# Patient Record
Sex: Male | Born: 1955
Health system: Southern US, Community
[De-identification: ages and names within clinical notes are randomized; demographics above are authoritative.]

## PROBLEM LIST (undated history)

## (undated) DIAGNOSIS — K5790 Diverticulosis of intestine, part unspecified, without perforation or abscess without bleeding: Secondary | ICD-10-CM

## (undated) DIAGNOSIS — S83231A Complex tear of medial meniscus, current injury, right knee, initial encounter: Secondary | ICD-10-CM

## (undated) DIAGNOSIS — E785 Hyperlipidemia, unspecified: Secondary | ICD-10-CM

## (undated) DIAGNOSIS — J449 Chronic obstructive pulmonary disease, unspecified: Secondary | ICD-10-CM

## (undated) DIAGNOSIS — I723 Aneurysm of iliac artery: Secondary | ICD-10-CM

## (undated) DIAGNOSIS — K5730 Diverticulosis of large intestine without perforation or abscess without bleeding: Secondary | ICD-10-CM

## (undated) DIAGNOSIS — K2270 Barrett's esophagus without dysplasia: Secondary | ICD-10-CM

## (undated) DIAGNOSIS — M5030 Other cervical disc degeneration, unspecified cervical region: Secondary | ICD-10-CM

## (undated) DIAGNOSIS — Z972 Presence of dental prosthetic device (complete) (partial): Secondary | ICD-10-CM

## (undated) DIAGNOSIS — K219 Gastro-esophageal reflux disease without esophagitis: Secondary | ICD-10-CM

## (undated) DIAGNOSIS — I739 Peripheral vascular disease, unspecified: Secondary | ICD-10-CM

## (undated) DIAGNOSIS — R0989 Other specified symptoms and signs involving the circulatory and respiratory systems: Secondary | ICD-10-CM

## (undated) DIAGNOSIS — R911 Solitary pulmonary nodule: Secondary | ICD-10-CM

## (undated) DIAGNOSIS — E881 Lipodystrophy, not elsewhere classified: Secondary | ICD-10-CM

## (undated) DIAGNOSIS — R7989 Other specified abnormal findings of blood chemistry: Secondary | ICD-10-CM

## (undated) DIAGNOSIS — K635 Polyp of colon: Secondary | ICD-10-CM

## (undated) DIAGNOSIS — I2510 Atherosclerotic heart disease of native coronary artery without angina pectoris: Secondary | ICD-10-CM

## (undated) DIAGNOSIS — N529 Male erectile dysfunction, unspecified: Secondary | ICD-10-CM

## (undated) DIAGNOSIS — E8819 Other lipodystrophy, not elsewhere classified: Secondary | ICD-10-CM

## (undated) DIAGNOSIS — Z9889 Other specified postprocedural states: Secondary | ICD-10-CM

## (undated) DIAGNOSIS — I7143 Infrarenal abdominal aortic aneurysm, without rupture: Secondary | ICD-10-CM

## (undated) DIAGNOSIS — I700 Atherosclerosis of aorta: Secondary | ICD-10-CM

## (undated) DIAGNOSIS — Z7982 Long term (current) use of aspirin: Secondary | ICD-10-CM

## (undated) DIAGNOSIS — M1990 Unspecified osteoarthritis, unspecified site: Secondary | ICD-10-CM

## (undated) DIAGNOSIS — Z860101 Personal history of adenomatous and serrated colon polyps: Secondary | ICD-10-CM

## (undated) DIAGNOSIS — Z8719 Personal history of other diseases of the digestive system: Secondary | ICD-10-CM

## (undated) DIAGNOSIS — I10 Essential (primary) hypertension: Secondary | ICD-10-CM

## (undated) DIAGNOSIS — I719 Aortic aneurysm of unspecified site, without rupture: Secondary | ICD-10-CM

## (undated) DIAGNOSIS — R112 Nausea with vomiting, unspecified: Secondary | ICD-10-CM

## (undated) DIAGNOSIS — R0609 Other forms of dyspnea: Secondary | ICD-10-CM

## (undated) DIAGNOSIS — K760 Fatty (change of) liver, not elsewhere classified: Secondary | ICD-10-CM

## (undated) DIAGNOSIS — I219 Acute myocardial infarction, unspecified: Secondary | ICD-10-CM

## (undated) DIAGNOSIS — T7840XA Allergy, unspecified, initial encounter: Secondary | ICD-10-CM

## (undated) DIAGNOSIS — S83271A Complex tear of lateral meniscus, current injury, right knee, initial encounter: Secondary | ICD-10-CM

## (undated) HISTORY — PX: GANGLION CYST EXCISION: SHX1691

## (undated) HISTORY — DX: Atherosclerotic heart disease of native coronary artery without angina pectoris: I25.10

## (undated) HISTORY — PX: APPENDECTOMY: SHX54

## (undated) HISTORY — PX: CARDIAC CATHETERIZATION: SHX172

## (undated) HISTORY — PX: CORONARY ANGIOPLASTY: SHX604

## (undated) HISTORY — PX: COLON SURGERY: SHX602

## (undated) HISTORY — DX: Other specified abnormal findings of blood chemistry: R79.89

## (undated) HISTORY — DX: Acute myocardial infarction, unspecified: I21.9

## (undated) HISTORY — PX: JOINT REPLACEMENT: SHX530

## (undated) HISTORY — PX: EYE SURGERY: SHX253

## (undated) HISTORY — PX: TONSILLECTOMY AND ADENOIDECTOMY: SUR1326

## (undated) HISTORY — DX: Allergy, unspecified, initial encounter: T78.40XA

---
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
## 2003-11-05 ENCOUNTER — Ambulatory Visit (HOSPITAL_COMMUNITY): Admission: RE | Admit: 2003-11-05 | Discharge: 2003-11-05 | Payer: Self-pay | Admitting: Neurosurgery

## 2003-11-09 DIAGNOSIS — M5080 Other cervical disc disorders, unspecified cervical region: Secondary | ICD-10-CM | POA: Insufficient documentation

## 2005-03-31 ENCOUNTER — Emergency Department: Payer: Self-pay | Admitting: Emergency Medicine

## 2005-10-08 DIAGNOSIS — E785 Hyperlipidemia, unspecified: Secondary | ICD-10-CM | POA: Insufficient documentation

## 2005-10-08 HISTORY — PX: CORONARY STENT PLACEMENT: SHX1402

## 2005-10-22 ENCOUNTER — Ambulatory Visit: Payer: Self-pay | Admitting: Chiropractic Medicine

## 2006-03-15 ENCOUNTER — Ambulatory Visit (HOSPITAL_COMMUNITY): Admission: RE | Admit: 2006-03-15 | Discharge: 2006-03-15 | Payer: Self-pay | Admitting: Neurosurgery

## 2006-08-08 DIAGNOSIS — I219 Acute myocardial infarction, unspecified: Secondary | ICD-10-CM

## 2006-08-08 HISTORY — DX: Acute myocardial infarction, unspecified: I21.9

## 2006-08-15 ENCOUNTER — Encounter: Payer: Self-pay | Admitting: Cardiovascular Disease

## 2006-08-15 DIAGNOSIS — I2119 ST elevation (STEMI) myocardial infarction involving other coronary artery of inferior wall: Secondary | ICD-10-CM

## 2006-08-15 HISTORY — DX: ST elevation (STEMI) myocardial infarction involving other coronary artery of inferior wall: I21.19

## 2006-08-22 ENCOUNTER — Encounter: Payer: Self-pay | Admitting: Cardiovascular Disease

## 2008-03-26 ENCOUNTER — Ambulatory Visit: Payer: Self-pay | Admitting: Unknown Physician Specialty

## 2009-01-31 ENCOUNTER — Encounter: Payer: Self-pay | Admitting: Cardiovascular Disease

## 2009-09-02 ENCOUNTER — Encounter: Payer: Self-pay | Admitting: Cardiovascular Disease

## 2009-09-19 ENCOUNTER — Encounter: Payer: Self-pay | Admitting: Cardiovascular Disease

## 2010-04-24 ENCOUNTER — Ambulatory Visit: Payer: Self-pay | Admitting: Gastroenterology

## 2010-04-26 LAB — PATHOLOGY REPORT

## 2010-05-10 ENCOUNTER — Other Ambulatory Visit: Payer: Self-pay | Admitting: Gastroenterology

## 2010-08-22 ENCOUNTER — Telehealth: Payer: Self-pay | Admitting: Cardiovascular Disease

## 2010-09-20 ENCOUNTER — Encounter: Payer: Self-pay | Admitting: Cardiovascular Disease

## 2010-09-27 ENCOUNTER — Ambulatory Visit: Payer: Self-pay | Admitting: Cardiovascular Disease

## 2010-09-27 ENCOUNTER — Encounter: Payer: Self-pay | Admitting: Cardiovascular Disease

## 2010-09-27 DIAGNOSIS — I700 Atherosclerosis of aorta: Secondary | ICD-10-CM | POA: Insufficient documentation

## 2010-09-27 DIAGNOSIS — R0989 Other specified symptoms and signs involving the circulatory and respiratory systems: Secondary | ICD-10-CM | POA: Insufficient documentation

## 2010-09-27 DIAGNOSIS — I2510 Atherosclerotic heart disease of native coronary artery without angina pectoris: Secondary | ICD-10-CM

## 2010-09-27 DIAGNOSIS — F17210 Nicotine dependence, cigarettes, uncomplicated: Secondary | ICD-10-CM

## 2010-09-27 DIAGNOSIS — I739 Peripheral vascular disease, unspecified: Secondary | ICD-10-CM

## 2010-09-27 DIAGNOSIS — E785 Hyperlipidemia, unspecified: Secondary | ICD-10-CM

## 2010-09-28 ENCOUNTER — Telehealth: Payer: Self-pay | Admitting: Cardiovascular Disease

## 2010-10-24 ENCOUNTER — Encounter: Payer: Self-pay | Admitting: Cardiovascular Disease

## 2010-10-24 ENCOUNTER — Ambulatory Visit: Admission: RE | Admit: 2010-10-24 | Discharge: 2010-10-24 | Payer: Self-pay | Source: Home / Self Care

## 2010-11-07 NOTE — Progress Notes (Signed)
Summary: RX  Phone Note Refill Request Call back at Home Phone [phone] Message from:  Patient on August 22, 2010 2:40 PM  Refills Requested: Medication #1:  LISINOPRIL 10 MG I TABLET I TIME A DAY [facility]  Initial call taken by: Harlon Flor,  August 22, 2010 2:40 PM    New/Updated Medications: LISINOPRIL 10 MG TABS (LISINOPRIL) one tablet once daily Prescriptions: LISINOPRIL 10 MG TABS (LISINOPRIL) one tablet once daily  #30 x 3   Entered by:   Bishop Dublin, CMA   Authorized by:   Dossie Arbour MD   Signed by:   Bishop Dublin, CMA on 08/22/2010   Method used:   Electronically to        [facility] [address] (retail)       [address]       [place], [place]  [address]       Ph: [phone]       Fax: [phone]   RxID:   [id number]

## 2010-11-09 NOTE — Assessment & Plan Note (Signed)
Summary: NP6/AMD   Visit Type:  Initial Consult Primary Antwoine Zorn:  Dr. Mila Merry  CC:  "doing well" denies chest pain and SOB.  History of Present Illness: Mr. Patrick Barrera is a 55 year old gentleman with a HTN, history of coronary artery disease, occlusion of his RCA in November 2007 with 2 bare-metal stents placed at that time also with 70% mid LAD lesion that was not intervened upon, long smoking history, hyperlipidemia who presents for routine followup.  he has not been taking simvastatin for uncertain reasons. He continues to smoke. He denies any significant chest pain or shortness of breath. He does not do any exercise.  EKG shows normal sinus rhythm with rate 60 beats per minute, no significant ST or T wave changes  Preventive Screening-Counseling & Management  Alcohol-Tobacco     Smoking Status: current  Caffeine-Diet-Exercise     Does Patient Exercise: no      Drug Use:  no.    Current Medications (verified): 1)  Lisinopril 10 Mg Tabs (Lisinopril) .... One Tablet Once Daily 2)  Plavix 75 Mg Tabs (Clopidogrel Bisulfate) .Marland Kitchen.. 1 Tablet Once Daily 3)  Nexium 40 Mg Cpdr (Esomeprazole Magnesium) .... One Tablet Once Daily 4)  Aspir-Low 81 Mg Tbec (Aspirin) .... One Tablet Once Daily 5)  Metoprolol Succinate 50 Mg Xr24h-Tab (Metoprolol Succinate) .Marland Kitchen.. 1 Tablet Daily 6)  Niacin 500 Mg Tabs (Niacin) .Marland Kitchen.. 1 Tablet Once Daily 7)  Lisinopril 10 Mg Tabs (Lisinopril) .Marland Kitchen.. 1 Tablet Once Daily 8)  Asacol Hd 800 Mg Tbec (Mesalamine) .Marland Kitchen.. 1 Tablet Three Times A Day  Allergies (verified): No Known Drug Allergies  Past History:  Past Medical History: CAD; MI with occlusion of his RCA in November 2007; stents x 2. Low cholesterol  Past Surgical History: CAD; Stents x 2 2007  Family History: Father:Heart disease-stents placed Mother:good health  Social History: Full Time Married  Tobacco Use - Yes-1 ppd Alcohol Use - yes-occasional Regular Exercise - no Drug Use - no Smoking  Status:  current Does Patient Exercise:  no Drug Use:  no  Review of Systems  The patient denies fever, weight loss, weight gain, vision loss, decreased hearing, hoarseness, chest pain, syncope, dyspnea on exertion, peripheral edema, prolonged cough, abdominal pain, incontinence, muscle weakness, depression, and enlarged lymph nodes.    Vital Signs:  Patient profile:   55 year old male Height:      70 inches Weight:      187.75 pounds BMI:     27.04 Pulse rate:   60 / minute BP sitting:   124 / 68  (left arm) Cuff size:   regular  Vitals Entered By: Lysbeth Galas CMA (September 27, 2010 3:54 PM)  Physical Exam  General:  Well developed, well nourished, in no acute distress.smells like smoke Head:  normocephalic and atraumatic Neck:  Neck supple, no JVD. No masses, thyromegaly or abnormal cervical nodes. Lungs:  Clear bilaterally to auscultation and percussion. Heart:  Non-displaced PMI, chest non-tender; regular rate and rhythm, S1, S2 without murmurs, rubs or gallops. Carotid upstroke normal, no bruit. mildly dilated abdominal aortic size, soft bruit. Pedals normal pulses. No edema, no varicosities. Abdomen:  Bowel sounds positive; abdomen soft and non-tender without masses Msk:  Back normal, normal gait. Muscle strength and tone normal. Pulses:  pulses normal in all 4 extremities Extremities:  No clubbing or cyanosis. Neurologic:  Alert and oriented x 3. Skin:  Intact without lesions or rashes. Psych:  Normal affect.   Impression & Recommendations:  Problem #  1:  CAD, NATIVE VESSEL (ICD-414.01) no symptoms of angina at this time. He is at high risk of recurrent coronary disease given that he continues to smoke, is not taking his cholesterol medication.  His updated medication list for this problem includes:    Lisinopril 10 Mg Tabs (Lisinopril) ..... One tablet once daily    Plavix 75 Mg Tabs (Clopidogrel bisulfate) .Marland Kitchen... 1 tablet once daily    Aspir-low 81 Mg Tbec  (Aspirin) ..... One tablet once daily    Metoprolol Succinate 50 Mg Xr24h-tab (Metoprolol succinate) .Marland Kitchen... 1 tablet daily    Lisinopril 10 Mg Tabs (Lisinopril) .Marland Kitchen... 1 tablet once daily  Problem # 2:  HYPERLIPIDEMIA-MIXED (ICD-272.4) We will start him on Vytorin 10/40 mg daily. Check his cholesterol in 3 months time.  The following medications were removed from the medication list:    Simvastatin 80 Mg Tabs (Simvastatin) ..... One tablet at bedtime His updated medication list for this problem includes:    Niacin 500 Mg Tabs (Niacin) .Marland Kitchen... 1 tablet once daily    Vytorin 10-40 Mg Tabs (Ezetimibe-simvastatin) .Marland Kitchen... Take one tablet by mouth daily at bedtime  Problem # 3:  SMOKER (ICD-305.1) We have counseled him on smoking. Have given him a prescription for Chantix..  Problem # 4:  ABDOMINAL BRUIT (ICD-785.9) soft abdominal bruit, oddly widened. We will order a abdominal ultrasound. He is at high risk of aneurysm given his long smoking history.  Orders: Abdominal Aorta Duplex (Abd Aorta Duplex)  Patient Instructions: 1)  Your physician recommends that you schedule a follow-up appointment in:1 year 2)  Your physician has recommended you make the following change in your medication: START Vytorin 10/40mg  once daily. 3)  Your physician has requested that you have an abdominal aorta duplex. During this test, an ultrasound is used to evaluate the aorta. Allow 30 minutes for this exam. Do not eat after midnight the day before and avoid carbonated beverages. There are no restrictions or special instructions. 4)  Your physician recommends that you return for a FASTING lipid profile: 3 months (lipid/lft's) Prescriptions: VYTORIN 10-40 MG TABS (EZETIMIBE-SIMVASTATIN) Take one tablet by mouth daily at bedtime  #30 x 6   Entered by:   Lanny Hurst RN   Authorized by:   Dossie Arbour MD   Signed by:   Lanny Hurst RN on 09/27/2010   Method used:   Electronically to        [id number]  (retail[facility]       [address]       [place], [place]  [address]       Ph: [phone] or [phone]       Fax: [phone]   RxID:   [id number] CHANTIX STARTING MONTH PAK 0.5 MG X 11 & 1 MG X 42 TABS (VARENICLINE TARTRATE) Take as directed  #1 x 0   Entered by:   Lanny Hurst RN   Authorized by:   Dossie Arbour MD   Signed by:   Lanny Hurst RN on 09/27/2010   Method used:   [facility]ically to        [id number] (retail[facility]       [address]       [place], [place]  [address]       Ph: [phone] or [phone]       Fax: [phone]  RxID:   [id number]

## 2010-11-09 NOTE — Letter (Signed)
Summary: Medical Record Release  Medical Record Release   Imported By: Harlon Flor 09/27/2010 15:59:11  _____________________________________________________________________  External Attachment:    Type:   Image     Comment:   External Document

## 2010-11-09 NOTE — Progress Notes (Signed)
Summary: RX  Phone Note Refill Request Call back at Home Phone [phone] Call back at [phone] Message from:  Patient on September 28, 2010 10:08 AM  Refills Requested: Medication #1:  METOPROLOL SUCCINATE 50 MG XR24H-TAB 1 tablet daily   Notes: 90 DAY SUPPLY MAIL ORDER  Medication #2:  PLAVIX 75 MG TABS 1 tablet once daily   Notes: 90 DAY SUPPLY MAIL ORDER  Medication #3:  NEXIUM 40 MG CPDR one tablet once daily   Notes: 90 DAY SUPPLY MAIL ORDER  Medication #4:  LISINOPRIL 10 MG TABS 1 tablet once daily   Notes: 90 DAY SUPPLY MAIL ORDER Please mail the RX to the pt's house.  Please make each RX separate.  Would like 2 30-day supplies of [facility] [place] b/c the pt is almost out.  Initial call taken by: Harlon Flor,  September 28, 2010 10:11 AM    Prescriptions: NEXIUM 40 MG CPDR (ESOMEPRAZOLE MAGNESIUM) one tablet once daily  #30 x 1   Entered by:   Bishop Dublin, CMA   Authorized by:   Dossie Arbour MD   Signed by:   Bishop Dublin, CMA on 09/28/2010   Method used:   [facility]        [id number] ([facility]       [address]       [place], [place]  [address]       Ph: [phone] or [phone]       Fax: [phone]   RxID:   [id number] LISINOPRIL 10 MG TABS (LISINOPRIL) one tablet once daily  #90 x 3   Entered by:   Bishop Dublin, CMA   Authorized by:   Dossie Arbour MD   Signed by:   Bishop Dublin, CMA on 09/28/2010   Method used:   Print then Give to Patient   RxID:   [id number] PLAVIX 75 MG TABS (CLOPIDOGREL BISULFATE) 1 tablet once daily  #90 x 3   Entered by:   Bishop Dublin, CMA   Authorized by:   Dossie Arbour MD   Signed by:   Bishop Dublin, CMA on 09/28/2010   Method used:   Print then Give to Patient   RxID:   [id number] NEXIUM 40 MG CPDR (ESOMEPRAZOLE MAGNESIUM) one tablet once daily  #90 x 3   Entered by:   Bishop Dublin, CMA   Authorized by:   Dossie Arbour MD   Signed by:    Bishop Dublin, CMA on 09/28/2010   Method used:   Print then Give to Patient   RxID:   [id number] METOPROLOL SUCCINATE 50 MG XR24H-TAB (METOPROLOL SUCCINATE) 1 tablet daily  #90 x 3   Entered by:   Bishop Dublin, CMA   Authorized by:   Dossie Arbour MD   Signed by:   Bishop Dublin, CMA on 09/28/2010   Method used:   Print then Give to Patient   RxID:   [id number]

## 2010-11-23 NOTE — Letter (Signed)
Summary: [facility] Office Note   [facility] Office Note   Imported By: Roderic Ovens 11/13/2010 10:47:01  _____________________________________________________________________  External Attachment:    Type:   Image     Comment:   External Document

## 2010-11-23 NOTE — Letter (Signed)
Summary: [facility] Office Note   [facility] Office Note   Imported By: Roderic Ovens 11/13/2010 10:46:23  _____________________________________________________________________  External Attachment:    Type:   Image     Comment:   External Document

## 2010-11-23 NOTE — Letter (Signed)
Summary: Echo Report   Echo Report   Imported By: Roderic Ovens 11/14/2010 13:04:03  _____________________________________________________________________  External Attachment:    Type:   Image     Comment:   External Document

## 2010-11-23 NOTE — Letter (Signed)
Summary: [facility]   Imported By: Roderic Ovens 11/14/2010 13:03:08  _____________________________________________________________________  External Attachment:    Type:   Image     Comment:   External Document

## 2010-11-29 NOTE — Cardiovascular Report (Signed)
[facility]: [facility]   Imported By: Marylou Mccoy 11/21/2010 14:29:28  _____________________________________________________________________  External Attachment:    Type:   Image     Comment:   External Document

## 2010-12-14 ENCOUNTER — Telehealth: Payer: Self-pay | Admitting: Cardiovascular Disease

## 2010-12-14 NOTE — Letter (Signed)
Summary: [facility]: Notice of Privacy Practices  [facility]: Notice of Privacy Practices   Imported By: Earl Many 12/08/2010 10:24:23  _____________________________________________________________________  External Attachment:    Type:   Image     Comment:   External Document

## 2010-12-14 NOTE — Progress Notes (Signed)
Summary: [facility]: Office Visit  [facility]: Office Visit   Imported By: Earl Many 12/08/2010 10:22:07  _____________________________________________________________________  External Attachment:    Type:   Image     Comment:   External Document

## 2010-12-26 NOTE — Progress Notes (Signed)
Summary: Drug interaction  Phone Note From Pharmacy Call back at [phone]   Caller: Ref # [phone] @ [facility] Call For: [facility]  Summary of Call: Drug interaction b/w Nexium and Plavix Initial call taken by: Harlon Flor,  December 14, 2010 10:23 AM  Follow-up for Phone Call        Pt's wife is calling regarding this medication.  Pt is almost out of his Nexium.  Can the pt get samples in the meantime? Follow-up by: Harlon Flor,  December 15, 2010 9:01 AM  Additional Follow-up for Phone Call Additional follow up Details #1::        LMOM TCB. Prevacid, protonix are the preferred drugs for drug interaction.  Told [facility] pharmacy patient should continue on the two medications unless Dr. Mariah Milling makes any changes. Additional Follow-up by: Bishop Dublin, CMA,  December 15, 2010 9:22 AM    Additional Follow-up for Phone Call Additional follow up Details #2::    would prefer nexium over prevacid. Stents are 57 years old, so not a problem either way,. If he is very concerned, he could change plavix to effient OK to leave as it is   Appended Document: Drug interaction patient aware and will continue with medications the way they are already.

## 2010-12-27 ENCOUNTER — Ambulatory Visit (INDEPENDENT_AMBULATORY_CARE_PROVIDER_SITE_OTHER): Payer: BC Managed Care – PPO | Admitting: *Deleted

## 2010-12-27 ENCOUNTER — Other Ambulatory Visit: Payer: Self-pay | Admitting: Cardiovascular Disease

## 2010-12-27 DIAGNOSIS — E785 Hyperlipidemia, unspecified: Secondary | ICD-10-CM

## 2010-12-27 LAB — HEPATIC FUNCTION PANEL
ALT: 26 U/L (ref 0–53)
AST: 19 U/L (ref 0–37)
Alkaline Phosphatase: 77 U/L (ref 39–117)
Indirect Bilirubin: 0.5 mg/dL (ref 0.0–0.9)
Total Protein: 7 g/dL (ref 6.0–8.3)

## 2010-12-27 LAB — LIPID PANEL
Cholesterol: 138 mg/dL (ref 0–200)
Triglycerides: 281 mg/dL — ABNORMAL HIGH (ref <150)

## 2010-12-28 ENCOUNTER — Encounter: Payer: Self-pay | Admitting: Cardiovascular Disease

## 2011-02-23 NOTE — Op Note (Signed)
NAME:  Patrick Barrera, Patrick Barrera                            ACCOUNT NO.:  [id number]   MEDICAL RECORD NO.:  [id number]                   PATIENT TYPE:  OIB   LOCATION:  3009                                 FACILITY:  [facility]   PHYSICIAN:  Kathaleen Maser. Pool, M.D.                 DATE OF BIRTH:  October 19, 1955   DATE OF PROCEDURE:  11/05/2003  DATE OF DISCHARGE:                                 OPERATIVE REPORT   SERVICE:  Neurosurgery.   PREOPERATIVE DIAGNOSIS:  Right C5-C6 and right C6-C7 spondylosis with  radiculopathy.   POSTOPERATIVE DIAGNOSIS:  Right C5-C6 and right C6-C7 spondylosis with  radiculopathy.   PROCEDURE PERFORMED:  C5-C6 and C6-C7 anterior discectomy and fusion with  allograft anterior plating.   SURGEON:  Kathaleen Maser. Pool, M.D.   ASSISTANT:  Tia Alert, MD   ANESTHESIA:  General endotracheal anesthesia.   INDICATIONS FOR PROCEDURE:  Mr. Bellucci is a 55 year old male with a history of  neck and right upper extremity pain, paresthesia, and weakness, consistent  with both right sided C6 and C7 radiculopathy.  The patient has failed  conservative management.  Workup demonstrates evidence of significant  spondylosis with superimposed disc herniation at C5-C6 and C6-C7 causing  marked compression of the exiting C6 and C7 nerve roots respectively.  The  patient has failed conservative management and it was decided to proceed  with C5-C6 and C6-C7 anterior cervical discectomy and fusion with allograft  and anterior plating.   PROCEDURE:  The patient was brought to the operating room and placed on the  operating table in the supine position.  After an adequate level of  anesthesia was achieved, the patient was positioned supine with his neck  slightly extended and held in place with the halter traction.  The patient's  anterior cervical region was prepped and draped sterilely.  A 10 blade was  used to make a linear incision overlying the C6 vertebral level.  This was  carried down sharply  to the platysma.  The platysma was divided vertically  and dissection proceeded along the medial border of the sternocleidomastoid  muscle and carotid sheath.  The trachea and esophagus were mobilized and  retracted towards the left.  The prevertebral fascia was stripped off the  anterior spinal column.  The longus colli muscles were elevated bilaterally  using electrocautery.  Deep self-retaining retractor was placed.  Interoperative fluoroscopy was used and the level was confirmed.   The disc space at C5-C6 and C6-C7 were then incised with a 15 blade.  A wide  disc space plane was then achieved at both levels using pituitary rongeurs,  forward and back Carlens curets, Kerrison rongeurs, and a high speed drill.  All elements of the disc were removed down to the posterior annulus.  Starting first at C5-C6, the microscope was brought onto the field and used  throughout the remainder of the discectomy.  The remaining aspects of the  annulus and osteophytes were removed down to the level of the posterior  longitudinal ligament.  The posterior longitudinal ligament was then  elevated and resected in a piecemeal fashion using Kerrison rongeurs.  The  underlying thecal sac was identified.  A wide central decompression was then  performed by under cutting the bodies of C5 and C6.  Decompression then  proceeded into each neural foramen.  Wide anterior foraminotomies were then  performed along the course of the exiting C6 nerve roots bilaterally.  All  elements of the disc herniation and spondylitic protrusions were removed  along this way.  At this point, a very thorough decompression had been  achieved.  There was no evidence of any compression of the thecal sac or  nerve roots.  The wound was then irrigated with antibiotic solution.  The  decompression was then completed at C6-C7 in a similar fashion, again,  without complications.   The wound was then irrigated with antibiotic solution one  final time.  A 7  mm patellar wedge allograft was then impacted into place and recessed  approximately 1 mm from the anterior cortical margin at both levels.  A 45  mm Atlantis anterior cervical plate was then placed over the C5, C6, and C7  levels.  This was then attached under fluoroscopic guidance with 14 mm  variable angle screws, two each at all three levels.  Final images revealed  good position of the bone grafts and hardware.  The wound was then irrigated  with antibiotic solution and then closed in typical fashion.  Steri-Strips  and sterile dressing were applied.  There were no complications.  The  patient tolerated the procedure well and he was returned to the recovery  room postoperatively.                                               Henry A. Pool, M.D.    HAP/MEDQ  D:  11/05/2003  T:  11/05/2003  Job:  [id number]

## 2011-02-23 NOTE — Op Note (Signed)
NAME:  Patrick Barrera, Patrick Barrera                  ACCOUNT NO.:  [id number]   MEDICAL RECORD NO.:  [id number]          PATIENT TYPE:  AMB   LOCATION:  SDS                          FACILITY:  [facility]   PHYSICIAN:  Henry A. Pool, M.D.    DATE OF BIRTH:  09/13/1956   DATE OF PROCEDURE:  03/15/2006  DATE OF DISCHARGE:                                 OPERATIVE REPORT   PREOPERATIVE DIAGNOSIS:  Left L4-L5 stenosis and left L5-S1 stenosis.   POSTOPERATIVE DIAGNOSIS:  Left L4-L5 stenosis and left L5-S1 stenosis.   PROCEDURE NOTE:  Left L4-L5 decompressive laminotomy and foraminotomy, left  L5-S1 decompressive laminotomy and foraminotomy.   SURGEON:  Kathaleen Maser. Pool, M.D.   ASSISTANT:  Donalee Citrin, M.D.   ANESTHESIA:  General endotracheal anesthesia.   PREMEDICATION:  [name] is a 55 year old male with a history of left lower  extremity pain, paresthesias, and weakness consistent with a left-sided L5  radiculopathy with some elements of a left-sided S1 radiculopathy, as well.  Workup demonstrates evidence of significant spondylosis with stenosis at L4-  L5 and L5-S1, worse on the left side.  The patient presents now for two  level decompressive laminotomy and foraminotomy in hopes of improving his  symptoms.   OPERATIVE NOTE:  The patient was taken to the operating room and placed on  the operating table in the supine position. After anesthesia was achieved,  the patient was positioned prone onto Wilson frame and appropriately padded  for an operation in the lumbar region.  The patient was prepped and draped  sterilely.  A 10 blade was used to make a linear skin incision overlying the  L4-L5 interspace.  This carried sharply down sharply in the midline.  A  subperiosteal dissection was then performed exposing the lamina and facet  joints of L4, L5, and S1.  Deep self-retaining retractor was placed.  Interoperative x-ray was taken and levels were confirmed.  Decompressive  laminotomies were then performed  using a high-speed drill and Kerrison  rongeurs to remove the inferior aspect of the lamina of L4, medial aspect of  the L4-L5 facet joint, the entire left hemilamina of L5, and the superior  aspect of the lamina of S1.  The ligamentum flavum was then elevated and  resected in a piecemeal fashion using Kerrison rongeurs at both levels.  The  underlying thecal sac and exiting L5 and S1 nerve roots were identified.  Wide decompressive foraminotomy was then performed along the course of the  exiting L5 and S1 nerve roots using the operating microscope for  microdissection.  The L5 nerve root was decompressed distally into its  foramen.  A blunt probe was passed along the course of the exiting nerve  roots.  There is no evidence of any residual compression.  There was some  osteophytic spurring coming from the disk space at L5-S1 which was resected  with an osteophyte remover.  The disk space itself was very collapsed and no  aggressive diskectomy was performed at this level.  The wound was then  irrigated with antibiotic solution.  Gelfoam was  placed topically for  hemostasis which was found to be good.  The microscope and retractor system  were removed.  Hemostasis was obtained in the muscle with  electrocautery.  The wound was then closed in layers with Vicryl sutures.  Steri-Strips and sterile dressing were applied.  There were no apparent  complications.  The patient did well and he returns to the recovery room  postoperatively.           ______________________________  Kathaleen Maser Pool, M.D.     HAP/MEDQ  D:  03/15/2006  T:  03/15/2006  Job:  [id number]

## 2011-04-24 ENCOUNTER — Encounter: Payer: Self-pay | Admitting: Cardiovascular Disease

## 2011-06-05 ENCOUNTER — Telehealth: Payer: Self-pay

## 2011-06-05 MED ORDER — CLOPIDOGREL BISULFATE 75 MG PO TABS: 75.0000 mg | ORAL_TABLET | Freq: Every day | ORAL | Status: DC

## 2011-06-05 NOTE — Telephone Encounter (Signed)
Needs a refill sent to [facility] mail order for plavix 75 mg take one tablet daily.

## 2011-07-17 ENCOUNTER — Other Ambulatory Visit: Payer: Self-pay | Admitting: Cardiovascular Disease

## 2011-07-17 ENCOUNTER — Telehealth: Payer: Self-pay | Admitting: *Deleted

## 2011-07-17 DIAGNOSIS — E785 Hyperlipidemia, unspecified: Secondary | ICD-10-CM

## 2011-07-17 NOTE — Telephone Encounter (Signed)
Pt's wife calling to see if pt needs refill of his simvastatin since he will f/u with TG in 09/2011 and will need lipid panel. (Pt has not been taking simva or vytorin for >3 months due to leg cramps and I assume wants him on med for lab results only). I advised that pt have labs done now, since he has been off of chol med, and if he cannot tolerate statin, we will have TG review and see what med/dose would be recommended. Pt has never tried pravastatin. Pt will call back to schedule labs for next week.

## 2011-07-26 ENCOUNTER — Ambulatory Visit (INDEPENDENT_AMBULATORY_CARE_PROVIDER_SITE_OTHER): Payer: BC Managed Care – PPO | Admitting: *Deleted

## 2011-07-26 ENCOUNTER — Ambulatory Visit: Payer: BC Managed Care – PPO | Admitting: *Deleted

## 2011-07-26 DIAGNOSIS — E785 Hyperlipidemia, unspecified: Secondary | ICD-10-CM

## 2011-07-27 LAB — HEPATIC FUNCTION PANEL
ALT: 25 IU/L (ref 0–55)
AST: 23 IU/L (ref 0–40)
Total Bilirubin: 0.4 mg/dL (ref 0.0–1.2)

## 2011-07-27 LAB — LIPID PANEL
Cholesterol, Total: 231 mg/dL — ABNORMAL HIGH (ref 100–199)
HDL: 25 mg/dL — ABNORMAL LOW (ref >39)
Triglycerides: 299 mg/dL — ABNORMAL HIGH (ref 0–149)

## 2011-08-02 ENCOUNTER — Encounter: Payer: Self-pay | Admitting: Cardiovascular Disease

## 2011-08-08 ENCOUNTER — Telehealth: Payer: Self-pay | Admitting: *Deleted

## 2011-08-08 NOTE — Telephone Encounter (Signed)
Pt calling to get his lab results from 10/18, I saw a letter was sent but he has not received yet. Pt needs to start chol med in addition to his niacin 500mg . Pt has taken crestor, simva, and lipitor which all caused leg cramps. Do you want him to start pravastatin? This seem to be the only med he has not taken. Pt does have f/u with you 09/2011.

## 2011-08-09 NOTE — Telephone Encounter (Signed)
Would start fluvastatin 40 mg daily Probably not stong enough, but we can add zetia later

## 2011-08-13 MED ORDER — FLUVASTATIN SODIUM 40 MG PO CAPS: 40.0000 mg | ORAL_CAPSULE | Freq: Every day | ORAL | Status: DC

## 2011-08-13 NOTE — Telephone Encounter (Signed)
Attempted to contact pt, LMOM TCB.  

## 2011-08-13 NOTE — Telephone Encounter (Signed)
Spoke to pt, notified of below. Fluvastatin 40mg  sent in, pt will start taking and he has f/u with TG 09/10/11. Pt knows to call if he has any side effects.

## 2011-09-10 ENCOUNTER — Ambulatory Visit (INDEPENDENT_AMBULATORY_CARE_PROVIDER_SITE_OTHER): Payer: BC Managed Care – PPO | Admitting: Cardiovascular Disease

## 2011-09-10 ENCOUNTER — Encounter: Payer: Self-pay | Admitting: Cardiovascular Disease

## 2011-09-10 VITALS — BP 122/80 | HR 68 | Ht 70.0 in | Wt 197.0 lb

## 2011-09-10 DIAGNOSIS — I10 Essential (primary) hypertension: Secondary | ICD-10-CM

## 2011-09-10 DIAGNOSIS — F17200 Nicotine dependence, unspecified, uncomplicated: Secondary | ICD-10-CM

## 2011-09-10 DIAGNOSIS — I2510 Atherosclerotic heart disease of native coronary artery without angina pectoris: Secondary | ICD-10-CM

## 2011-09-10 DIAGNOSIS — I700 Atherosclerosis of aorta: Secondary | ICD-10-CM

## 2011-09-10 DIAGNOSIS — IMO0001 Reserved for inherently not codable concepts without codable children: Secondary | ICD-10-CM

## 2011-09-10 DIAGNOSIS — E785 Hyperlipidemia, unspecified: Secondary | ICD-10-CM

## 2011-09-10 MED ORDER — FLUVASTATIN SODIUM 40 MG PO CAPS: 40.0000 mg | ORAL_CAPSULE | Freq: Every day | ORAL | Status: DC

## 2011-09-10 MED ORDER — VARENICLINE TARTRATE 0.5 MG PO TABS: 0.5000 mg | ORAL_TABLET | Freq: Two times a day (BID) | ORAL | Status: AC

## 2011-09-10 NOTE — Assessment & Plan Note (Signed)
Poorly controlled cholesterol secondary to statin intolerance. He is so far tolerating fluvastatin.

## 2011-09-10 NOTE — Progress Notes (Signed)
Patient ID: Patrick Barrera, male    DOB: 1956/05/17, 55 y.o.   MRN: [id number]  HPI Comments: Patrick Barrera is a 55 year old gentleman with a HTN, history of coronary artery disease, occlusion of his RCA in November 2007 with 2 bare-metal stents placed at that time also with 70% mid LAD lesion that was not intervened upon, long smoking history, hyperlipidemia who presents for routine followup.  He reports that he continues to smoke. He is trying to quit. He denies any chest pain, shortness of breath. He does have some mild swelling in his legs particularly after a long day of standing. He was not able to tolerate simvastatin and went back on a statin, fluvastatin, in the past 5 weeks. Weight has been relatively stable He does not do any exercise.   EKG shows normal sinus rhythm with rate 70 beats per minute, no significant ST or T wave changes     Outpatient Encounter Prescriptions as of 09/10/2011  Medication Sig Dispense Refill  . aspirin 81 MG EC tablet Take 81 mg by mouth daily.        . clopidogrel (PLAVIX) 75 MG tablet Take 1 tablet (75 mg total) by mouth daily.  90 tablet  3  . esomeprazole (NEXIUM) 40 MG capsule Take 40 mg by mouth daily.        . fluvastatin (LESCOL) 40 MG capsule Take 1 capsule (40 mg total) by mouth at bedtime.  90 capsule  4  . lisinopril (PRINIVIL,ZESTRIL) 10 MG tablet Take 10 mg by mouth daily.        . metoprolol (TOPROL-XL) 50 MG 24 hr tablet Take 50 mg by mouth daily.        . niacin 500 MG tablet Take 500 mg by mouth daily.          Review of Systems  Constitutional: Negative.   HENT: Negative.   Eyes: Negative.   Respiratory: Negative.   Cardiovascular: Negative.   Gastrointestinal: Negative.   Musculoskeletal: Negative.   Skin: Negative.   Neurological: Negative.   Hematological: Negative.   Psychiatric/Behavioral: Negative.   All other systems reviewed and are negative.    BP 122/80  Pulse 68  Ht 5\' 10"  (1.778 m)  Wt 197 lb (89.359 kg)  BMI 28.27  kg/m2   Physical Exam  Nursing note and vitals reviewed. Constitutional: He is oriented to person, place, and time. He appears well-developed and well-nourished.  HENT:  Head: Normocephalic.  Nose: Nose normal.  Mouth/Throat: Oropharynx is clear and moist.  Eyes: Conjunctivae are normal. Pupils are equal, round, and reactive to light.  Neck: Normal range of motion. Neck supple. No JVD present.  Cardiovascular: Normal rate, regular rhythm, S1 normal, S2 normal, normal heart sounds and intact distal pulses.  Exam reveals no gallop and no friction rub.   No murmur heard.      Mild nonpitting edema worse on the right than the left  Pulmonary/Chest: Effort normal and breath sounds normal. No respiratory distress. He has no wheezes. He has no rales. He exhibits no tenderness.  Abdominal: Soft. Bowel sounds are normal. He exhibits no distension. There is no tenderness.  Musculoskeletal: Normal range of motion. He exhibits no edema and no tenderness.  Lymphadenopathy:    He has no cervical adenopathy.  Neurological: He is alert and oriented to person, place, and time. Coordination normal.  Skin: Skin is warm and dry. No rash noted. No erythema.  Psychiatric: He has a normal mood and affect. His  behavior is normal. Judgment and thought content normal.           Assessment and Plan

## 2011-09-10 NOTE — Assessment & Plan Note (Signed)
We have encouraged him to continue to work on weaning his cigarettes and smoking cessation. He will continue to work on this and  wants chantix.

## 2011-09-10 NOTE — Assessment & Plan Note (Signed)
Calcification noted on the balls of the aorta in January 2012. Continue smoking cessation and aggressive cholesterol management.

## 2011-09-10 NOTE — Assessment & Plan Note (Signed)
Blood pressure is well controlled on today's visit. No changes made to the medications. 

## 2011-09-10 NOTE — Patient Instructions (Addendum)
You are doing well. No medication changes were made.  We should check a lipid panel in 3 months.   Please call us if you have new issues that need to be addressed before your next appt.  The office will contact you for a follow up Appt. In 12 months

## 2011-09-10 NOTE — Assessment & Plan Note (Signed)
Currently with no symptoms of angina. No further workup at this time. Continue current medication regimen. 

## 2011-09-13 ENCOUNTER — Telehealth: Payer: Self-pay

## 2011-09-13 DIAGNOSIS — E785 Hyperlipidemia, unspecified: Secondary | ICD-10-CM

## 2011-09-13 DIAGNOSIS — I2510 Atherosclerotic heart disease of native coronary artery without angina pectoris: Secondary | ICD-10-CM

## 2011-09-13 MED ORDER — LISINOPRIL 10 MG PO TABS: 10.0000 mg | ORAL_TABLET | Freq: Every day | ORAL | Status: DC

## 2011-09-13 MED ORDER — ESOMEPRAZOLE MAGNESIUM 40 MG PO CPDR: 40.0000 mg | DELAYED_RELEASE_CAPSULE | Freq: Every day | ORAL | Status: DC

## 2011-09-13 MED ORDER — FLUVASTATIN SODIUM 40 MG PO CAPS: 40.0000 mg | ORAL_CAPSULE | Freq: Every day | ORAL | Status: DC

## 2011-09-13 MED ORDER — METOPROLOL SUCCINATE ER 50 MG PO TB24: 50.0000 mg | ORAL_TABLET | Freq: Every day | ORAL | Status: DC

## 2011-09-13 NOTE — Telephone Encounter (Signed)
Needed a 90 day supply sent to [facility] mail order for fluvastatin 40 mg.

## 2011-09-13 NOTE — Telephone Encounter (Signed)
Attempted to print Rx.

## 2011-12-10 ENCOUNTER — Other Ambulatory Visit: Payer: BC Managed Care – PPO

## 2011-12-10 ENCOUNTER — Other Ambulatory Visit: Payer: Self-pay | Admitting: Cardiovascular Disease

## 2011-12-12 LAB — LIPID PANEL W/O CHOL/HDL RATIO
LDL Calculated: 113 mg/dL — ABNORMAL HIGH (ref 0–99)
Triglycerides: 356 mg/dL — ABNORMAL HIGH (ref 0–149)

## 2011-12-28 ENCOUNTER — Telehealth: Payer: Self-pay | Admitting: *Deleted

## 2011-12-28 ENCOUNTER — Telehealth: Payer: Self-pay | Admitting: Cardiovascular Disease

## 2011-12-28 NOTE — Telephone Encounter (Signed)
Spoke with pt re/results. Pt willing to try other med but cost may be an issue. Pt was intolerant to Vytorin/ leg cramps. Please advise strength of crestor and lab redraw date.

## 2011-12-28 NOTE — Telephone Encounter (Signed)
Message copied by Antony Odea on Fri Dec 28, 2011  2:33 PM ------      Message from: Phoebe Sharps      Created: Fri Dec 28, 2011  1:56 PM       Cholesterol is too high. Slightly better than 5 months ago.      Done from 231 to 206      Goal is 150.      He is high risk of further coronary artery disease given smoking and high cholesterol.      Cholesterol was perfect one year ago on Vytorin.      As you want [place] to retry Vytorin, changed to high-dose Lipitor, Try Crestor?

## 2011-12-28 NOTE — Telephone Encounter (Signed)
Lets try Lipitor first Prescription for 80 mg daily, started on 40 mg for several weeks first before titrating upwards If able to tolerate 80 mg daily, check cholesterol in 3 months time with LFTs.

## 2011-12-28 NOTE — Telephone Encounter (Signed)
Error

## 2011-12-31 ENCOUNTER — Telehealth: Payer: Self-pay | Admitting: *Deleted

## 2011-12-31 DIAGNOSIS — E785 Hyperlipidemia, unspecified: Secondary | ICD-10-CM

## 2011-12-31 MED ORDER — ATORVASTATIN CALCIUM 80 MG PO TABS: ORAL_TABLET | ORAL | Status: DC

## 2011-12-31 NOTE — Telephone Encounter (Signed)
Pt agreeable to starting new med/ labs scheduled

## 2011-12-31 NOTE — Telephone Encounter (Signed)
Patient called no answer.LMTC. 

## 2012-01-14 NOTE — Progress Notes (Signed)
Would try atorvastatin 80 mg daily (already on med list?) Or crestor 40 mg daily

## 2012-01-15 ENCOUNTER — Telehealth: Payer: Self-pay | Admitting: *Deleted

## 2012-01-15 NOTE — Progress Notes (Signed)
lmtcb

## 2012-01-15 NOTE — Telephone Encounter (Signed)
PER PT  IS TOLERATING  ATORVASTATIN 80 MG AT THIS TIME, IS SCHEDULED  TO RETURN EARLY June FOR REPEAT LIPID AND LIVER  WILL CONT WITH  MED AND WILL CALL IF HAS PROBLEMS IN FUTURE. PER PT  LDL  FROM  FINGER STICK   WAS 47  DOWN FROM 113  FROM PREVIOUS LABS .Patrick Barrera

## 2012-01-15 NOTE — Telephone Encounter (Signed)
Message copied by Antony Odea on Tue Jan 15, 2012  3:52 PM ------      Message from: Phoebe Sharps      Created: Mon Jan 14, 2012 10:43 PM       Would try atorvastatin 80 mg dail (already on medication lsit?)      Or try crestor 40 mg daily      thx      Tim                  ----- Message -----         From: Antony Odea, RN         Sent: 12/28/2011   2:50 PM           To: Antonieta Iba, MD            Dr Mariah Milling to advise new medication to try, pt willing to try crestor if able to afford. Will send to dr Mariah Milling to advise strength

## 2012-01-15 NOTE — Telephone Encounter (Signed)
LMTCB

## 2012-01-15 NOTE — Telephone Encounter (Signed)
Fu call °Pt returning your call  °

## 2012-02-26 ENCOUNTER — Telehealth: Payer: Self-pay | Admitting: Cardiovascular Disease

## 2012-02-26 NOTE — Telephone Encounter (Signed)
Wife called me back. Says she spoke with [employer] and they advised her to contact pt.'s place of employment/HR and see about getting a letter sent to approve a 1x extension for statin med until he gets labs done. Wife will call me if employer needs [place] to send letter, etc. In the mean time, pt will try to come in soon for chol/labs.

## 2012-02-26 NOTE — Telephone Encounter (Signed)
Wife says she is on phone with [employer] re: meds at this time and will call me right back.

## 2012-02-26 NOTE — Telephone Encounter (Signed)
Pt wife called stating that pt only has 5 days left of atorvastin and wants to know if he is to cont this or if it will depend on his labs. Pt wife states that a 30 day supply at local pharmacy is $130. Pt would like to discuss this with Dr Mariah Milling

## 2012-03-14 ENCOUNTER — Ambulatory Visit (INDEPENDENT_AMBULATORY_CARE_PROVIDER_SITE_OTHER): Payer: BC Managed Care – PPO

## 2012-03-14 DIAGNOSIS — E785 Hyperlipidemia, unspecified: Secondary | ICD-10-CM

## 2012-03-14 DIAGNOSIS — Z79899 Other long term (current) drug therapy: Secondary | ICD-10-CM

## 2012-03-15 LAB — HEPATIC FUNCTION PANEL
ALT: 26 IU/L (ref 0–44)
AST: 18 IU/L (ref 0–40)
Alkaline Phosphatase: 77 IU/L (ref 25–150)
Bilirubin, Direct: 0.13 mg/dL (ref 0.00–0.40)
Total Bilirubin: 0.4 mg/dL (ref 0.0–1.2)

## 2012-03-15 LAB — BASIC METABOLIC PANEL
CO2: 19 mmol/L — ABNORMAL LOW (ref 20–32)
Calcium: 8.6 mg/dL — ABNORMAL LOW (ref 8.7–10.2)
Chloride: 108 mmol/L (ref 97–108)
Potassium: 4.2 mmol/L (ref 3.5–5.2)
Sodium: 138 mmol/L (ref 134–144)

## 2012-03-15 LAB — LIPID PANEL
Cholesterol, Total: 119 mg/dL (ref 100–199)
LDL Calculated: 64 mg/dL (ref 0–99)
Triglycerides: 150 mg/dL — ABNORMAL HIGH (ref 0–149)

## 2012-03-31 ENCOUNTER — Other Ambulatory Visit: Payer: Self-pay | Admitting: Cardiovascular Disease

## 2012-03-31 DIAGNOSIS — E785 Hyperlipidemia, unspecified: Secondary | ICD-10-CM

## 2012-03-31 MED ORDER — ATORVASTATIN CALCIUM 80 MG PO TABS: ORAL_TABLET | ORAL | Status: DC

## 2012-03-31 NOTE — Telephone Encounter (Signed)
Needs a script also called in to [facility] in [place].

## 2012-03-31 NOTE — Telephone Encounter (Signed)
Refilled Atorvastatin.

## 2012-05-12 ENCOUNTER — Telehealth: Payer: Self-pay

## 2012-05-12 MED ORDER — CLOPIDOGREL BISULFATE 75 MG PO TABS: 75.0000 mg | ORAL_TABLET | Freq: Every day | ORAL | Status: DC

## 2012-05-12 NOTE — Telephone Encounter (Signed)
Pt's wife says he needs refill of SL NTG 0.4 mg tabs i do not see record of pt taking these. Is is ok to prescribe?

## 2012-05-13 ENCOUNTER — Other Ambulatory Visit: Payer: Self-pay

## 2012-05-13 MED ORDER — NITROGLYCERIN 0.4 MG SL SUBL: 0.4000 mg | SUBLINGUAL_TABLET | SUBLINGUAL | Status: DC | PRN

## 2012-05-13 NOTE — Telephone Encounter (Signed)
Okay to refill nitroglycerin If he is having more chest pain, he should let us know

## 2012-09-10 ENCOUNTER — Encounter: Payer: Self-pay | Admitting: Cardiovascular Disease

## 2012-09-10 ENCOUNTER — Ambulatory Visit (INDEPENDENT_AMBULATORY_CARE_PROVIDER_SITE_OTHER): Payer: BC Managed Care – PPO | Admitting: Cardiovascular Disease

## 2012-09-10 VITALS — BP 140/78 | HR 60 | Ht 70.0 in | Wt 197.0 lb

## 2012-09-10 DIAGNOSIS — I2510 Atherosclerotic heart disease of native coronary artery without angina pectoris: Secondary | ICD-10-CM

## 2012-09-10 DIAGNOSIS — E785 Hyperlipidemia, unspecified: Secondary | ICD-10-CM

## 2012-09-10 DIAGNOSIS — F17200 Nicotine dependence, unspecified, uncomplicated: Secondary | ICD-10-CM

## 2012-09-10 DIAGNOSIS — I10 Essential (primary) hypertension: Secondary | ICD-10-CM

## 2012-09-10 MED ORDER — CLOPIDOGREL BISULFATE 75 MG PO TABS: 75.0000 mg | ORAL_TABLET | Freq: Every day | ORAL | Status: DC

## 2012-09-10 MED ORDER — METOPROLOL SUCCINATE ER 50 MG PO TB24: 50.0000 mg | ORAL_TABLET | Freq: Every day | ORAL | Status: DC

## 2012-09-10 MED ORDER — LISINOPRIL 10 MG PO TABS: 10.0000 mg | ORAL_TABLET | Freq: Every day | ORAL | Status: DC

## 2012-09-10 MED ORDER — ATORVASTATIN CALCIUM 80 MG PO TABS: 80.0000 mg | ORAL_TABLET | Freq: Every day | ORAL | Status: DC

## 2012-09-10 NOTE — Progress Notes (Signed)
Patient ID: Patrick Barrera, male    DOB: 02-13-1956, 56 y.o.   MRN: [id number]  HPI Comments: Patrick Barrera is a 56 year old gentleman with a HTN, history of coronary artery disease, occlusion of his RCA in November 2007 with 2 bare-metal stents placed at that time also with 70% mid LAD lesion that was not intervened upon, long smoking history who stopped in October 2013, hyperlipidemia who presents for routine followup.  He denies any chest pain, shortness of breath. He reports that he is able to tolerate generic Lipitor 80 mg daily. Rarely cramping.  Weight has been relatively stable. He does not do any exercise. He spends time with his grandkids   EKG shows normal sinus rhythm with rate 60 beats per minute, no significant ST or T wave changes     Outpatient Encounter Prescriptions as of 09/10/2012  Medication Sig Dispense Refill  . aspirin 81 MG EC tablet Take 81 mg by mouth daily.        Marland Kitchen atorvastatin (LIPITOR) 80 MG tablet Take 1 tablet (80 mg total) by mouth daily.  90 tablet  3  . clopidogrel (PLAVIX) 75 MG tablet Take 1 tablet (75 mg total) by mouth daily.  90 tablet  3  . esomeprazole (NEXIUM) 40 MG capsule Take 1 capsule (40 mg total) by mouth daily.  90 capsule  3  . lisinopril (PRINIVIL,ZESTRIL) 10 MG tablet Take 1 tablet (10 mg total) by mouth daily.  90 tablet  3  . metoprolol succinate (TOPROL-XL) 50 MG 24 hr tablet Take 1 tablet (50 mg total) by mouth daily.  90 tablet  3  . niacin 500 MG tablet Take 500 mg by mouth as needed.       . nitroGLYCERIN (NITROSTAT) 0.4 MG SL tablet Place 1 tablet (0.4 mg total) under the tongue every 5 (five) minutes as needed for chest pain.  25 tablet  3    Review of Systems  Constitutional: Negative.   HENT: Negative.   Eyes: Negative.   Respiratory: Negative.   Cardiovascular: Negative.   Gastrointestinal: Negative.   Musculoskeletal: Negative.   Skin: Negative.   Neurological: Negative.   Hematological: Negative.   Psychiatric/Behavioral:  Negative.   All other systems reviewed and are negative.    BP 140/78  Pulse 60  Ht 5\' 10"  (1.778 m)  Wt 197 lb (89.359 kg)  BMI 28.27 kg/m2  Physical Exam  Nursing note and vitals reviewed. Constitutional: He is oriented to person, place, and time. He appears well-developed and well-nourished.  HENT:  Head: Normocephalic.  Nose: Nose normal.  Mouth/Throat: Oropharynx is clear and moist.  Eyes: Conjunctivae normal are normal. Pupils are equal, round, and reactive to light.  Neck: Normal range of motion. Neck supple. No JVD present.  Cardiovascular: Normal rate, regular rhythm, S1 normal, S2 normal, normal heart sounds and intact distal pulses.  Exam reveals no gallop and no friction rub.   No murmur heard. Pulmonary/Chest: Effort normal and breath sounds normal. No respiratory distress. He has no wheezes. He has no rales. He exhibits no tenderness.  Abdominal: Soft. Bowel sounds are normal. He exhibits no distension. There is no tenderness.  Musculoskeletal: Normal range of motion. He exhibits no edema and no tenderness.  Lymphadenopathy:    He has no cervical adenopathy.  Neurological: He is alert and oriented to person, place, and time. Coordination normal.  Skin: Skin is warm and dry. No rash noted. No erythema.  Psychiatric: He has a normal mood and affect.  His behavior is normal. Judgment and thought content normal.           Assessment and Plan

## 2012-09-10 NOTE — Assessment & Plan Note (Signed)
Blood pressure is well controlled on today's visit. No changes made to the medications. 

## 2012-09-10 NOTE — Assessment & Plan Note (Signed)
Cholesterol is at goal on the current lipid regimen. No changes to the medications were made.  

## 2012-09-10 NOTE — Patient Instructions (Addendum)
You are doing well. No medication changes were made.  Please call us if you have new issues that need to be addressed before your next appt.  Your physician wants you to follow-up in: 12 months.  You will receive a reminder letter in the mail two months in advance. If you don't receive a letter, please call our office to schedule the follow-up appointment. 

## 2012-09-10 NOTE — Assessment & Plan Note (Signed)
Stop smoking 6 weeks ago. We have congratulated him on this.

## 2012-09-10 NOTE — Assessment & Plan Note (Signed)
Currently with no symptoms of angina. No further workup at this time. Continue current medication regimen. 

## 2012-09-25 ENCOUNTER — Other Ambulatory Visit: Payer: Self-pay | Admitting: Cardiovascular Disease

## 2012-09-26 ENCOUNTER — Other Ambulatory Visit: Payer: Self-pay

## 2012-09-26 ENCOUNTER — Other Ambulatory Visit: Payer: Self-pay | Admitting: *Deleted

## 2012-09-26 MED ORDER — LISINOPRIL 10 MG PO TABS: 10.0000 mg | ORAL_TABLET | Freq: Every day | ORAL | Status: DC

## 2012-09-26 NOTE — Telephone Encounter (Signed)
Refill sent for 90 day supply at [facility]

## 2012-09-26 NOTE — Telephone Encounter (Signed)
Refilled Lisinopril. 

## 2012-11-26 ENCOUNTER — Other Ambulatory Visit: Payer: Self-pay | Admitting: *Deleted

## 2012-11-26 MED ORDER — CLOPIDOGREL BISULFATE 75 MG PO TABS: 75.0000 mg | ORAL_TABLET | Freq: Every day | ORAL | Status: DC

## 2012-11-26 MED ORDER — METOPROLOL SUCCINATE ER 50 MG PO TB24: 50.0000 mg | ORAL_TABLET | Freq: Every day | ORAL | Status: DC

## 2012-11-26 NOTE — Telephone Encounter (Signed)
Refilled Plavix and [facility]

## 2012-12-05 ENCOUNTER — Other Ambulatory Visit: Payer: Self-pay | Admitting: Cardiovascular Disease

## 2013-05-19 ENCOUNTER — Telehealth: Payer: Self-pay | Admitting: *Deleted

## 2013-05-19 NOTE — Telephone Encounter (Signed)
Protocol for tooth extraction for this patient is on Plavix. Dentist needs to know how long to be off meds prior to procedure. Please advise.

## 2013-05-20 NOTE — Telephone Encounter (Signed)
Per Dr. Mariah Milling, stay on ASA, may hold Plavix 5 days before procedure, told Arline Asp to inform pt that there is some risk associated with stopping blood thinners.

## 2013-06-01 ENCOUNTER — Other Ambulatory Visit: Payer: Self-pay | Admitting: Cardiovascular Disease

## 2013-09-06 ENCOUNTER — Other Ambulatory Visit: Payer: Self-pay | Admitting: Cardiovascular Disease

## 2013-09-07 ENCOUNTER — Other Ambulatory Visit: Payer: Self-pay | Admitting: *Deleted

## 2013-09-07 MED ORDER — METOPROLOL SUCCINATE ER 50 MG PO TB24: ORAL_TABLET | ORAL | Status: DC

## 2013-09-07 MED ORDER — ATORVASTATIN CALCIUM 80 MG PO TABS: 80.0000 mg | ORAL_TABLET | Freq: Every day | ORAL | Status: DC

## 2013-09-07 NOTE — Telephone Encounter (Signed)
Requested Prescriptions   Signed Prescriptions Disp Refills  . atorvastatin (LIPITOR) 80 MG tablet 90 tablet 3    Sig: Take 1 tablet (80 mg total) by mouth daily.    Authorizing Provider: Antonieta Iba    Ordering User: Shawnie Dapper, MARINA C  . metoprolol succinate (TOPROL-XL) 50 MG 24 hr tablet 90 tablet 2    Sig: TAKE 1 TABLET DAILY    Authorizing Provider: Antonieta Iba    Ordering User: Kendrick Fries

## 2013-09-08 ENCOUNTER — Other Ambulatory Visit: Payer: Self-pay | Admitting: *Deleted

## 2013-09-08 MED ORDER — METOPROLOL SUCCINATE ER 50 MG PO TB24: ORAL_TABLET | ORAL | Status: DC

## 2013-09-08 MED ORDER — LISINOPRIL 10 MG PO TABS: 10.0000 mg | ORAL_TABLET | Freq: Every day | ORAL | Status: DC

## 2013-09-08 MED ORDER — ATORVASTATIN CALCIUM 80 MG PO TABS: ORAL_TABLET | ORAL | Status: DC

## 2013-09-08 NOTE — Telephone Encounter (Signed)
Requested Prescriptions   Signed Prescriptions Disp Refills  . metoprolol succinate (TOPROL-XL) 50 MG 24 hr tablet 90 tablet 0    Sig: TAKE 1 TABLET DAILY    Authorizing Provider: Antonieta Iba    Ordering User: Lorelai Huyser C  . atorvastatin (LIPITOR) 80 MG tablet 90 tablet 0    Sig: TAKE 1 TABLET DAILY    Authorizing Provider: Antonieta Iba    Ordering User: Celestino Ackerman C  . lisinopril (PRINIVIL,ZESTRIL) 10 MG tablet 90 tablet 0    Sig: Take 1 tablet (10 mg total) by mouth daily.    Authorizing Provider: Antonieta Iba    Ordering User: Kendrick Fries

## 2013-09-14 ENCOUNTER — Ambulatory Visit (INDEPENDENT_AMBULATORY_CARE_PROVIDER_SITE_OTHER): Payer: BC Managed Care – PPO | Admitting: Cardiovascular Disease

## 2013-09-14 ENCOUNTER — Encounter: Payer: Self-pay | Admitting: Cardiovascular Disease

## 2013-09-14 VITALS — BP 122/84 | HR 68 | Ht 70.0 in | Wt 203.5 lb

## 2013-09-14 DIAGNOSIS — I2510 Atherosclerotic heart disease of native coronary artery without angina pectoris: Secondary | ICD-10-CM

## 2013-09-14 DIAGNOSIS — I10 Essential (primary) hypertension: Secondary | ICD-10-CM

## 2013-09-14 DIAGNOSIS — E785 Hyperlipidemia, unspecified: Secondary | ICD-10-CM

## 2013-09-14 DIAGNOSIS — F17200 Nicotine dependence, unspecified, uncomplicated: Secondary | ICD-10-CM

## 2013-09-14 MED ORDER — NITROGLYCERIN 0.4 MG SL SUBL: 0.4000 mg | SUBLINGUAL_TABLET | SUBLINGUAL | Status: DC | PRN

## 2013-09-14 NOTE — Assessment & Plan Note (Signed)
Cholesterol is at goal on the current lipid regimen. No changes to the medications were made.  

## 2013-09-14 NOTE — Assessment & Plan Note (Signed)
Complimented him on his stopped smoking in October 2014

## 2013-09-14 NOTE — Progress Notes (Signed)
Patient ID: Patrick Barrera, male    DOB: 1955-12-27, 57 y.o.   MRN: [id number]  HPI Comments: Mr. Patrick Barrera is a 57 year old gentleman with HTN, history of coronary artery disease, occlusion of his RCA in November 2007 with 2 bare-metal stents placed at that time also with 70% mid LAD lesion that was not intervened upon, long smoking history who stopped in October 2013, hyperlipidemia who presents for routine followup.  He denies any  shortness of breath. He does report having some burning in his left chest that comes on with heavy exertion that lasts for several seconds at a time. He has had this for many months. No significant change in intensity or frequency . He is uncertain if it is cardiac. No recent nitroglycerin use . He carries this with him daily . He reports that he is able to tolerate generic Lipitor 80 mg daily. Rarely cramping.  Weight has been relatively stable. He does not do any exercise. He spends time with his grandkids   EKG shows normal sinus rhythm with rate 68 beats per minute, no significant ST or T wave changes     Outpatient Encounter Prescriptions as of 09/14/2013  Medication Sig  . aspirin 81 MG EC tablet Take 81 mg by mouth daily.    Marland Kitchen atorvastatin (LIPITOR) 80 MG tablet Take 1 tablet (80 mg total) by mouth daily.  . clopidogrel (PLAVIX) 75 MG tablet Take 1 tablet (75 mg total) by mouth daily.  Marland Kitchen lisinopril (PRINIVIL,ZESTRIL) 10 MG tablet Take 1 tablet (10 mg total) by mouth daily.  . metoprolol succinate (TOPROL-XL) 50 MG 24 hr tablet TAKE 1 TABLET DAILY  . nitroGLYCERIN (NITROSTAT) 0.4 MG SL tablet Place 1 tablet (0.4 mg total) under the tongue every 5 (five) minutes as needed for chest pain.    Review of Systems  Constitutional: Negative.   HENT: Negative.   Eyes: Negative.   Respiratory: Negative.   Cardiovascular: Negative.   Gastrointestinal: Negative.   Endocrine: Negative.   Musculoskeletal: Negative.   Skin: Negative.   Allergic/Immunologic: Negative.    Neurological: Negative.   Hematological: Negative.   Psychiatric/Behavioral: Negative.   All other systems reviewed and are negative.    BP 122/84  Pulse 68  Ht 5\' 10"  (1.778 m)  Wt 203 lb 8 oz (92.307 kg)  BMI 29.20 kg/m2  Physical Exam  Nursing note and vitals reviewed. Constitutional: He is oriented to person, place, and time. He appears well-developed and well-nourished.  HENT:  Head: Normocephalic.  Nose: Nose normal.  Mouth/Throat: Oropharynx is clear and moist.  Eyes: Conjunctivae are normal. Pupils are equal, round, and reactive to light.  Neck: Normal range of motion. Neck supple. No JVD present.  Cardiovascular: Normal rate, regular rhythm, S1 normal, S2 normal, normal heart sounds and intact distal pulses.  Exam reveals no gallop and no friction rub.   No murmur heard. Pulmonary/Chest: Effort normal and breath sounds normal. No respiratory distress. He has no wheezes. He has no rales. He exhibits no tenderness.  Abdominal: Soft. Bowel sounds are normal. He exhibits no distension. There is no tenderness.  Musculoskeletal: Normal range of motion. He exhibits no edema and no tenderness.  Lymphadenopathy:    He has no cervical adenopathy.  Neurological: He is alert and oriented to person, place, and time. Coordination normal.  Skin: Skin is warm and dry. No rash noted. No erythema.  Psychiatric: He has a normal mood and affect. His behavior is normal. Judgment and thought content normal.  Assessment and Plan

## 2013-09-14 NOTE — Assessment & Plan Note (Signed)
Blood pressure is well controlled on today's visit. No changes made to the medications. 

## 2013-09-14 NOTE — Patient Instructions (Signed)
You are doing well. No medication changes were made.  Please call us if you have new issues that need to be addressed before your next appt.  Your physician wants you to follow-up in: 6 months.  You will receive a reminder letter in the mail two months in advance. If you don't receive a letter, please call our office to schedule the follow-up appointment.   

## 2013-09-14 NOTE — Assessment & Plan Note (Addendum)
Some burning in his left chest with exertion lasting for several seconds at a time. He reports it is stable, likely stable angina. We did discuss catheterization, stress testing. He prefers medical management at this time. We have strongly recommended he call us if symptoms become more frequent, last longer or more intense

## 2013-12-07 ENCOUNTER — Other Ambulatory Visit: Payer: Self-pay | Admitting: Cardiovascular Disease

## 2013-12-07 ENCOUNTER — Other Ambulatory Visit: Payer: Self-pay

## 2013-12-07 MED ORDER — LISINOPRIL 10 MG PO TABS: 10.0000 mg | ORAL_TABLET | Freq: Every day | ORAL | Status: DC

## 2013-12-07 NOTE — Telephone Encounter (Signed)
Refill sent for lisinopril 10 mg

## 2014-02-21 ENCOUNTER — Emergency Department: Payer: Self-pay | Admitting: Emergency Medicine

## 2014-02-22 DIAGNOSIS — T2060XA Corrosion of second degree of head, face, and neck, unspecified site, initial encounter: Secondary | ICD-10-CM | POA: Insufficient documentation

## 2014-03-10 ENCOUNTER — Other Ambulatory Visit: Payer: Self-pay | Admitting: *Deleted

## 2014-03-10 MED ORDER — METOPROLOL SUCCINATE ER 50 MG PO TB24: ORAL_TABLET | ORAL | Status: DC

## 2014-03-10 MED ORDER — CLOPIDOGREL BISULFATE 75 MG PO TABS: 75.0000 mg | ORAL_TABLET | Freq: Every day | ORAL | Status: DC

## 2014-03-10 NOTE — Telephone Encounter (Signed)
Requested Prescriptions   Signed Prescriptions Disp Refills  . metoprolol succinate (TOPROL-XL) 50 MG 24 hr tablet 90 tablet 3    Sig: TAKE 1 TABLET DAILY    Authorizing Provider: GOLLAN, TIMOTHY J    Ordering User: LOPEZ, MARINA C  . clopidogrel (PLAVIX) 75 MG tablet 90 tablet 3    Sig: Take 1 tablet (75 mg total) by mouth daily.    Authorizing Provider: GOLLAN, TIMOTHY J    Ordering User: LOPEZ, MARINA C    

## 2014-03-10 NOTE — Telephone Encounter (Signed)
Requested Prescriptions   Signed Prescriptions Disp Refills  . metoprolol succinate (TOPROL-XL) 50 MG 24 hr tablet 90 tablet 3    Sig: TAKE 1 TABLET DAILY    Authorizing Provider: Minna Merritts    Ordering User: Charlen Bakula C  . clopidogrel (PLAVIX) 75 MG tablet 90 tablet 3    Sig: Take 1 tablet (75 mg total) by mouth daily.    Authorizing Provider: Minna Merritts    Ordering User: Britt Bottom

## 2014-03-16 LAB — HEPATIC FUNCTION PANEL
ALK PHOS: 82 U/L (ref 25–125)
ALT: 24 U/L (ref 10–40)
AST: 18 U/L (ref 14–40)
BILIRUBIN, TOTAL: 0.8 mg/dL

## 2014-03-16 LAB — BASIC METABOLIC PANEL
BUN: 20 mg/dL (ref 4–21)
CREATININE: 0.8 mg/dL (ref 0.6–1.3)
GLUCOSE: 99 mg/dL
POTASSIUM: 4.7 mmol/L (ref 3.4–5.3)
Sodium: 139 mmol/L (ref 137–147)

## 2014-03-16 LAB — CBC AND DIFFERENTIAL
HCT: 40 % — AB (ref 41–53)
Hemoglobin: 13.6 g/dL (ref 13.5–17.5)
Platelets: 215 10*3/uL (ref 150–399)
WBC: 6.1 10*3/mL

## 2014-03-16 LAB — PSA: PSA: 0.6

## 2014-03-16 LAB — LIPID PANEL
Cholesterol: 128 mg/dL (ref 0–200)
HDL: 30 mg/dL — AB (ref 35–70)
LDL Cholesterol: 69 mg/dL
Triglycerides: 143 mg/dL (ref 40–160)

## 2014-03-22 ENCOUNTER — Encounter: Payer: Self-pay | Admitting: Cardiovascular Disease

## 2014-03-22 ENCOUNTER — Ambulatory Visit (INDEPENDENT_AMBULATORY_CARE_PROVIDER_SITE_OTHER): Payer: BC Managed Care – PPO | Admitting: Cardiovascular Disease

## 2014-03-22 VITALS — BP 110/72 | HR 64 | Ht 69.5 in | Wt 197.2 lb

## 2014-03-22 DIAGNOSIS — E785 Hyperlipidemia, unspecified: Secondary | ICD-10-CM

## 2014-03-22 DIAGNOSIS — I2510 Atherosclerotic heart disease of native coronary artery without angina pectoris: Secondary | ICD-10-CM

## 2014-03-22 DIAGNOSIS — I10 Essential (primary) hypertension: Secondary | ICD-10-CM

## 2014-03-22 NOTE — Assessment & Plan Note (Signed)
Blood pressure is well controlled on today's visit. No changes made to the medications. 

## 2014-03-22 NOTE — Assessment & Plan Note (Signed)
Cholesterol is at goal on the current lipid regimen. No changes to the medications were made.  

## 2014-03-22 NOTE — Progress Notes (Addendum)
   Patient ID: Patrick Barrera, male    DOB: [birth year], 58 y.o.   MRN: [id number]  HPI Comments: Mr. Smola is a 58 year old gentleman with HTN, history of coronary artery disease, occlusion of his RCA in November 2007 with 2 bare-metal stents placed at that time also with 70% mid LAD lesion that was not intervened upon, long smoking history who stopped in October 2013, hyperlipidemia who presents for routine followup.  He denies any  shortness of breath. He reports no significant chest pain with exertion.  No recent nitroglycerin use . He carries this with him daily . He reports that he is able to tolerate generic Lipitor 20 mg daily.  Weight has been relatively stable. He does not do any exercise. He spends time with his grandkids   EKG shows normal sinus rhythm with rate 64 beats per minute, no significant ST or T wave changes Initial EKG with lead placement issues, repeat was performed     Outpatient Encounter Prescriptions as of 03/22/2014  Medication Sig  . aspirin 81 MG EC tablet Take 81 mg by mouth daily.    Marland Kitchen atorvastatin (LIPITOR) 80 MG tablet Take 1 tablet (80 mg total) by mouth daily.  . clopidogrel (PLAVIX) 75 MG tablet Take 1 tablet (75 mg total) by mouth daily.  Marland Kitchen lisinopril (PRINIVIL,ZESTRIL) 10 MG tablet Take 1 tablet (10 mg total) by mouth daily.  . metoprolol succinate (TOPROL-XL) 50 MG 24 hr tablet TAKE 1 TABLET DAILY  . nitroGLYCERIN (NITROSTAT) 0.4 MG SL tablet Place 1 tablet (0.4 mg total) under the tongue every 5 (five) minutes as needed for chest pain.     Review of Systems  Constitutional: Negative.   HENT: Negative.   Eyes: Negative.   Respiratory: Negative.   Cardiovascular: Negative.   Gastrointestinal: Negative.   Endocrine: Negative.   Musculoskeletal: Negative.   Skin: Negative.   Allergic/Immunologic: Negative.   Neurological: Negative.   Hematological: Negative.   Psychiatric/Behavioral: Negative.   All other systems reviewed and are negative.   BP  110/72  Pulse 64  Ht 5' 9.5" (1.765 m)  Wt 197 lb 4 oz (89.472 kg)  BMI 28.72 kg/m2  Physical Exam  Nursing note and vitals reviewed. Constitutional: He is oriented to person, place, and time. He appears well-developed and well-nourished.  HENT:  Head: Normocephalic.  Nose: Nose normal.  Mouth/Throat: Oropharynx is clear and moist.  Eyes: Conjunctivae are normal. Pupils are equal, round, and reactive to light.  Neck: Normal range of motion. Neck supple. No JVD present.  Cardiovascular: Normal rate, regular rhythm, S1 normal, S2 normal, normal heart sounds and intact distal pulses.  Exam reveals no gallop and no friction rub.   No murmur heard. Pulmonary/Chest: Effort normal and breath sounds normal. No respiratory distress. He has no wheezes. He has no rales. He exhibits no tenderness.  Abdominal: Soft. Bowel sounds are normal. He exhibits no distension. There is no tenderness.  Musculoskeletal: Normal range of motion. He exhibits no edema and no tenderness.  Lymphadenopathy:    He has no cervical adenopathy.  Neurological: He is alert and oriented to person, place, and time. Coordination normal.  Skin: Skin is warm and dry. No rash noted. No erythema.  Psychiatric: He has a normal mood and affect. His behavior is normal. Judgment and thought content normal.      Assessment and Plan

## 2014-03-22 NOTE — Assessment & Plan Note (Signed)
Currently with no symptoms of angina. No further workup at this time. Continue current medication regimen. 

## 2014-03-22 NOTE — Patient Instructions (Signed)
You are doing well. No medication changes were made.  Please call us if you have new issues that need to be addressed before your next appt.  Your physician wants you to follow-up in: 12 months.  You will receive a reminder letter in the mail two months in advance. If you don't receive a letter, please call our office to schedule the follow-up appointment. 

## 2014-04-12 ENCOUNTER — Telehealth: Payer: Self-pay

## 2014-04-12 NOTE — Telephone Encounter (Signed)
Cardiac clearance for upper endoscopy w/ Dr. Vira Agar on 04/28/14 faxed on 04/12/14. Per Patrick Bayley, NP: "He may come off of ASA/Plavix 5 days prior to EGD if that is required from your standpoint". Pt has been cleared for procedure.

## 2014-04-23 HISTORY — PX: UPPER GI ENDOSCOPY: SHX6162

## 2014-04-28 ENCOUNTER — Ambulatory Visit: Payer: Self-pay | Admitting: Unknown Physician Specialty

## 2014-04-28 LAB — HM COLONOSCOPY

## 2014-04-29 LAB — PATHOLOGY REPORT

## 2014-05-26 ENCOUNTER — Other Ambulatory Visit: Payer: Self-pay | Admitting: *Deleted

## 2014-05-26 ENCOUNTER — Other Ambulatory Visit: Payer: Self-pay | Admitting: Cardiovascular Disease

## 2014-05-26 MED ORDER — METOPROLOL SUCCINATE ER 50 MG PO TB24: ORAL_TABLET | ORAL | Status: DC

## 2014-05-26 MED ORDER — CLOPIDOGREL BISULFATE 75 MG PO TABS: 75.0000 mg | ORAL_TABLET | Freq: Every day | ORAL | Status: DC

## 2014-05-26 NOTE — Telephone Encounter (Signed)
Please call pt wife when these are sent

## 2014-09-24 ENCOUNTER — Other Ambulatory Visit: Payer: Self-pay | Admitting: Cardiovascular Disease

## 2014-12-17 ENCOUNTER — Other Ambulatory Visit: Payer: Self-pay | Admitting: Cardiovascular Disease

## 2015-01-29 ENCOUNTER — Other Ambulatory Visit: Payer: Self-pay | Admitting: Cardiovascular Disease

## 2015-03-22 ENCOUNTER — Encounter: Payer: Self-pay | Admitting: Cardiovascular Disease

## 2015-03-22 ENCOUNTER — Ambulatory Visit (INDEPENDENT_AMBULATORY_CARE_PROVIDER_SITE_OTHER): Payer: BLUE CROSS/BLUE SHIELD | Admitting: Cardiovascular Disease

## 2015-03-22 VITALS — BP 126/80 | HR 62 | Ht 70.0 in | Wt 209.0 lb

## 2015-03-22 DIAGNOSIS — I2510 Atherosclerotic heart disease of native coronary artery without angina pectoris: Secondary | ICD-10-CM | POA: Diagnosis not present

## 2015-03-22 DIAGNOSIS — I10 Essential (primary) hypertension: Secondary | ICD-10-CM

## 2015-03-22 DIAGNOSIS — E785 Hyperlipidemia, unspecified: Secondary | ICD-10-CM | POA: Diagnosis not present

## 2015-03-22 DIAGNOSIS — R079 Chest pain, unspecified: Secondary | ICD-10-CM

## 2015-03-22 NOTE — Assessment & Plan Note (Signed)
Blood pressure is well controlled on today's visit. No changes made to the medications. 

## 2015-03-22 NOTE — Assessment & Plan Note (Signed)
Cholesterol is at goal on the current lipid regimen. No changes to the medications were made.  

## 2015-03-22 NOTE — Assessment & Plan Note (Signed)
Long discussion today concerning his chest burning episodes. These are rare. Unclear if these are stable angina or secondary to COPD. If symptoms get worse, recommended we do a trial of albuterol inhaler. Could start isosorbide mononitrate 30 mg daily If it is more cardiac in nature, would recommend catheterization if symptoms get worse

## 2015-03-22 NOTE — Progress Notes (Signed)
Patient ID: Patrick Barrera, male    DOB: [birth year], 59 y.o.   MRN: [id number]  HPI Comments: Patrick Barrera is a 59 year old gentleman with HTN, history of coronary artery disease, occlusion of his RCA in November 2007 with 2 bare-metal stents placed at that time also with 70% mid LAD lesion that was not intervened upon, long smoking history who stopped in October 2013, hyperlipidemia who presents for routine followup of his coronary artery disease  In follow-up, he reports having very occasional chest burning with heavy exertion. This has happened only several times Never had to take nitroglycerin. No regular exercise program Otherwise feels well with no complaints. No escalation of his chest burning symptoms, only occasional He wonders if it could be from COPD able to tolerate generic Lipitor 20 mg daily.  Weight has been relatively stable. He spends time with his grandkids  EKG on today's visit shows normal sinus rhythm with rate 62 bpm, no significant ST or T wave changes     Allergies  Allergen Reactions  . Codeine Nausea Only  . Ezetimibe-Simvastatin Other (See Comments)    Leg cramps    Current Outpatient Prescriptions on File Prior to Visit  Medication Sig Dispense Refill  . aspirin 81 MG EC tablet Take 81 mg by mouth daily.      Marland Kitchen atorvastatin (LIPITOR) 80 MG tablet TAKE 1 TABLET DAILY 90 tablet 3  . clopidogrel (PLAVIX) 75 MG tablet TAKE 1 TABLET DAILY 90 tablet 3  . lisinopril (PRINIVIL,ZESTRIL) 10 MG tablet Take 1 tablet (10 mg total) by mouth daily. 90 tablet 3  . metoprolol succinate (TOPROL-XL) 50 MG 24 hr tablet TAKE 1 TABLET DAILY 90 tablet 3  . NITROSTAT 0.4 MG SL tablet DISSOLVE 1 TABLET UNDER TONGUE EVERY 5 MINUTES IF NEEDED CHEST PAIN.CALL 911 IF NO RELIEF AFTER 3 DOSES 25 tablet 1   No current facility-administered medications on file prior to visit.    Past Medical History  Diagnosis Date  . Coronary artery disease   . Myocardial infarction 11/07    With  occlusion of RCA, stents x2  . Cholesterol blood lowered     Past Surgical History  Procedure Laterality Date  . Coronary stent placement  2007    x2    Social History  reports that he has quit smoking. His smoking use included Cigarettes. He has a 45 pack-year smoking history. He does not have any smokeless tobacco history on file. He reports that he drinks about 0.5 oz of alcohol per week. He reports that he does not use illicit drugs.  Family History family history includes Heart disease in his father.   Review of Systems  Constitutional: Negative.   Respiratory: Negative.   Cardiovascular: Negative.   Gastrointestinal: Negative.   Musculoskeletal: Negative.   Skin: Negative.   Neurological: Negative.   Hematological: Negative.   Psychiatric/Behavioral: Negative.   All other systems reviewed and are negative.   BP 126/80 mmHg  Pulse 62  Ht 5\' 10"  (1.778 m)  Wt 209 lb (94.802 kg)  BMI 29.99 kg/m2  Physical Exam  Constitutional: He is oriented to person, place, and time. He appears well-developed and well-nourished.  HENT:  Head: Normocephalic.  Nose: Nose normal.  Mouth/Throat: Oropharynx is clear and moist.  Eyes: Conjunctivae are normal. Pupils are equal, round, and reactive to light.  Neck: Normal range of motion. Neck supple. No JVD present.  Cardiovascular: Normal rate, regular rhythm, S1 normal, S2 normal, normal heart sounds and intact  distal pulses.  Exam reveals no gallop and no friction rub.   No murmur heard. Pulmonary/Chest: Effort normal and breath sounds normal. No respiratory distress. He has no wheezes. He has no rales. He exhibits no tenderness.  Abdominal: Soft. Bowel sounds are normal. He exhibits no distension. There is no tenderness.  Musculoskeletal: Normal range of motion. He exhibits no edema or tenderness.  Lymphadenopathy:    He has no cervical adenopathy.  Neurological: He is alert and oriented to person, place, and time. Coordination  normal.  Skin: Skin is warm and dry. No rash noted. No erythema.  Psychiatric: He has a normal mood and affect. His behavior is normal. Judgment and thought content normal.      Assessment and Plan   Nursing note and vitals reviewed.

## 2015-03-22 NOTE — Patient Instructions (Addendum)
You are doing well. No medication changes were made.  Please call the office if you develop worsening chest pain/burning  Please call us if you have new issues that need to be addressed before your next appt.  Your physician wants you to follow-up in: 12 months.  You will receive a reminder letter in the mail two months in advance. If you don't receive a letter, please call our office to schedule the follow-up appointment.

## 2015-06-27 ENCOUNTER — Other Ambulatory Visit: Payer: Self-pay | Admitting: Cardiovascular Disease

## 2015-09-05 ENCOUNTER — Other Ambulatory Visit: Payer: Self-pay | Admitting: Cardiovascular Disease

## 2015-12-05 ENCOUNTER — Other Ambulatory Visit: Payer: Self-pay | Admitting: Cardiovascular Disease

## 2015-12-05 NOTE — Telephone Encounter (Signed)
Requested Prescriptions   Pending Prescriptions Disp Refills  . lisinopril (PRINIVIL,ZESTRIL) 10 MG tablet [Pharmacy Med Name: LISINOPRIL TABS 10MG ] 90 tablet 3    Sig: TAKE 1 TABLET DAILY (PATIENT NEEDS TO CONTACT OFFICE TO SCHEDULE APPOINTMENT AND REFILLS)

## 2015-12-06 DIAGNOSIS — I252 Old myocardial infarction: Secondary | ICD-10-CM | POA: Insufficient documentation

## 2015-12-06 DIAGNOSIS — K5730 Diverticulosis of large intestine without perforation or abscess without bleeding: Secondary | ICD-10-CM | POA: Insufficient documentation

## 2015-12-06 DIAGNOSIS — K2270 Barrett's esophagus without dysplasia: Secondary | ICD-10-CM | POA: Insufficient documentation

## 2015-12-09 ENCOUNTER — Encounter: Payer: Self-pay | Admitting: Family Medicine

## 2015-12-09 ENCOUNTER — Ambulatory Visit (INDEPENDENT_AMBULATORY_CARE_PROVIDER_SITE_OTHER): Payer: BLUE CROSS/BLUE SHIELD | Admitting: Family Medicine

## 2015-12-09 VITALS — BP 120/68 | HR 71 | Temp 98.2°F | Resp 16 | Ht 69.0 in | Wt 214.0 lb

## 2015-12-09 DIAGNOSIS — R5383 Other fatigue: Secondary | ICD-10-CM | POA: Diagnosis not present

## 2015-12-09 DIAGNOSIS — Z23 Encounter for immunization: Secondary | ICD-10-CM

## 2015-12-09 DIAGNOSIS — Z0000 Encounter for general adult medical examination without abnormal findings: Secondary | ICD-10-CM | POA: Diagnosis not present

## 2015-12-09 NOTE — Progress Notes (Signed)
Patient: Patrick Barrera, Male    DOB: Oct 25, 1955, 60 y.o.   MRN: [id number] Visit Date: 12/09/2015  Today's Provider: Lelon Huh, MD   Chief Complaint  Patient presents with  . Annual Exam  . Hyperlipidemia  . Gastroesophageal Reflux   Subjective:    Annual physical exam Patrick Barrera is a 60 y.o. male who presents today for health maintenance and complete physical. He feels well. He reports exercising no. He reports he is sleeping well.  -----------------------------------------------------------------   Follow-up for CAD from 03/12/2014; no changes. Followed by Dr. Rockey Situ. Follow-up for barrett esophagus from 03/12/2014; referred to GI to follow-up EGD. Follow-up for acid reflux from 03/12/2014; no changes.   He states he has felt increasingly fatigued over the last couple of years. Will occasionally doze off while watching TV, but otherwise is not sleepy during the day. He does take OTC sleep aid every night, otherwise he wakes frequently and cannot get back to sleep.      Lipid/Cholesterol, Follow-up:   Last seen for this 03/12/2014.  Management since that visit includes; labs, no changes.  Last Lipid Panel:    Component Value Date/Time   CHOL 128 03/16/2014   CHOL 119 03/14/2012 0834   TRIG 143 03/16/2014   HDL 30* 03/16/2014   HDL 25* 03/14/2012 0834   CHOLHDL 4.8 03/14/2012 0834   CHOLHDL 5.1 12/27/2010 1700   VLDL 56* 12/27/2010 1700   LDLCALC 69 03/16/2014   LDLCALC 64 03/14/2012 0834    He reports good compliance with treatment. He is not having side effects. none  Wt Readings from Last 3 Encounters:  12/09/15 214 lb (97.07 kg)  03/12/14 200 lb (90.719 kg)  03/22/15 209 lb (94.802 kg)    -----------------------------------------------------------------------    Review of Systems  Constitutional: Positive for fatigue.  HENT: Negative.   Eyes: Negative.   Respiratory: Negative.   Cardiovascular: Negative.   Gastrointestinal: Negative.    Endocrine: Negative.   Genitourinary: Negative.   Musculoskeletal: Negative.   Skin: Negative.   Allergic/Immunologic: Negative.   Neurological: Negative.   Hematological: Negative.   Psychiatric/Behavioral: Negative.     Social History      He  reports that he has quit smoking. His smoking use included Cigarettes. He has a 45 pack-year smoking history. He does not have any smokeless tobacco history on file. He reports that he drinks about 0.5 oz of alcohol per week. He reports that he does not use illicit drugs.       Social History   Social History  . Marital Status: Married    Spouse Name: N/A  . Number of Children: N/A  . Years of Education: N/A   Occupational History  . MACHINE OPERATOR     Full time   Social History Main Topics  . Smoking status: Former Smoker -- 1.00 packs/day for 45 years    Types: Cigarettes  . Smokeless tobacco: None  . Alcohol Use: 0.5 oz/week    1 Standard drinks or equivalent per week     Comment: Occasional  . Drug Use: No  . Sexual Activity: Not Asked   Other Topics Concern  . None   Social History Narrative   No regular exercise.    Past Medical History  Diagnosis Date  . Coronary artery disease   . Myocardial infarction ([place]) 11/07    With occlusion of RCA, stents x2  . Cholesterol blood lowered      Patient Active  Problem List   Diagnosis Date Noted  . Barrett esophagus 12/06/2015  . Diverticulosis of colon 12/06/2015  . H/O acute myocardial infarction 12/06/2015  . Corrosion of second degree of head and neck 02/22/2014  . HTN (hypertension) 09/10/2011  . Hyperlipidemia 09/27/2010  . SMOKER 09/27/2010  . CAD, NATIVE VESSEL 09/27/2010  . ATHEROSCLEROSIS OF AORTA 09/27/2010  . UNSPECIFIED PERIPHERAL VASCULAR DISEASE 09/27/2010  . ABDOMINAL BRUIT 09/27/2010  . Allergic rhinitis 11/30/2009  . ED (erectile dysfunction) of organic origin 11/19/2008  . 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 Acid reflux 10/08/2004  .  Calcification of intervertebral cartilage or disc of cervical region 11/09/2003    Past Surgical History  Procedure Laterality Date  . Coronary stent placement  2007    x2  . Appendectomy    . Tonsillectomy and adenoidectomy    . Upper gi endoscopy  04/23/14    barretts esophagus, repeat 04/2017    Family History        Family Status  Relation Status Death Age  . Father Deceased 75  . Mother Alive     Good health  . Sister Alive   . Brother Deceased     due to MVA        His family history includes Heart disease in his father; Pulmonary fibrosis in his father.    Allergies  Allergen Reactions  . Atorvastatin   . Codeine Nausea Only  . Ezetimibe-Simvastatin Other (See Comments)    Leg cramps    Previous Medications   ASPIRIN 81 MG EC TABLET    Take 81 mg by mouth daily.     ATORVASTATIN (LIPITOR) 80 MG TABLET    TAKE 1 TABLET DAILY   CLOPIDOGREL (PLAVIX) 75 MG TABLET    TAKE 1 TABLET DAILY   LANSOPRAZOLE (PREVACID) 30 MG CAPSULE    Take by mouth.   LISINOPRIL (PRINIVIL,ZESTRIL) 10 MG TABLET    Take 1 tablet (10 mg total) by mouth daily.   METOPROLOL SUCCINATE (TOPROL-XL) 50 MG 24 HR TABLET    TAKE 1 TABLET DAILY   NITROSTAT 0.4 MG SL TABLET    DISSOLVE 1 TABLET UNDER TONGUE EVERY 5 MINUTES IF NEEDED CHEST PAIN.CALL 911 IF NO RELIEF AFTER 3 DOSES    Patient Care Team: Birdie Sons, MD as PCP - General (Family Medicine)     Objective:   Vitals: BP 120/68 mmHg  Pulse 71  Temp(Src) 98.2 F (36.8 C) (Oral)  Resp 16  Ht 5\' 9"  (1.753 m)  Wt 214 lb (97.07 kg)  BMI 31.59 kg/m2  SpO2 95%   Physical Exam   General Appearance:    Alert, cooperative, no distress, appears stated age  Head:    Normocephalic, without obvious abnormality, atraumatic  Eyes:    PERRL, conjunctiva/corneas clear, EOM's intact, fundi    benign, both eyes       Ears:    Normal TM's and external ear canals, both ears  Nose:   Nares normal, septum midline, mucosa normal, no drainage   or  sinus tenderness  Throat:   Lips, mucosa, and tongue normal; teeth and gums normal  Neck:   Supple, symmetrical, trachea midline, no adenopathy;       thyroid:  No enlargement/tenderness/nodules; no carotid   bruit or JVD  Back:     Symmetric, no curvature, ROM normal, no CVA tenderness  Lungs:     Clear to auscultation bilaterally, respirations unlabored  Chest wall:    No tenderness or deformity  Heart:    Regular rate and rhythm, S1 and S2 normal, no murmur, rub   or gallop  Abdomen:     Soft, non-tender, bowel sounds active all four quadrants,    no masses, no organomegaly  Genitalia:    deferred  Rectal:    deferred  Extremities:   Extremities normal, atraumatic, no cyanosis or edema  Pulses:   2+ and symmetric all extremities  Skin:   Skin color, texture, turgor normal, no rashes or lesions  Lymph nodes:   Cervical, supraclavicular, and axillary nodes normal  Neurologic:   CNII-XII intact. Normal strength, sensation and reflexes      throughout    Epworth Sleep Scale = 5  Depression Screen PHQ 2/9 Scores 12/09/2015  PHQ - 2 Score 0  PHQ- 9 Score 1      Assessment & Plan:     Routine Health Maintenance and Physical Exam  Exercise Activities and Dietary recommendations Goals    None      Immunization History  Administered Date(s) Administered  . Tdap 12/05/2007    Health Maintenance  Topic Date Due  . HIV Screening  05/22/1969  . ZOSTAVAX  05/22/2014  . INFLUENZA VACCINE  06/07/2016 (Originally 05/09/2015)  . TETANUS/TDAP  12/04/2017  . COLONOSCOPY  04/28/2024  . Hepatitis C Screening  Completed      Discussed health benefits of physical activity, and encouraged him to engage in regular exercise appropriate for his age and condition.    --------------------------------------------------------------------  1. Annual physical exam Generally doing well. Continue yearly follow up with Dr. Rockey Situ.  - Lipid panel - Comprehensive metabolic panel - PSA  2.  Other fatigue  - T4 AND TSH - CBC - Testosterone,Free and Total

## 2015-12-09 NOTE — Patient Instructions (Signed)
Screening for lung cancer is recommended for people between 55 and 62 years of age who have smoked at 1 pack per day or more for at least 30 years. Please call our office at [phone] to schedule a low dose CT lung scan for lung cancer screening.   

## 2015-12-15 ENCOUNTER — Telehealth: Payer: Self-pay

## 2015-12-15 LAB — CBC
HEMATOCRIT: 41.6 % (ref 37.5–51.0)
HEMOGLOBIN: 13.9 g/dL (ref 12.6–17.7)
MCH: 32.6 pg (ref 26.6–33.0)
MCHC: 33.4 g/dL (ref 31.5–35.7)
MCV: 98 fL — AB (ref 79–97)
Platelets: 239 10*3/uL (ref 150–379)
RBC: 4.26 x10E6/uL (ref 4.14–5.80)
RDW: 13.3 % (ref 12.3–15.4)
WBC: 5.8 10*3/uL (ref 3.4–10.8)

## 2015-12-15 LAB — COMPREHENSIVE METABOLIC PANEL
A/G RATIO: 1.5 (ref 1.1–2.5)
ALT: 29 IU/L (ref 0–44)
AST: 21 IU/L (ref 0–40)
Albumin: 4.1 g/dL (ref 3.6–4.8)
Alkaline Phosphatase: 82 IU/L (ref 39–117)
BUN/Creatinine Ratio: 20 (ref 10–22)
BUN: 18 mg/dL (ref 8–27)
Bilirubin Total: 0.6 mg/dL (ref 0.0–1.2)
CALCIUM: 9 mg/dL (ref 8.6–10.2)
CHLORIDE: 102 mmol/L (ref 96–106)
CO2: 21 mmol/L (ref 18–29)
Creatinine, Ser: 0.89 mg/dL (ref 0.76–1.27)
GFR calc Af Amer: 107 mL/min/{1.73_m2} (ref >59)
GFR, EST NON AFRICAN AMERICAN: 92 mL/min/{1.73_m2} (ref >59)
GLUCOSE: 108 mg/dL — AB (ref 65–99)
Globulin, Total: 2.8 g/dL (ref 1.5–4.5)
POTASSIUM: 4.9 mmol/L (ref 3.5–5.2)
Sodium: 141 mmol/L (ref 134–144)
Total Protein: 6.9 g/dL (ref 6.0–8.5)

## 2015-12-15 LAB — TESTOSTERONE,FREE AND TOTAL
TESTOSTERONE FREE: 7.5 pg/mL (ref 6.6–18.1)
Testosterone: 290 ng/dL — ABNORMAL LOW (ref 348–1197)

## 2015-12-15 LAB — LIPID PANEL
CHOL/HDL RATIO: 5.1 ratio — AB (ref 0.0–5.0)
Cholesterol, Total: 142 mg/dL (ref 100–199)
HDL: 28 mg/dL — ABNORMAL LOW (ref >39)
LDL CALC: 76 mg/dL (ref 0–99)
TRIGLYCERIDES: 189 mg/dL — AB (ref 0–149)
VLDL Cholesterol Cal: 38 mg/dL (ref 5–40)

## 2015-12-15 LAB — T4 AND TSH
T4, Total: 6.8 ug/dL (ref 4.5–12.0)
TSH: 2.27 u[IU]/mL (ref 0.450–4.500)

## 2015-12-15 LAB — PSA: Prostate Specific Ag, Serum: 0.6 ng/mL (ref 0.0–4.0)

## 2015-12-15 NOTE — Telephone Encounter (Signed)
Patient advised as directed below. Patient verbalized understanding.  

## 2015-12-15 NOTE — Telephone Encounter (Signed)
LMTCB

## 2015-12-15 NOTE — Telephone Encounter (Signed)
-----   Message from Birdie Sons, MD sent at 12/15/2015  1:33 PM EST ----- Blood sugar, kidney functions, electrolytes are all normal.  Cholesterol is good. Slightly low total testosterone levels, but not low enough that it should affect energy levels. Continue current medications.  Check labs yearly.

## 2016-03-21 ENCOUNTER — Encounter: Payer: Self-pay | Admitting: Cardiovascular Disease

## 2016-03-21 ENCOUNTER — Ambulatory Visit (INDEPENDENT_AMBULATORY_CARE_PROVIDER_SITE_OTHER): Payer: BLUE CROSS/BLUE SHIELD | Admitting: Cardiovascular Disease

## 2016-03-21 VITALS — BP 110/72 | HR 59 | Ht 70.0 in | Wt 213.5 lb

## 2016-03-21 DIAGNOSIS — I2510 Atherosclerotic heart disease of native coronary artery without angina pectoris: Secondary | ICD-10-CM | POA: Diagnosis not present

## 2016-03-21 DIAGNOSIS — F17200 Nicotine dependence, unspecified, uncomplicated: Secondary | ICD-10-CM | POA: Diagnosis not present

## 2016-03-21 DIAGNOSIS — I10 Essential (primary) hypertension: Secondary | ICD-10-CM

## 2016-03-21 DIAGNOSIS — I700 Atherosclerosis of aorta: Secondary | ICD-10-CM | POA: Diagnosis not present

## 2016-03-21 DIAGNOSIS — E785 Hyperlipidemia, unspecified: Secondary | ICD-10-CM

## 2016-03-21 NOTE — Progress Notes (Signed)
Patient ID: Patrick Barrera, male   DOB: September 03, 1956, 60 y.o.   MRN: [id number] Cardiology Office Note  Date:  03/21/2016   ID:  Patrick Barrera, [place] 04-13-1956, MRN [id number]  PCP:  Lelon Huh, MD   Chief Complaint  Patient presents with  . OTHER    1 yr f/u. Meds reviewed verbally with pt.    HPI:  Mr. Patrick Barrera is a 60 year old gentleman with HTN, history of coronary artery disease, occlusion of his RCA in November 2007 with 2 bare-metal stents placed at that time also with 70% mid LAD lesion that was not intervened upon, long smoking history who stopped in October 2013, hyperlipidemia who presents for routine followup of his coronary artery disease Possible underlying COPD  In follow-up, he reports that he is doing well, denies any significant chest pain on exertion Tolerating Lipitor 80 mg daily Weight has been slowly trending upwards, poor diet with no exercise  He spends time with his grandkids  EKG on today's visit shows normal sinus rhythm with rate 59 bpm, no significant ST or T wave changes   PMH:   has a past medical history of Coronary artery disease; Myocardial infarction ([place]) (11/07); and Cholesterol blood lowered.  PSH:    Past Surgical History  Procedure Laterality Date  . Coronary stent placement  2007    x2  . Appendectomy    . Tonsillectomy and adenoidectomy    . Upper gi endoscopy  04/23/14    barretts esophagus, repeat 04/2017    Current Outpatient Prescriptions  Medication Sig Dispense Refill  . aspirin 81 MG EC tablet Take 81 mg by mouth daily.      Marland Kitchen atorvastatin (LIPITOR) 80 MG tablet TAKE 1 TABLET DAILY 90 tablet 3  . clopidogrel (PLAVIX) 75 MG tablet TAKE 1 TABLET DAILY 90 tablet 3  . doxylamine, Sleep, (UNISOM) 25 MG tablet Take 25 mg by mouth at bedtime.     . lansoprazole (PREVACID) 30 MG capsule Take by mouth.    Marland Kitchen lisinopril (PRINIVIL,ZESTRIL) 10 MG tablet Take 1 tablet (10 mg total) by mouth daily. 90 tablet 3  . metoprolol succinate  (TOPROL-XL) 50 MG 24 hr tablet TAKE 1 TABLET DAILY 90 tablet 3  . NITROSTAT 0.4 MG SL tablet DISSOLVE 1 TABLET UNDER TONGUE EVERY 5 MINUTES IF NEEDED CHEST PAIN.CALL 911 IF NO RELIEF AFTER 3 DOSES 25 tablet 1   No current facility-administered medications for this visit.     Allergies:   Atorvastatin; Codeine; and Ezetimibe-simvastatin   Social History:  The patient  reports that he has been smoking Cigarettes and E-cigarettes.  He has a 45 pack-year smoking history. He does not have any smokeless tobacco history on file. He reports that he drinks about 0.5 oz of alcohol per week. He reports that he does not use illicit drugs.   Family History:   family history includes Heart disease in his father; Pulmonary fibrosis in his father.    Review of Systems: Review of Systems  Constitutional: Negative.   Respiratory: Negative.   Cardiovascular: Negative.   Gastrointestinal: Negative.   Musculoskeletal: Negative.   Neurological: Negative.   Psychiatric/Behavioral: Negative.   All other systems reviewed and are negative.    PHYSICAL EXAM: VS:  BP 110/72 mmHg  Pulse 59  Ht 5\' 10"  (1.778 m)  Wt 213 lb 8 oz (96.843 kg)  BMI 30.63 kg/m2 , BMI Body mass index is 30.63 kg/(m^2). GEN: Well nourished, well developed, in no acute distress HEENT:  normal Neck: no JVD, carotid bruits, or masses Cardiac: RRR; no murmurs, rubs, or gallops,no edema  Respiratory:  clear to auscultation bilaterally, normal work of breathing GI: soft, nontender, nondistended, + BS MS: no deformity or atrophy Skin: warm and dry, no rash Neuro:  Strength and sensation are intact Psych: euthymic mood, full affect    Recent Labs: 12/13/2015: ALT 29; BUN 18; Creatinine, Ser 0.89; Platelets 239; Potassium 4.9; Sodium 141; TSH 2.270    Lipid Panel Lab Results  Component Value Date   CHOL 142 12/13/2015   HDL 28* 12/13/2015   LDLCALC 76 12/13/2015   TRIG 189* 12/13/2015      Wt Readings from Last 3  Encounters:  03/21/16 213 lb 8 oz (96.843 kg)  12/09/15 214 lb (97.07 kg)  03/12/14 200 lb (90.719 kg)       ASSESSMENT AND PLAN:  Atherosclerosis of native coronary artery of native heart without angina pectoris - Plan: EKG 12-Lead Currently with no symptoms of angina. No further workup at this time. Continue current medication regimen.  Essential hypertension - Plan: EKG 12-Lead Blood pressure is well controlled on today's visit. No changes made to the medications.  SMOKER Stop smoking several years ago  Hyperlipidemia Cholesterol above goal, recommended weight loss, repeat lipid panel when seen by primary care. If cholesterol continues to run high, would add zetia 10 mgm daily   Total encounter time more than 15 minutes  Greater than 50% was spent in counseling and coordination of care with the patient   Disposition:   F/U  6 months   Orders Placed This Encounter  Procedures  . EKG 12-Lead     Signed, Esmond Plants, M.D., Ph.D. 03/21/2016  [place], [place]

## 2016-03-21 NOTE — Patient Instructions (Signed)
You are doing well. No medication changes were made.  Please call us if you have new issues that need to be addressed before your next appt.  Your physician wants you to follow-up in: 12 months.  You will receive a reminder letter in the mail two months in advance. If you don't receive a letter, please call our office to schedule the follow-up appointment. 

## 2016-03-22 ENCOUNTER — Telehealth: Payer: Self-pay | Admitting: Cardiovascular Disease

## 2016-03-22 MED ORDER — LISINOPRIL 10 MG PO TABS: 10.0000 mg | ORAL_TABLET | Freq: Every day | ORAL | Status: DC

## 2016-03-22 MED ORDER — ATORVASTATIN CALCIUM 80 MG PO TABS: 80.0000 mg | ORAL_TABLET | Freq: Every day | ORAL | Status: DC

## 2016-03-22 MED ORDER — METOPROLOL SUCCINATE ER 50 MG PO TB24: 50.0000 mg | ORAL_TABLET | Freq: Every day | ORAL | Status: DC

## 2016-03-22 MED ORDER — CLOPIDOGREL BISULFATE 75 MG PO TABS: 75.0000 mg | ORAL_TABLET | Freq: Every day | ORAL | Status: DC

## 2016-03-22 NOTE — Telephone Encounter (Signed)
°*  STAT* If patient is at the pharmacy, call can be transferred to refill team.   1. Which medications need to be refilled? (please list name of each medication and dose if known) Metoprolol,Lisinprolol, Atorvastatin, Plavix   2. Which pharmacy/location (including street and city if local pharmacy) is medication to be sent to[facility]    3. Do they need a 30 day or 90 day supply? 90 day

## 2016-03-22 NOTE — Telephone Encounter (Signed)
90 day supply sent for Metoprolol, Lisinopril, Atorvastatin and Plavix.

## 2016-06-04 ENCOUNTER — Other Ambulatory Visit: Payer: Self-pay | Admitting: Cardiovascular Disease

## 2016-09-02 ENCOUNTER — Other Ambulatory Visit: Payer: Self-pay | Admitting: Cardiovascular Disease

## 2016-10-23 ENCOUNTER — Telehealth: Payer: Self-pay | Admitting: Cardiovascular Disease

## 2016-10-23 MED ORDER — ATORVASTATIN CALCIUM 80 MG PO TABS: 80.0000 mg | ORAL_TABLET | Freq: Every day | ORAL | 0 refills | Status: DC

## 2016-10-23 MED ORDER — ATORVASTATIN CALCIUM 80 MG PO TABS: 80.0000 mg | ORAL_TABLET | Freq: Every day | ORAL | 3 refills | Status: DC

## 2016-10-23 NOTE — Telephone Encounter (Signed)
Refill sent for atorvastatin for 90 day and 30 day supply.

## 2016-10-23 NOTE — Telephone Encounter (Signed)
°*  STAT* If patient is at the pharmacy, call can be transferred to refill team.   1. Which medications need to be refilled? (please list name of each medication and dose if known) atorvastatin   2. Which pharmacy/location (including street and city if local pharmacy) is medication to be sent to? [facility] [place] [facility]   3. Do they need a 30 day or 90 day supply? [place]

## 2016-11-19 ENCOUNTER — Other Ambulatory Visit: Payer: Self-pay | Admitting: Cardiovascular Disease

## 2016-12-01 ENCOUNTER — Other Ambulatory Visit: Payer: Self-pay | Admitting: Cardiovascular Disease

## 2017-01-14 ENCOUNTER — Encounter: Payer: Self-pay | Admitting: Family Medicine

## 2017-01-14 ENCOUNTER — Ambulatory Visit (INDEPENDENT_AMBULATORY_CARE_PROVIDER_SITE_OTHER): Payer: BLUE CROSS/BLUE SHIELD | Admitting: Family Medicine

## 2017-01-14 VITALS — BP 128/82 | HR 72 | Temp 98.1°F | Resp 16 | Ht 70.0 in | Wt 215.0 lb

## 2017-01-14 DIAGNOSIS — I10 Essential (primary) hypertension: Secondary | ICD-10-CM | POA: Diagnosis not present

## 2017-01-14 DIAGNOSIS — E785 Hyperlipidemia, unspecified: Secondary | ICD-10-CM

## 2017-01-14 DIAGNOSIS — I2510 Atherosclerotic heart disease of native coronary artery without angina pectoris: Secondary | ICD-10-CM | POA: Diagnosis not present

## 2017-01-14 DIAGNOSIS — E291 Testicular hypofunction: Secondary | ICD-10-CM

## 2017-01-14 DIAGNOSIS — F17210 Nicotine dependence, cigarettes, uncomplicated: Secondary | ICD-10-CM | POA: Diagnosis not present

## 2017-01-14 DIAGNOSIS — Z0000 Encounter for general adult medical examination without abnormal findings: Secondary | ICD-10-CM | POA: Diagnosis not present

## 2017-01-14 DIAGNOSIS — Z125 Encounter for screening for malignant neoplasm of prostate: Secondary | ICD-10-CM

## 2017-01-14 DIAGNOSIS — R7989 Other specified abnormal findings of blood chemistry: Secondary | ICD-10-CM

## 2017-01-14 DIAGNOSIS — K22719 Barrett's esophagus with dysplasia, unspecified: Secondary | ICD-10-CM | POA: Diagnosis not present

## 2017-01-14 DIAGNOSIS — I739 Peripheral vascular disease, unspecified: Secondary | ICD-10-CM | POA: Diagnosis not present

## 2017-01-14 DIAGNOSIS — Z23 Encounter for immunization: Secondary | ICD-10-CM | POA: Diagnosis not present

## 2017-01-14 NOTE — Progress Notes (Addendum)
Patient: Patrick Barrera, Male    DOB: Aug 11, 1956, 61 y.o.   MRN: [id number] Visit Date: 01/14/2017  Today's Provider: Lelon Huh, MD   Chief Complaint  Patient presents with  . Annual Exam   Subjective:    Annual physical exam Patrick Barrera is a 61 y.o. male who presents today for health maintenance and complete physical. He feels well. He reports no regular exercise, but he does stay active. He reports he is sleeping well.    Review of Systems  Constitutional: Negative.   HENT: Negative.   Eyes: Negative.   Respiratory: Negative.   Cardiovascular: Negative.   Gastrointestinal: Negative.   Endocrine: Negative.   Genitourinary: Negative.   Musculoskeletal: Negative.   Skin: Negative.   Allergic/Immunologic: Positive for environmental allergies.  Neurological: Negative.   Hematological: Negative.   Psychiatric/Behavioral: Negative.     Social History      He  reports that he has been smoking Cigarettes and E-cigarettes.  He has a 45.00 pack-year smoking history. He has never used smokeless tobacco. He reports that he drinks about 0.5 oz of alcohol per week . He reports that he does not use drugs.       Social History   Social History  . Marital status: Married    Spouse name: N/A  . Number of children: N/A  . Years of education: N/A   Occupational History  . 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 Smoking status: Current Every Day Smoker    Packs/day: 1.00    Years: 45.00    Types: Cigarettes, E-cigarettes  . Smokeless tobacco: Never Used  . Alcohol use 0.5 oz/week    1 Standard drinks or equivalent per week     Comment: Occasional  . Drug use: No  . Sexual activity: Not Asked   Other Topics Concern  . None   Social History Narrative   No regular exercise.    Past Medical History:  Diagnosis Date  . Cholesterol blood lowered   . Coronary artery disease   . Myocardial infarction 11/07   With  occlusion of RCA, stents x2     Patient Active Problem List   Diagnosis Date Noted  . Barrett esophagus 12/06/2015  . Diverticulosis of colon 12/06/2015  . H/O acute myocardial infarction 12/06/2015  . Corrosion of second degree of head and neck 02/22/2014  . HTN (hypertension) 09/10/2011  . Hyperlipidemia 09/27/2010  . SMOKER 09/27/2010  . CAD, NATIVE VESSEL 09/27/2010  . ATHEROSCLEROSIS OF AORTA 09/27/2010  . UNSPECIFIED PERIPHERAL VASCULAR DISEASE 09/27/2010  . ABDOMINAL BRUIT 09/27/2010  . Allergic rhinitis 11/30/2009  . ED (erectile dysfunction) of organic origin 11/19/2008  . 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 Acid reflux 10/08/2004  . Calcification of intervertebral cartilage or disc of cervical region 11/09/2003    Past Surgical History:  Procedure Laterality Date  . APPENDECTOMY    . CORONARY STENT PLACEMENT  2007   x2  . TONSILLECTOMY AND ADENOIDECTOMY    . UPPER GI ENDOSCOPY  04/23/14   barretts esophagus, repeat 04/2017    Family History        Family Status  Relation Status  . Father Deceased at age 79  . Mother Alive   Good health  . Sister Alive  . Brother Deceased   due to MVA        His family history includes Heart disease in his  father; Pulmonary fibrosis in his father.     Allergies  Allergen Reactions  . Atorvastatin   . Codeine Nausea Only  . Ezetimibe-Simvastatin Other (See Comments)    Leg cramps     Current Outpatient Prescriptions:  .  aspirin 81 MG EC tablet, Take 81 mg by mouth daily.  , Disp: , Rfl:  .  atorvastatin (LIPITOR) 80 MG tablet, TAKE 1 TABLET (80 MG TOTAL) BY MOUTH DAILY., Disp: 30 tablet, Rfl: 0 .  clopidogrel (PLAVIX) 75 MG tablet, TAKE 1 TABLET DAILY, Disp: 90 tablet, Rfl: 3 .  doxylamine, Sleep, (UNISOM) 25 MG tablet, Take 25 mg by mouth at bedtime. , Disp: , Rfl:  .  lansoprazole (PREVACID) 30 MG capsule, Take by mouth., Disp: , Rfl:  .  lisinopril (PRINIVIL,ZESTRIL) 10 MG tablet, TAKE 1 TABLET DAILY (PATIENT  NEEDS TO CONTACT OFFICE TO SCHEDULE APPOINTMENT AND REFILLS), Disp: 90 tablet, Rfl: 0 .  metoprolol succinate (TOPROL-XL) 50 MG 24 hr tablet, TAKE 1 TABLET DAILY, Disp: 90 tablet, Rfl: 3 .  NITROSTAT 0.4 MG SL tablet, DISSOLVE 1 TABLET UNDER TONGUE EVERY 5 MINUTES IF NEEDED CHEST PAIN.CALL 911 IF NO RELIEF AFTER 3 DOSES, Disp: 25 tablet, Rfl: 1   Patient Care Team: Birdie Sons, MD as PCP - General (Family Medicine) Minna Merritts, MD as Consulting Physician (Cardiology)      Objective:   Vitals: BP 128/82 (BP Location: Left Arm, Patient Position: Sitting, Cuff Size: Normal)   Pulse 72   Temp 98.1 F (36.7 C)   Resp 16   Ht 5\' 10"  (1.778 m)   Wt 215 lb (97.5 kg)   BMI 30.85 kg/m       Physical Exam   General Appearance:    Alert, cooperative, no distress, appears stated age, overweight  Head:    Normocephalic, without obvious abnormality, atraumatic  Eyes:    PERRL, conjunctiva/corneas clear, EOM's intact, fundi    benign, both eyes       Ears:    Normal TM's and external ear canals, both ears  Nose:   Nares normal, septum midline, mucosa normal, no drainage   or sinus tenderness  Throat:   Lips, mucosa, and tongue normal; teeth and gums normal  Neck:   Supple, symmetrical, trachea midline, no adenopathy;       thyroid:  No enlargement/tenderness/nodules; no carotid   bruit or JVD  Back:     Symmetric, no curvature, ROM normal, no CVA tenderness  Lungs:     Clear to auscultation bilaterally, respirations unlabored  Chest wall:    No tenderness or deformity  Heart:    Regular rate and rhythm, S1 and S2 normal, no murmur, rub   or gallop  Abdomen:     Soft, non-tender, bowel sounds active all four quadrants,    no masses, no organomegaly  Genitalia:    deferred  Rectal:    deferred  Extremities:   Extremities normal, atraumatic, no cyanosis or edema  Pulses:   2+ and symmetric all extremities  Skin:   Skin color, texture, turgor normal, no rashes or lesions  Lymph  nodes:   Cervical, supraclavicular, and axillary nodes normal  Neurologic:   CNII-XII intact. Normal strength, sensation and reflexes      throughout    Depression Screen PHQ 2/9 Scores 01/14/2017 01/14/2017 12/09/2015  PHQ - 2 Score 0 0 0  PHQ- 9 Score 0 - 1      Assessment & Plan:  Routine Health Maintenance and Physical Exam  Exercise Activities and Dietary recommendations Goals    None      Immunization History  Administered Date(s) Administered  . Tdap 12/05/2007  . Zoster 12/09/2015    Health Maintenance  Topic Date Due  . HIV Screening  05/22/1969  . INFLUENZA VACCINE  05/08/2017  . TETANUS/TDAP  12/04/2017  . COLONOSCOPY  04/28/2024  . Hepatitis C Screening  Completed     Discussed health benefits of physical activity, and encouraged him to engage in regular exercise appropriate for his age and condition.   1. Annual physical exam Generally doing well. Recommend he work on cessation of vaping.   2. Atherosclerosis of native coronary artery of native heart without angina pectoris Asymptomatic. Compliant with medication.  Continue aggressive risk factor modification.  Continue routine follow up Dr. Rockey Situ - CBC  3. Essential hypertension Well controlled.  Continue current medications.    4. Peripheral artery disease ([place])   5. Barrett's esophagus with dysplasia Continue routine follow up Dr. Tiffany Kocher.   6. Hyperlipidemia, unspecified hyperlipidemia type .He is tolerating atorvastatin well with no adverse effects.   - Comprehensive metabolic panel - Lipid panel - CBC  7. Low testosterone in male Borderline, not currently on TRT.  Lab Results  Component Value Date   TESTOSTERONE 290 (L) 12/13/2015    - Testosterone,Free and Total  8. Prostate cancer screening  - PSA  9. Need for pneumococcal vaccination  - Pneumococcal polysaccharide vaccine 23-valent greater than or equal to 2yo subcutaneous/IM  10. Smoking history > 30 pack years.    Recommended LDCT lung cancer screening. Given written materials. Advised to call if he decides to pursue.    Lelon Huh, MD  [facility]

## 2017-01-14 NOTE — Patient Instructions (Addendum)
   Screening for lung cancer is recommended for people between 55 and 63 years of age who have smoked the equivalent of 1 pack per day for 30 years. Please call our office at [phone] to schedule a low dose CT lung scan for lung cancer screening.   

## 2017-01-16 DIAGNOSIS — Z125 Encounter for screening for malignant neoplasm of prostate: Secondary | ICD-10-CM | POA: Diagnosis not present

## 2017-01-16 DIAGNOSIS — E291 Testicular hypofunction: Secondary | ICD-10-CM | POA: Diagnosis not present

## 2017-01-16 DIAGNOSIS — E785 Hyperlipidemia, unspecified: Secondary | ICD-10-CM | POA: Diagnosis not present

## 2017-01-16 DIAGNOSIS — I2510 Atherosclerotic heart disease of native coronary artery without angina pectoris: Secondary | ICD-10-CM | POA: Diagnosis not present

## 2017-01-17 LAB — LIPID PANEL
CHOLESTEROL TOTAL: 136 mg/dL (ref 100–199)
Chol/HDL Ratio: 5 ratio (ref 0.0–5.0)
HDL: 27 mg/dL — AB (ref >39)
LDL Calculated: 73 mg/dL (ref 0–99)
TRIGLYCERIDES: 181 mg/dL — AB (ref 0–149)
VLDL CHOLESTEROL CAL: 36 mg/dL (ref 5–40)

## 2017-01-17 LAB — COMPREHENSIVE METABOLIC PANEL
A/G RATIO: 1.2 (ref 1.2–2.2)
ALK PHOS: 91 IU/L (ref 39–117)
ALT: 27 IU/L (ref 0–44)
AST: 18 IU/L (ref 0–40)
Albumin: 3.9 g/dL (ref 3.6–4.8)
BILIRUBIN TOTAL: 0.6 mg/dL (ref 0.0–1.2)
BUN/Creatinine Ratio: 20 (ref 10–24)
BUN: 17 mg/dL (ref 8–27)
CHLORIDE: 102 mmol/L (ref 96–106)
CO2: 25 mmol/L (ref 18–29)
Calcium: 9.2 mg/dL (ref 8.6–10.2)
Creatinine, Ser: 0.85 mg/dL (ref 0.76–1.27)
GFR calc non Af Amer: 93 mL/min/{1.73_m2} (ref >59)
GFR, EST AFRICAN AMERICAN: 108 mL/min/{1.73_m2} (ref >59)
GLUCOSE: 100 mg/dL — AB (ref 65–99)
Globulin, Total: 3.2 g/dL (ref 1.5–4.5)
POTASSIUM: 4.9 mmol/L (ref 3.5–5.2)
Sodium: 141 mmol/L (ref 134–144)
Total Protein: 7.1 g/dL (ref 6.0–8.5)

## 2017-01-17 LAB — TESTOSTERONE,FREE AND TOTAL
Testosterone, Free: 10.3 pg/mL (ref 6.6–18.1)
Testosterone: 295 ng/dL (ref 264–916)

## 2017-01-17 LAB — CBC
HEMATOCRIT: 41.4 % (ref 37.5–51.0)
Hemoglobin: 13.6 g/dL (ref 13.0–17.7)
MCH: 32.2 pg (ref 26.6–33.0)
MCHC: 32.9 g/dL (ref 31.5–35.7)
MCV: 98 fL — AB (ref 79–97)
Platelets: 229 10*3/uL (ref 150–379)
RBC: 4.22 x10E6/uL (ref 4.14–5.80)
RDW: 13.6 % (ref 12.3–15.4)
WBC: 6.6 10*3/uL (ref 3.4–10.8)

## 2017-01-17 LAB — PSA: Prostate Specific Ag, Serum: 0.6 ng/mL (ref 0.0–4.0)

## 2017-01-21 ENCOUNTER — Telehealth: Payer: Self-pay | Admitting: Family Medicine

## 2017-01-21 NOTE — Telephone Encounter (Signed)
Pt is returning call.  CB#[phone]/MW

## 2017-01-21 NOTE — Progress Notes (Signed)
Advised  ED 

## 2017-03-01 ENCOUNTER — Other Ambulatory Visit: Payer: Self-pay | Admitting: Cardiovascular Disease

## 2017-03-20 NOTE — Progress Notes (Signed)
Patient ID: Patrick Barrera, male   DOB: 1956/04/19, 61 y.o.   MRN: [id number] Cardiology Office Note  Date:  03/21/2017   ID:  Patrick Barrera, [place] Aug 23, 1956, MRN [id number]  PCP:  Birdie Sons, MD   Chief Complaint  Patient presents with  . other    12 month follow up. Patient denies chest pain and SOB. Meds reviewed verbally with patient.     HPI:  Patrick Barrera is a 61 year old gentleman with  HTN,  coronary artery disease,  occlusion of his RCA in November 2007 with 2 bare-metal stents placed at that time also with 70% mid LAD lesion that was not intervened upon,  long smoking history who stopped in October 2013,  Possible underlying COPD hyperlipidemia  who presents for routine followup of his coronary artery disease  Some burning in the chest with heavy exertion, Chronic issue, thinks it is his breathing and COPD Currently not on inhalers but interested in trying albuterol for the symptoms Otherwise active at baseline without significant chest issues  Tolerating Lipitor 80 mg daily Cholesterol at goal,  LDL less than 70 Weight stable but above his goal weight  He spends time with his grandkids Going to the beach next week  EKG on today's visit shows normal sinus rhythm with rate 78 bpm, old inferior MI , no significant ST or T wave changes   PMH:   has a past medical history of Cholesterol blood lowered; Coronary artery disease; and Myocardial infarction ([place]) (11/07).  PSH:    Past Surgical History:  Procedure Laterality Date  . APPENDECTOMY    . CORONARY STENT PLACEMENT  2007   x2  . TONSILLECTOMY AND ADENOIDECTOMY    . UPPER GI ENDOSCOPY  04/23/14   barretts esophagus, repeat 04/2017    Current Outpatient Prescriptions  Medication Sig Dispense Refill  . aspirin 81 MG EC tablet Take 81 mg by mouth daily.      Marland Kitchen atorvastatin (LIPITOR) 80 MG tablet TAKE 1 TABLET (80 MG TOTAL) BY MOUTH DAILY. 30 tablet 0  . clopidogrel (PLAVIX) 75 MG tablet TAKE 1 TABLET  DAILY 90 tablet 3  . doxylamine, Sleep, (UNISOM) 25 MG tablet Take 25 mg by mouth at bedtime.     . lansoprazole (PREVACID) 30 MG capsule Take by mouth.    Marland Kitchen lisinopril (PRINIVIL,ZESTRIL) 10 MG tablet TAKE 1 TABLET DAILY (PATIENT NEEDS TO CONTACT OFFICE TO SCHEDULE APPOINTMENT AND REFILLS) 90 tablet 0  . metoprolol succinate (TOPROL-XL) 50 MG 24 hr tablet TAKE 1 TABLET DAILY 90 tablet 3  . NITROSTAT 0.4 MG SL tablet DISSOLVE 1 TABLET UNDER TONGUE EVERY 5 MINUTES IF NEEDED CHEST PAIN.CALL 911 IF NO RELIEF AFTER 3 DOSES 25 tablet 1  . albuterol (PROVENTIL HFA;VENTOLIN HFA) 108 (90 Base) MCG/ACT inhaler Inhale 2 puffs into the lungs every 6 (six) hours as needed for wheezing or shortness of breath. 1 Inhaler 2   No current facility-administered medications for this visit.      Allergies:   Atorvastatin; Codeine; and Ezetimibe-simvastatin   Social History:  The patient  reports that he has been smoking Cigarettes and E-cigarettes.  He has a 45.00 pack-year smoking history. He has never used smokeless tobacco. He reports that he drinks about 0.5 oz of alcohol per week . He reports that he does not use drugs.   Family History:   family history includes Heart disease in his father; Pulmonary fibrosis in his father.    Review of Systems: Review of  Systems  Constitutional: Negative.   Respiratory: Negative.   Cardiovascular: Negative.   Gastrointestinal: Negative.   Musculoskeletal: Negative.   Neurological: Negative.   Psychiatric/Behavioral: Negative.   All other systems reviewed and are negative.    PHYSICAL EXAM: VS:  BP 130/60 (BP Location: Left Arm, Patient Position: Sitting, Cuff Size: Normal)   Pulse 78   Ht 5\' 10"  (1.778 m)   Wt 211 lb 8 oz (95.9 kg)   BMI 30.35 kg/m  , BMI Body mass index is 30.35 kg/m. GEN: Well nourished, well developed, in no acute distress  HEENT: normal  Neck: no JVD, carotid bruits, or masses Cardiac: RRR; no murmurs, rubs, or gallops,no edema   Respiratory:  clear to auscultation bilaterally, normal work of breathing GI: soft, nontender, nondistended, + BS MS: no deformity or atrophy  Skin: warm and dry, no rash Neuro:  Strength and sensation are intact Psych: euthymic mood, full affect    Recent Labs: 01/16/2017: ALT 27; BUN 17; Creatinine, Ser 0.85; Hemoglobin 13.6; Platelets 229; Potassium 4.9; Sodium 141    Lipid Panel Lab Results  Component Value Date   CHOL 136 01/16/2017   HDL 27 (L) 01/16/2017   LDLCALC 73 01/16/2017   TRIG 181 (H) 01/16/2017      Wt Readings from Last 3 Encounters:  03/21/17 211 lb 8 oz (95.9 kg)  01/14/17 215 lb (97.5 kg)  03/21/16 213 lb 8 oz (96.8 kg)       ASSESSMENT AND PLAN:  Atherosclerosis of native coronary artery of native heart without angina pectoris - Plan: EKG 12-Lead Currently with no symptoms of angina. No further workup at this time. Continue current medication regimen.  Essential hypertension - Plan: EKG 12-Lead Blood pressure is well controlled on today's visit. No changes made to the medications.  SMOKER Stop smoking several years ago, 2013  Hyperlipidemia Cholesterol is at goal on the current lipid regimen. No changes to the medications were made.   Discussed health issues about his son, risk factors, possible workup Recommended he consider CT coronary calcium score for his son  Total encounter time more than 25 minutes  Greater than 50% was spent in counseling and coordination of care with the patient   Disposition:   F/U  12 months   Orders Placed This Encounter  Procedures  . EKG 12-Lead     Signed, Esmond Plants, M.D., Ph.D. 03/21/2017  [place], [place]

## 2017-03-21 ENCOUNTER — Encounter: Payer: Self-pay | Admitting: Cardiovascular Disease

## 2017-03-21 ENCOUNTER — Ambulatory Visit (INDEPENDENT_AMBULATORY_CARE_PROVIDER_SITE_OTHER): Payer: BLUE CROSS/BLUE SHIELD | Admitting: Cardiovascular Disease

## 2017-03-21 VITALS — BP 130/60 | HR 78 | Ht 70.0 in | Wt 211.5 lb

## 2017-03-21 DIAGNOSIS — F17210 Nicotine dependence, cigarettes, uncomplicated: Secondary | ICD-10-CM | POA: Diagnosis not present

## 2017-03-21 DIAGNOSIS — I739 Peripheral vascular disease, unspecified: Secondary | ICD-10-CM | POA: Diagnosis not present

## 2017-03-21 DIAGNOSIS — E782 Mixed hyperlipidemia: Secondary | ICD-10-CM

## 2017-03-21 DIAGNOSIS — I25118 Atherosclerotic heart disease of native coronary artery with other forms of angina pectoris: Secondary | ICD-10-CM

## 2017-03-21 DIAGNOSIS — I10 Essential (primary) hypertension: Secondary | ICD-10-CM | POA: Diagnosis not present

## 2017-03-21 MED ORDER — ALBUTEROL SULFATE HFA 108 (90 BASE) MCG/ACT IN AERS: 2.0000 | INHALATION_SPRAY | Freq: Four times a day (QID) | RESPIRATORY_TRACT | 2 refills | Status: DC | PRN

## 2017-03-21 NOTE — Patient Instructions (Signed)

## 2017-03-21 NOTE — Addendum Note (Signed)
Addended by: Minna Merritts on: 03/21/2017 02:38 PM   Modules accepted: Level of Service

## 2017-03-27 ENCOUNTER — Other Ambulatory Visit: Payer: Self-pay

## 2017-03-27 MED ORDER — NITROGLYCERIN 0.4 MG SL SUBL: SUBLINGUAL_TABLET | SUBLINGUAL | 1 refills | Status: DC

## 2017-05-13 ENCOUNTER — Ambulatory Visit (INDEPENDENT_AMBULATORY_CARE_PROVIDER_SITE_OTHER): Payer: BLUE CROSS/BLUE SHIELD | Admitting: Family Medicine

## 2017-05-13 VITALS — BP 128/72 | HR 62 | Temp 98.3°F | Resp 16 | Ht 70.0 in | Wt 215.0 lb

## 2017-05-13 DIAGNOSIS — R197 Diarrhea, unspecified: Secondary | ICD-10-CM | POA: Diagnosis not present

## 2017-05-13 NOTE — Progress Notes (Signed)
Patient: Patrick Barrera Male    DOB: 18-Feb-1956   61 y.o.   MRN: [id number] Visit Date: 05/13/2017  Today's Provider: Lelon Huh, MD   Chief Complaint  Patient presents with  . Diarrhea   Subjective:    HPI Patient comes in today c/o diarrhea X 1 week. Patient denies any fever or vomiting. He does have occasional cramping and a bloating sensation. He has noticed a very minimal amount of blood in his stool, but he feels this is due to straining. He has been taking Imodium, but it has not helped much. He also mentions that he has had similar symptoms in 2011 fter taking Augmentin for a sinus infection. He states that the diarrhea lasted for over 3 months, and it eventually went away. He was prescribed metronidazole, which didn't seem to help. He had negative C diff and O&P. He was evaluated by Dr. Herbert Deaner and told he had some inflammation in his colon. No recent travel. No change in diet. No fever, chills, sweats, or blood in stool. Mild nausea present but no vomiting.     Allergies  Allergen Reactions  . Atorvastatin   . Codeine Nausea Only  . Ezetimibe-Simvastatin Other (See Comments)    Leg cramps     Current Outpatient Prescriptions:  .  albuterol (PROVENTIL HFA;VENTOLIN HFA) 108 (90 Base) MCG/ACT inhaler, Inhale 2 puffs into the lungs every 6 (six) hours as needed for wheezing or shortness of breath., Disp: 1 Inhaler, Rfl: 2 .  aspirin 81 MG EC tablet, Take 81 mg by mouth daily.  , Disp: , Rfl:  .  atorvastatin (LIPITOR) 80 MG tablet, TAKE 1 TABLET (80 MG TOTAL) BY MOUTH DAILY., Disp: 30 tablet, Rfl: 0 .  clopidogrel (PLAVIX) 75 MG tablet, TAKE 1 TABLET DAILY, Disp: 90 tablet, Rfl: 3 .  doxylamine, Sleep, (UNISOM) 25 MG tablet, Take 25 mg by mouth at bedtime. , Disp: , Rfl:  .  lansoprazole (PREVACID) 30 MG capsule, Take by mouth., Disp: , Rfl:  .  lisinopril (PRINIVIL,ZESTRIL) 10 MG tablet, TAKE 1 TABLET DAILY (PATIENT NEEDS TO CONTACT OFFICE TO SCHEDULE APPOINTMENT  AND REFILLS), Disp: 90 tablet, Rfl: 0 .  metoprolol succinate (TOPROL-XL) 50 MG 24 hr tablet, TAKE 1 TABLET DAILY, Disp: 90 tablet, Rfl: 3 .  nitroGLYCERIN (NITROSTAT) 0.4 MG SL tablet, DISSOLVE 1 TABLET UNDER TONGUE EVERY 5 MINUTES IF NEEDED CHEST PAIN.CALL 911 IF NO RELIEF AFTER 3 DOSES, Disp: 25 tablet, Rfl: 1  Review of Systems  Constitutional: Positive for appetite change and fatigue.  Gastrointestinal: Positive for abdominal pain, blood in stool, diarrhea and nausea.  Genitourinary: Negative.   Neurological: Negative for dizziness, weakness and headaches.    Social History  Substance Use Topics  . Smoking status: Current Every Day Smoker    Packs/day: 1.00    Years: 45.00    Types: Cigarettes, E-cigarettes  . Smokeless tobacco: Never Used     Comment: changed from cigarettes to Vaping in 2013. Previousl 1.5 ppd since 12  . Alcohol use 0.5 oz/week    1 Standard drinks or equivalent per week     Comment: Occasional   Objective:   BP 128/72 (BP Location: Right Arm, Patient Position: Sitting, Cuff Size: Normal)   Pulse 62   Temp 98.3 F (36.8 C)   Resp 16   Ht 5\' 10"  (1.778 m)   Wt 215 lb (97.5 kg)   SpO2 99%   BMI 30.85 kg/m  Vitals:  05/13/17 1047  BP: 128/72  Pulse: 62  Resp: 16  Temp: 98.3 F (36.8 C)  SpO2: 99%  Weight: 215 lb (97.5 kg)  Height: 5\' 10"  (1.778 m)     Physical Exam  General Appearance:    Alert, cooperative, no distress  Eyes:    PERRL, conjunctiva/corneas clear, EOM's intact       Lungs:     Clear to auscultation bilaterally, respirations unlabored  Heart:    Regular rate and rhythm  Abdomen:   bowel sounds present and normal in all 4 quadrants, soft, round, nontender or nondistended. No CVA tenderness        Assessment & Plan:     1. Diarrhea, unspecified type Similar to episode in 2011/12 which lasted for months. Go ahead and start Align. Check stool studies. Consider referral to GI for re-evaluation.  - Stool, WBC/Lactoferrin -  Stool culture - C difficile Toxins A+B W/Rflx - Ova and parasite examination       Lelon Huh, MD  [facility]

## 2017-05-13 NOTE — Patient Instructions (Signed)
Start taking OTC probiotic such as Align every day.  

## 2017-05-24 LAB — STOOL CULTURE: E coli, Shiga toxin Assay: NEGATIVE

## 2017-05-24 LAB — C DIFFICILE, CYTOTOXIN B

## 2017-05-24 LAB — C DIFFICILE TOXINS A+B W/RFLX: C DIFFICILE TOXINS A+B, EIA: NEGATIVE

## 2017-05-24 LAB — OVA AND PARASITE EXAMINATION

## 2017-05-24 LAB — FECAL LACTOFERRIN, QUANT: Lactoferrin, Fecal, Quant.: 15.3 ug/mL(g) — ABNORMAL HIGH (ref 0.00–7.24)

## 2017-05-28 ENCOUNTER — Other Ambulatory Visit: Payer: Self-pay | Admitting: Family Medicine

## 2017-05-28 DIAGNOSIS — K529 Noninfective gastroenteritis and colitis, unspecified: Secondary | ICD-10-CM

## 2017-05-30 ENCOUNTER — Other Ambulatory Visit: Payer: Self-pay | Admitting: Cardiovascular Disease

## 2017-07-25 DIAGNOSIS — Z8719 Personal history of other diseases of the digestive system: Secondary | ICD-10-CM | POA: Diagnosis not present

## 2017-07-25 DIAGNOSIS — K2270 Barrett's esophagus without dysplasia: Secondary | ICD-10-CM | POA: Diagnosis not present

## 2017-10-20 ENCOUNTER — Other Ambulatory Visit: Payer: Self-pay | Admitting: Cardiovascular Disease

## 2017-11-07 ENCOUNTER — Encounter: Payer: Self-pay | Admitting: *Deleted

## 2017-11-08 ENCOUNTER — Encounter
Admission: RE | Disposition: A | Discharge status: Home / Self Care | Payer: Self-pay | Source: Ambulatory Visit | Attending: Unknown Physician Specialty

## 2017-11-08 ENCOUNTER — Ambulatory Visit: Payer: BLUE CROSS/BLUE SHIELD | Admitting: Anesthesiology

## 2017-11-08 ENCOUNTER — Ambulatory Visit
Admission: RE | Admit: 2017-11-08 | Discharge: 2017-11-08 | Disposition: A | Discharge status: Home / Self Care | Payer: BLUE CROSS/BLUE SHIELD | Source: Ambulatory Visit | Attending: Unknown Physician Specialty | Admitting: Unknown Physician Specialty

## 2017-11-08 DIAGNOSIS — D123 Benign neoplasm of transverse colon: Secondary | ICD-10-CM | POA: Insufficient documentation

## 2017-11-08 DIAGNOSIS — I252 Old myocardial infarction: Secondary | ICD-10-CM | POA: Diagnosis not present

## 2017-11-08 DIAGNOSIS — K640 First degree hemorrhoids: Secondary | ICD-10-CM | POA: Diagnosis not present

## 2017-11-08 DIAGNOSIS — Z79899 Other long term (current) drug therapy: Secondary | ICD-10-CM | POA: Diagnosis not present

## 2017-11-08 DIAGNOSIS — K219 Gastro-esophageal reflux disease without esophagitis: Secondary | ICD-10-CM | POA: Insufficient documentation

## 2017-11-08 DIAGNOSIS — F17210 Nicotine dependence, cigarettes, uncomplicated: Secondary | ICD-10-CM | POA: Diagnosis not present

## 2017-11-08 DIAGNOSIS — Z955 Presence of coronary angioplasty implant and graft: Secondary | ICD-10-CM | POA: Diagnosis not present

## 2017-11-08 DIAGNOSIS — K648 Other hemorrhoids: Secondary | ICD-10-CM | POA: Diagnosis not present

## 2017-11-08 DIAGNOSIS — Z86010 Personal history of colonic polyps: Secondary | ICD-10-CM | POA: Diagnosis not present

## 2017-11-08 DIAGNOSIS — K635 Polyp of colon: Secondary | ICD-10-CM | POA: Diagnosis not present

## 2017-11-08 DIAGNOSIS — I2510 Atherosclerotic heart disease of native coronary artery without angina pectoris: Secondary | ICD-10-CM | POA: Diagnosis not present

## 2017-11-08 DIAGNOSIS — K222 Esophageal obstruction: Secondary | ICD-10-CM | POA: Diagnosis not present

## 2017-11-08 DIAGNOSIS — K2270 Barrett's esophagus without dysplasia: Secondary | ICD-10-CM | POA: Diagnosis not present

## 2017-11-08 DIAGNOSIS — K625 Hemorrhage of anus and rectum: Secondary | ICD-10-CM | POA: Diagnosis not present

## 2017-11-08 DIAGNOSIS — K5730 Diverticulosis of large intestine without perforation or abscess without bleeding: Secondary | ICD-10-CM | POA: Insufficient documentation

## 2017-11-08 HISTORY — DX: Personal history of other diseases of the digestive system: Z87.19

## 2017-11-08 HISTORY — DX: Gastro-esophageal reflux disease without esophagitis: K21.9

## 2017-11-08 HISTORY — PX: COLONOSCOPY WITH PROPOFOL: SHX5780

## 2017-11-08 HISTORY — PX: ESOPHAGOGASTRODUODENOSCOPY (EGD) WITH PROPOFOL: SHX5813

## 2017-11-08 SURGERY — ESOPHAGOGASTRODUODENOSCOPY (EGD) WITH PROPOFOL
Anesthesia: General

## 2017-11-08 MED ORDER — GLYCOPYRROLATE 0.2 MG/ML IJ SOLN
INTRAMUSCULAR | Status: AC
  Filled 2017-11-08: qty 1

## 2017-11-08 MED ORDER — SUCCINYLCHOLINE CHLORIDE 20 MG/ML IJ SOLN
INTRAMUSCULAR | Status: AC
  Filled 2017-11-08: qty 1

## 2017-11-08 MED ORDER — FENTANYL CITRATE (PF) 100 MCG/2ML IJ SOLN
INTRAMUSCULAR | Status: AC
  Filled 2017-11-08: qty 2

## 2017-11-08 MED ORDER — GLYCOPYRROLATE 0.2 MG/ML IJ SOLN
INTRAMUSCULAR | Status: DC | PRN
  Administered 2017-11-08: 0.2 mg via INTRAVENOUS

## 2017-11-08 MED ORDER — SODIUM CHLORIDE 0.9 % IV SOLN
INTRAVENOUS | Status: DC
  Administered 2017-11-08: 1000 mL via INTRAVENOUS

## 2017-11-08 MED ORDER — PROPOFOL 500 MG/50ML IV EMUL
INTRAVENOUS | Status: AC
  Filled 2017-11-08: qty 100

## 2017-11-08 MED ORDER — GLYCOPYRROLATE 0.2 MG/ML IJ SOLN
INTRAMUSCULAR | Status: AC
  Filled 2017-11-08: qty 2

## 2017-11-08 MED ORDER — SODIUM CHLORIDE 0.9 % IV SOLN: INTRAVENOUS | Status: DC

## 2017-11-08 MED ORDER — LIDOCAINE HCL (PF) 2 % IJ SOLN
INTRAMUSCULAR | Status: AC
  Filled 2017-11-08: qty 10

## 2017-11-08 MED ORDER — PROPOFOL 500 MG/50ML IV EMUL
INTRAVENOUS | Status: AC
  Filled 2017-11-08: qty 50

## 2017-11-08 MED ORDER — LIDOCAINE HCL (CARDIAC) 20 MG/ML IV SOLN
INTRAVENOUS | Status: DC | PRN
  Administered 2017-11-08: 60 mg via INTRAVENOUS

## 2017-11-08 MED ORDER — PROPOFOL 500 MG/50ML IV EMUL
INTRAVENOUS | Status: DC | PRN
  Administered 2017-11-08: 140 ug/kg/min via INTRAVENOUS

## 2017-11-08 MED ORDER — PROPOFOL 10 MG/ML IV BOLUS
INTRAVENOUS | Status: DC | PRN
  Administered 2017-11-08: 20 mg via INTRAVENOUS
  Administered 2017-11-08: 60 mg via INTRAVENOUS

## 2017-11-08 MED ORDER — FENTANYL CITRATE (PF) 100 MCG/2ML IJ SOLN
INTRAMUSCULAR | Status: DC | PRN
  Administered 2017-11-08: 50 ug via INTRAVENOUS

## 2017-11-08 NOTE — Anesthesia Preprocedure Evaluation (Signed)
Anesthesia Evaluation  Patient identified by MRN, date of birth, ID band Patient awake    Reviewed: Allergy & Precautions, NPO status , Patient's Chart, lab work & pertinent test results  History of Anesthesia Complications Negative for: history of anesthetic complications  Airway Mallampati: III  TM Distance: >3 FB Neck ROM: Full    Dental  (+) Implants   Pulmonary neg sleep apnea, neg COPD, former smoker,    breath sounds clear to auscultation- rhonchi (-) wheezing      Cardiovascular hypertension, + CAD, + Past MI and + Cardiac Stents (2 RCA stents in 2007)   Rhythm:Regular Rate:Normal - Systolic murmurs and - Diastolic murmurs    Neuro/Psych negative neurological ROS  negative psych ROS   GI/Hepatic Neg liver ROS, GERD  ,  Endo/Other  negative endocrine ROSneg diabetes  Renal/GU negative Renal ROS     Musculoskeletal  (+) Arthritis ,   Abdominal (+) + obese,   Peds  Hematology negative hematology ROS (+)   Anesthesia Other Findings Past Medical History: No date: Cholesterol blood lowered No date: Coronary artery disease No date: GERD (gastroesophageal reflux disease) No date: History of Barrett's esophagus No date: History of rectal bleeding 11/07: Myocardial infarction ([facility])     Comment:  With occlusion of RCA, stents x2   Reproductive/Obstetrics                             Anesthesia Physical Anesthesia Plan  ASA: III  Anesthesia Plan: General   Post-op Pain Management:    Induction: Intravenous  PONV Risk Score and Plan: 1 and Propofol infusion  Airway Management Planned: Natural Airway  Additional Equipment:   Intra-op Plan:   Post-operative Plan:   Informed Consent: I have reviewed the patients History and Physical, chart, labs and discussed the procedure including the risks, benefits and alternatives for the proposed anesthesia with the patient or authorized  representative who has indicated his/her understanding and acceptance.   Dental advisory given  Plan Discussed with: CRNA and Anesthesiologist  Anesthesia Plan Comments:         Anesthesia Quick Evaluation

## 2017-11-08 NOTE — Op Note (Signed)
[facility] Gastroenterology Patient Name: Patrick Barrera Procedure Date: 11/08/2017 7:33 AM MRN: [id number] Account #: [id number] Date of Birth: Jun 01, 1956 Admit Type: Outpatient Age: 62 Room: [facility] ENDO ROOM 3 Gender: Male Note Status: Finalized Procedure:            Upper GI endoscopy Indications:          Follow-up of Barrett's esophagus Providers:            Manya Silvas, MD Referring MD:         Kirstie Peri. Caryn Section, MD (Referring MD) Medicines:            Propofol per Anesthesia Complications:        No immediate complications. Procedure:            Pre-Anesthesia Assessment:                       - After reviewing the risks and benefits, the patient                        was deemed in satisfactory condition to undergo the                        procedure.                       After obtaining informed consent, the endoscope was                        passed under direct vision. Throughout the procedure,                        the patient's blood pressure, pulse, and oxygen                        saturations were monitored continuously. The Endoscope                        was introduced through the mouth, and advanced to the                        second part of duodenum. The upper GI endoscopy was                        accomplished without difficulty. The patient tolerated                        the procedure well. Findings:      There were esophageal mucosal changes secondary to established       short-segment Barrett's disease present in the lower third of the       esophagus. The maximum longitudinal extent of these mucosal changes was       3 cm in length. Mucosa was biopsied with a cold forceps for histology. 4       biopsies done. One specimen bottle was sent to pathology.      The entire examined stomach was normal.      The examined duodenum was normal. Impression:           - Esophageal mucosal changes secondary to established      short-segment Barrett's disease. Biopsied.                       -  Normal stomach.                       - Normal examined duodenum. Recommendation:       - Await pathology results. Take medicine. Manya Silvas, MD 11/08/2017 7:50:30 AM This report has been signed electronically. Number of Addenda: 0 Note Initiated On: 11/08/2017 7:33 AM      [facility]

## 2017-11-08 NOTE — H&P (Signed)
Primary Care Physician:  Birdie Sons, MD Primary Gastroenterologist:  Dr. Vira Agar  Pre-Procedure History & Physical: HPI:  Patrick Barrera is a 62 y.o. male is here for an endoscopy and colonoscopy.   Past Medical History:  Diagnosis Date  . Cholesterol blood lowered   . Coronary artery disease   . GERD (gastroesophageal reflux disease)   . History of Barrett's esophagus   . History of rectal bleeding   . Myocardial infarction ([place]) 11/07   With occlusion of RCA, stents x2    Past Surgical History:  Procedure Laterality Date  . APPENDECTOMY    . COLON SURGERY     hyperplastic colon polyp removal  . CORONARY STENT PLACEMENT  2007   x2  . TONSILLECTOMY AND ADENOIDECTOMY    . UPPER GI ENDOSCOPY  04/23/14   barretts esophagus, repeat 04/2017    Prior to Admission medications   Medication Sig Start Date End Date Taking? Authorizing Provider  albuterol (PROVENTIL HFA;VENTOLIN HFA) 108 (90 Base) MCG/ACT inhaler Inhale 2 puffs into the lungs every 6 (six) hours as needed for wheezing or shortness of breath. 03/21/17  Yes Minna Merritts, MD  aspirin 81 MG EC tablet Take 81 mg by mouth daily.     Yes [provider]  atorvastatin (LIPITOR) 80 MG tablet TAKE 1 TABLET (80 MG TOTAL) BY MOUTH DAILY. 11/19/16  Yes Minna Merritts, MD  atorvastatin (LIPITOR) 80 MG tablet TAKE 1 TABLET DAILY 10/21/17  Yes Minna Merritts, MD  clopidogrel (PLAVIX) 75 MG tablet TAKE 1 TABLET DAILY 05/30/17  Yes Gollan, Kathlene November, MD  doxylamine, Sleep, (UNISOM) 25 MG tablet Take 25 mg by mouth at bedtime.    Yes [provider]  LACTOBACILLUS ACID-PECTIN PO Take by mouth.   Yes [provider]  lansoprazole (PREVACID) 30 MG capsule Take by mouth.   Yes [provider]  lisinopril (PRINIVIL,ZESTRIL) 10 MG tablet TAKE 1 TABLET DAILY (PATIENT NEEDS TO CONTACT OFFICE TO SCHEDULE APPOINTMENT AND REFILLS) 05/30/17  Yes Gollan, Kathlene November, MD  metoprolol succinate  (TOPROL-XL) 50 MG 24 hr tablet TAKE 1 TABLET DAILY 05/30/17  Yes Gollan, Kathlene November, MD  nitroGLYCERIN (NITROSTAT) 0.4 MG SL tablet DISSOLVE 1 TABLET UNDER TONGUE EVERY 5 MINUTES IF NEEDED CHEST PAIN.CALL 911 IF NO RELIEF AFTER 3 DOSES Patient not taking: Reported on 11/08/2017 03/27/17   Minna Merritts, MD    Allergies as of 08/26/2017 - Review Complete 03/21/2017  Allergen Reaction Noted  . Atorvastatin  12/06/2015  . Codeine Nausea Only 08/13/2011  . Ezetimibe-simvastatin Other (See Comments) 12/28/2011    Family History  Problem Relation Age of Onset  . Heart disease Father        Stents placed  . Pulmonary fibrosis Father     Social History   Socioeconomic History  . Marital status: Married    Spouse name: Not on file  . Number of children: Not on file  . Years of education: Not on file  . Highest education level: Not on file  Social Needs  . Financial resource strain: Not on file  . Food insecurity - worry: Not on file  . Food insecurity - inability: Not on file  . Transportation needs - medical: Not on file  . Transportation needs - non-medical: Not on file  Occupational History  . Occupation: MACHINE OPERATOR    Employer: [employer]    Comment: Full time  Tobacco Use  . Smoking status: Current Every  Day Smoker    Packs/day: 1.00    Years: 45.00    Pack years: 45.00    Types: Cigarettes, E-cigarettes  . Smokeless tobacco: Never Used  . Tobacco comment: changed from cigarettes to Vaping in 2013. Previousl 1.5 ppd since 12  Substance and Sexual Activity  . Alcohol use: Yes    Alcohol/week: 0.5 oz    Types: 1 Standard drinks or equivalent per week    Comment: Occasional  . Drug use: No  . Sexual activity: Not on file  Other Topics Concern  . Not on file  Social History Narrative   No regular exercise.    Review of Systems: See HPI, otherwise negative ROS  Physical Exam: BP 107/71   Pulse 66   Temp (!) 96.6 F (35.9 C) (Tympanic)   Resp 16   Ht  5\' 10"  (1.778 m)   Wt 96.2 kg (212 lb)   SpO2 99%   BMI 30.42 kg/m  General:   Alert,  pleasant and cooperative in NAD Head:  Normocephalic and atraumatic. Neck:  Supple; no masses or thyromegaly. Lungs:  Clear throughout to auscultation.    Heart:  Regular rate and rhythm. Abdomen:  Soft, nontender and nondistended. Normal bowel sounds, without guarding, and without rebound.   Neurologic:  Alert and  oriented x4;  grossly normal neurologically.  Impression/Plan: Patrick Barrera is here for an endoscopy and colonoscopy to be performed for Barretts esophagus and rectal bleeding.  Risks, benefits, limitations, and alternatives regarding  endoscopy and colonoscopy have been reviewed with the patient.  Questions have been answered.  All parties agreeable.   Gaylyn Cheers, MD  11/08/2017, 7:24 AM

## 2017-11-08 NOTE — Anesthesia Post-op Follow-up Note (Signed)
Anesthesia QCDR form completed.        

## 2017-11-08 NOTE — Anesthesia Postprocedure Evaluation (Signed)
Anesthesia Post Note  Patient: Patrick Barrera  Procedure(s) Performed: ESOPHAGOGASTRODUODENOSCOPY (EGD) WITH PROPOFOL (N/A ) COLONOSCOPY WITH PROPOFOL (N/A )  Patient location during evaluation: Endoscopy Anesthesia Type: General Level of consciousness: awake and alert and oriented Pain management: pain level controlled Vital Signs Assessment: post-procedure vital signs reviewed and stable Respiratory status: spontaneous breathing, nonlabored ventilation and respiratory function stable Cardiovascular status: blood pressure returned to baseline and stable Postop Assessment: no signs of nausea or vomiting Anesthetic complications: no     Last Vitals:  Vitals:   11/08/17 0820 11/08/17 0830  BP: 94/67 93/67  Pulse: 80 73  Resp: 19 12  Temp:    SpO2: 97% 99%    Last Pain:  Vitals:   11/08/17 0830  TempSrc:   PainSc: 0-No pain                 Tabetha Haraway

## 2017-11-08 NOTE — Transfer of Care (Signed)
Immediate Anesthesia Transfer of Care Note  Patient: Patrick Barrera  Procedure(s) Performed: ESOPHAGOGASTRODUODENOSCOPY (EGD) WITH PROPOFOL (N/A ) COLONOSCOPY WITH PROPOFOL (N/A )  Patient Location: PACU  Anesthesia Type:General  Level of Consciousness: awake, alert  and oriented  Airway & Oxygen Therapy: Patient Spontanous Breathing and Patient connected to nasal cannula oxygen  Post-op Assessment: Report given to RN and Post -op Vital signs reviewed and stable  Post vital signs: Reviewed and stable  Last Vitals:  Vitals:   11/08/17 0812 11/08/17 0813  BP: (!) 90/54 (!) 90/54  Pulse:  80  Resp:  13  Temp:  (!) 36.1 C  SpO2:  96%    Last Pain:  Vitals:   11/08/17 0810  TempSrc: Tympanic         Complications: No apparent anesthesia complications

## 2017-11-08 NOTE — Op Note (Signed)
[facility] Gastroenterology Patient Name: Patrick Barrera Procedure Date: 11/08/2017 7:33 AM MRN: [id number] Account #: [id number] Date of Birth: 12/24/1955 Admit Type: Outpatient Age: 62 Room: [facility] ENDO ROOM 3 Gender: Male Note Status: Finalized Procedure:            Colonoscopy Indications:          Rectal bleeding Providers:            Manya Silvas, MD Referring MD:         Kirstie Peri. Caryn Section, MD (Referring MD) Medicines:            Propofol per Anesthesia Complications:        No immediate complications. Procedure:            Pre-Anesthesia Assessment:                       - After reviewing the risks and benefits, the patient                        was deemed in satisfactory condition to undergo the                        procedure.                       After obtaining informed consent, the colonoscope was                        passed under direct vision. Throughout the procedure,                        the patient's blood pressure, pulse, and oxygen                        saturations were monitored continuously. The Olympus                        PCF-H180AL colonoscope ( S#: [id number] ) was introduced                        through the anus and advanced to the the cecum,                        identified by appendiceal orifice and ileocecal valve.                        The colonoscopy was performed without difficulty. The                        patient tolerated the procedure well. The quality of                        the bowel preparation was good. Findings:      A diminutive polyp was found in the transverse colon. The polyp was       sessile. The polyp was removed with a jumbo cold forceps. Resection and       retrieval were complete.      Internal hemorrhoids were found during endoscopy. The hemorrhoids were       medium-sized and Grade I (internal hemorrhoids that do not prolapse).  A few small-mouthed diverticula were found in the descending  colon.      The exam was otherwise without abnormality. Impression:           - One diminutive polyp in the transverse colon, removed                        with a jumbo cold forceps. Resected and retrieved.                       - Internal hemorrhoids.                       - Diverticulosis in the descending colon.                       - The examination was otherwise normal. Recommendation:       - Await pathology results. Manya Silvas, MD 11/08/2017 8:11:00 AM This report has been signed electronically. Number of Addenda: 0 Note Initiated On: 11/08/2017 7:33 AM Scope Withdrawal Time: 0 hours 9 minutes 21 seconds  Total Procedure Duration: 0 hours 15 minutes 8 seconds       [facility]

## 2017-11-11 ENCOUNTER — Encounter: Payer: Self-pay | Admitting: Unknown Physician Specialty

## 2017-11-12 ENCOUNTER — Encounter: Payer: Self-pay | Admitting: Family Medicine

## 2017-11-12 DIAGNOSIS — Z86010 Personal history of colonic polyps: Secondary | ICD-10-CM | POA: Insufficient documentation

## 2017-11-12 LAB — SURGICAL PATHOLOGY

## 2018-02-18 ENCOUNTER — Ambulatory Visit (INDEPENDENT_AMBULATORY_CARE_PROVIDER_SITE_OTHER): Payer: BLUE CROSS/BLUE SHIELD | Admitting: Family Medicine

## 2018-02-18 ENCOUNTER — Encounter: Payer: Self-pay | Admitting: Family Medicine

## 2018-02-18 VITALS — BP 116/66 | HR 68 | Temp 98.3°F | Resp 16 | Ht 70.0 in | Wt 214.0 lb

## 2018-02-18 DIAGNOSIS — Z0000 Encounter for general adult medical examination without abnormal findings: Secondary | ICD-10-CM

## 2018-02-18 DIAGNOSIS — Z23 Encounter for immunization: Secondary | ICD-10-CM

## 2018-02-18 DIAGNOSIS — M6208 Separation of muscle (nontraumatic), other site: Secondary | ICD-10-CM

## 2018-02-18 DIAGNOSIS — I2510 Atherosclerotic heart disease of native coronary artery without angina pectoris: Secondary | ICD-10-CM

## 2018-02-18 DIAGNOSIS — F17210 Nicotine dependence, cigarettes, uncomplicated: Secondary | ICD-10-CM | POA: Diagnosis not present

## 2018-02-18 DIAGNOSIS — Z125 Encounter for screening for malignant neoplasm of prostate: Secondary | ICD-10-CM | POA: Diagnosis not present

## 2018-02-18 DIAGNOSIS — Z6830 Body mass index (BMI) 30.0-30.9, adult: Secondary | ICD-10-CM | POA: Diagnosis not present

## 2018-02-18 NOTE — Progress Notes (Signed)
Patient: Patrick Barrera, Male    DOB: 12-May-1956, 62 y.o.   MRN: [id number] Visit Date: 02/18/2018  Today's Provider: Lelon Huh, MD   Chief Complaint  Patient presents with  . Annual Exam  . Hyperlipidemia  . Coronary Artery Disease  . Hypertension   Subjective:    Annual physical exam [name] is a 62 y.o. male who presents today for health maintenance and complete physical. He feels fairly well. He reports never exercising. He reports he is sleeping fairly well.  -----------------------------------------------------------------  Hypertension, follow-up:  BP Readings from Last 3 Encounters:  11/08/17 93/67  05/13/17 128/72  03/21/17 130/60    He was last seen for hypertension 1 years ago.  BP at that visit was 128/82. Management since that visit includes no changes. He reports good compliance with treatment. He is not having side effects.  He is not exercising. He is adherent to low salt diet.   Outside blood pressures are not being checked. He is experiencing dyspnea.  Patient denies chest pain, chest pressure/discomfort, claudication, exertional chest pressure/discomfort, fatigue, irregular heart beat, lower extremity edema, near-syncope, orthopnea, palpitations, paroxysmal nocturnal dyspnea, syncope and tachypnea.   Cardiovascular risk factors include advanced age (older than 7 for men, 42 for women), dyslipidemia, hypertension and male gender.  Use of agents associated with hypertension: NSAIDS.     Weight trend: stable Wt Readings from Last 3 Encounters:  11/08/17 212 lb (96.2 kg)  05/13/17 215 lb (97.5 kg)  03/21/17 211 lb 8 oz (95.9 kg)    Current diet: in general, an "unhealthy" diet  ------------------------------------------------------------------------  Lipid/Cholesterol, Follow-up:   Last seen for this 1 years ago.  Management changes since that visit include none. . Last Lipid Panel:    Component Value Date/Time   CHOL 136 01/16/2017 0837   TRIG 181 (H) 01/16/2017 0837   HDL 27 (L) 01/16/2017 0837   CHOLHDL 5.0 01/16/2017 0837   CHOLHDL 5.1 12/27/2010 1700   VLDL 56 (H) 12/27/2010 1700   LDLCALC 73 01/16/2017 0837    Risk factors for vascular disease include hypercholesterolemia and hypertension  He reports good compliance with treatment. He is not having side effects.  Current symptoms include none and have been stable. Weight trend: stable Prior visit with dietician: no Current diet: in general, an "unhealthy" diet Current exercise: none  Wt Readings from Last 3 Encounters:  11/08/17 212 lb (96.2 kg)  05/13/17 215 lb (97.5 kg)  03/21/17 211 lb 8 oz (95.9 kg)    ------------------------------------------------------------------- Follow up Barrettes Esophagus: Patient was last seen for this problem 1 year ago and no changes were made. Patient was advised to continue regular follow up with Dr. Tiffany Kocher. Patient reports good compliance with treatment.   Follow up of CAD: Patient was last seen for this problem 1 year ago and no changes were made. Patient was advised to continue routine follow up with Dr. Rockey Situ. Patient reports good compliance with treatment.   Review of Systems  Constitutional: Negative for appetite change, chills, fatigue and fever.  HENT: Negative for congestion, ear pain, hearing loss, nosebleeds and trouble swallowing.   Eyes: Negative for pain and visual disturbance.  Respiratory: Positive for shortness of breath. Negative for cough and chest tightness.   Cardiovascular: Negative for chest pain, palpitations and leg swelling.  Gastrointestinal: Positive for diarrhea. Negative for abdominal pain, blood in stool, constipation, nausea and vomiting.  Endocrine: Negative for polydipsia, polyphagia and polyuria.  Genitourinary: Negative for  dysuria and flank pain.  Musculoskeletal: Negative for arthralgias, back pain, joint swelling, myalgias and neck stiffness.  Skin:  Negative for color change, rash and wound.  Neurological: Negative for dizziness, tremors, seizures, speech difficulty, weakness, light-headedness and headaches.  Psychiatric/Behavioral: Negative for behavioral problems, confusion, decreased concentration, dysphoric mood and sleep disturbance. The patient is not nervous/anxious.   All other systems reviewed and are negative.   Social History      He  reports that he has been smoking cigarettes and e-cigarettes.  He has a 45.00 pack-year smoking history. He has never used smokeless tobacco. He reports that he drinks about 0.5 oz of alcohol per week. He reports that he does not use drugs.       Social History   Socioeconomic History  . Marital status: Married    Spouse name: Not on file  . Number of children: 2  . Years of education: Not on file  . Highest education level: Not on file  Occupational History  . Occupation: MACHINE OPERATOR    Employer: [employer]    Comment: Full time  Social Needs  . Financial resource strain: Not on file  . Food insecurity:    Worry: Not on file    Inability: Not on file  . Transportation needs:    Medical: Not on file    Non-medical: Not on file  Tobacco Use  . Smoking status: Current Every Day Smoker    Packs/day: 1.00    Years: 45.00    Pack years: 45.00    Types: Cigarettes, E-cigarettes  . Smokeless tobacco: Never Used  . Tobacco comment: changed from cigarettes to Vaping in 2013. Previousl 1.5 ppd since 12  Substance and Sexual Activity  . Alcohol use: Yes    Alcohol/week: 0.5 oz    Types: 1 Standard drinks or equivalent per week    Comment: moderate  . Drug use: No  . Sexual activity: Not on file  Lifestyle  . Physical activity:    Days per week: Not on file    Minutes per session: Not on file  . Stress: Not on file  Relationships  . Social connections:    Talks on phone: Not on file    Gets together: Not on file    Attends religious service: Not on file    Active member  of club or organization: Not on file    Attends meetings of clubs or organizations: Not on file    Relationship status: Not on file  Other Topics Concern  . Not on file  Social History Narrative   No regular exercise.    Past Medical History:  Diagnosis Date  . Cholesterol blood lowered   . Coronary artery disease   . GERD (gastroesophageal reflux disease)   . History of Barrett's esophagus   . History of rectal bleeding   . Myocardial infarction ([place]) 11/07   With occlusion of RCA, stents x2     Patient Active Problem List   Diagnosis Date Noted  . History of adenomatous polyp of colon 11/12/2017  . Low testosterone in male 01/14/2017  . Barrett esophagus 12/06/2015  . Diverticulosis of colon 12/06/2015  . H/O acute myocardial infarction 12/06/2015  . Corrosion of second degree of head and neck 02/22/2014  . HTN (hypertension) 09/10/2011  . Hyperlipidemia 09/27/2010  . Smoking greater than 30 pack years 09/27/2010  . CAD, NATIVE VESSEL 09/27/2010  . ATHEROSCLEROSIS OF AORTA 09/27/2010  . Peripheral artery disease ([place]) 09/27/2010  .  ABDOMINAL BRUIT 09/27/2010  . Allergic rhinitis 11/30/2009  . ED (erectile dysfunction) of organic origin 11/19/2008  . 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 Acid reflux 10/08/2004  . Calcification of intervertebral cartilage or disc of cervical region 11/09/2003    Past Surgical History:  Procedure Laterality Date  . APPENDECTOMY    . COLON SURGERY     hyperplastic colon polyp removal  . COLONOSCOPY WITH PROPOFOL N/A 11/08/2017   Procedure: COLONOSCOPY WITH PROPOFOL;  Surgeon: Manya Silvas, MD;  Location: [facility] ENDOSCOPY;  Service: Endoscopy;  Laterality: N/A;  . CORONARY STENT PLACEMENT  2007   x2  . ESOPHAGOGASTRODUODENOSCOPY (EGD) WITH PROPOFOL N/A 11/08/2017   Procedure: ESOPHAGOGASTRODUODENOSCOPY (EGD) WITH PROPOFOL;  Surgeon: Manya Silvas, MD;  Location[facility] ENDOSCOPY;  Service: Endoscopy;  Laterality: N/A;  .  TONSILLECTOMY AND ADENOIDECTOMY    . UPPER GI ENDOSCOPY  04/23/14   barretts esophagus, repeat 04/2017    Family History        Family Status  Relation Name Status  . Father  Deceased at age 79  . Mother  Alive       Good health  . Sister  Alive  . Brother  Deceased       due to MVA        His family history includes Heart disease in his father; Pulmonary fibrosis in his father.      Allergies  Allergen Reactions  . Atorvastatin   . Codeine Nausea Only  . Ezetimibe-Simvastatin Other (See Comments)    Leg cramps     Current Outpatient Medications:  .  albuterol (PROVENTIL HFA;VENTOLIN HFA) 108 (90 Base) MCG/ACT inhaler, Inhale 2 puffs into the lungs every 6 (six) hours as needed for wheezing or shortness of breath., Disp: 1 Inhaler, Rfl: 2 .  aspirin 81 MG EC tablet, Take 81 mg by mouth daily.  , Disp: , Rfl:  .  atorvastatin (LIPITOR) 80 MG tablet, TAKE 1 TABLET (80 MG TOTAL) BY MOUTH DAILY., Disp: 30 tablet, Rfl: 0 .  clopidogrel (PLAVIX) 75 MG tablet, TAKE 1 TABLET DAILY, Disp: 90 tablet, Rfl: 3 .  doxylamine, Sleep, (UNISOM) 25 MG tablet, Take 25 mg by mouth at bedtime. , Disp: , Rfl:  .  LACTOBACILLUS ACID-PECTIN PO, Take by mouth., Disp: , Rfl:  .  lansoprazole (PREVACID) 30 MG capsule, Take by mouth., Disp: , Rfl:  .  lisinopril (PRINIVIL,ZESTRIL) 10 MG tablet, TAKE 1 TABLET DAILY (PATIENT NEEDS TO CONTACT OFFICE TO SCHEDULE APPOINTMENT AND REFILLS), Disp: 90 tablet, Rfl: 3 .  metoprolol succinate (TOPROL-XL) 50 MG 24 hr tablet, TAKE 1 TABLET DAILY, Disp: 90 tablet, Rfl: 3 .  nitroGLYCERIN (NITROSTAT) 0.4 MG SL tablet, DISSOLVE 1 TABLET UNDER TONGUE EVERY 5 MINUTES IF NEEDED CHEST PAIN.CALL 911 IF NO RELIEF AFTER 3 DOSES, Disp: 25 tablet, Rfl: 1 .  Probiotic Product (PROBIOTIC DAILY PO), Take 3 capsules by mouth daily., Disp: , Rfl:    Patient Care Team: Birdie Sons, MD as PCP - General (Family Medicine) Minna Merritts, MD as Consulting Physician  (Cardiology) Manya Silvas, MD (Gastroenterology)      Objective:   Vitals: BP 116/66 (BP Location: Left Arm, Patient Position: Sitting, Cuff Size: Large)   Pulse 68   Temp 98.3 F (36.8 C) (Oral)   Resp 16   Ht 5\' 10"  (1.778 m)   Wt 214 lb (97.1 kg)   SpO2 97% Comment: room air  BMI 30.71 kg/m      Physical  Exam   General Appearance:    Alert, cooperative, no distress, appears stated age  Head:    Normocephalic, without obvious abnormality, atraumatic  Eyes:    PERRL, conjunctiva/corneas clear, EOM's intact, fundi    benign, both eyes       Ears:    Normal TM's and external ear canals, both ears  Nose:   Nares normal, septum midline, mucosa normal, no drainage   or sinus tenderness  Throat:   Lips, mucosa, and tongue normal; teeth and gums normal  Neck:   Supple, symmetrical, trachea midline, no adenopathy;       thyroid:  No enlargement/tenderness/nodules; no carotid   bruit or JVD  Back:     Symmetric, no curvature, ROM normal, no CVA tenderness  Lungs:     Clear to auscultation bilaterally, respirations unlabored  Chest wall:    No tenderness or deformity  Heart:    Regular rate and rhythm, S1 and S2 normal, no murmur, rub   or gallop  Abdomen:     Soft, non-tender, bowel sounds active all four quadrants,    no masses, no organomegaly  Genitalia:    deferred  Rectal:    deferred  Extremities:   Extremities normal, atraumatic, no cyanosis or edema  Pulses:   2+ and symmetric all extremities  Skin:   Skin color, texture, turgor normal, no rashes or lesions  Lymph nodes:   Cervical, supraclavicular, and axillary nodes normal  Neurologic:   CNII-XII intact. Normal strength, sensation and reflexes      throughout    Depression Screen PHQ 2/9 Scores 02/18/2018 01/14/2017 01/14/2017 12/09/2015  PHQ - 2 Score 0 0 0 0  PHQ- 9 Score - 0 - 1      Assessment & Plan:     Routine Health Maintenance and Physical Exam  Exercise Activities and Dietary  recommendations Goals    None      Immunization History  Administered Date(s) Administered  . Pneumococcal Polysaccharide-23 01/14/2017  . Tdap 12/05/2007  . Zoster 12/09/2015    Health Maintenance  Topic Date Due  . HIV Screening  05/22/1969  . TETANUS/TDAP  12/04/2017  . INFLUENZA VACCINE  05/08/2018  . COLONOSCOPY  11/08/2022  . Hepatitis C Screening  Completed     Discussed health benefits of physical activity, and encouraged him to engage in regular exercise appropriate for his age and condition.    --------------------------------------------------------------------  1. Annual physical exam Doing well. Up to date on HM  2. BMI 30.0-30.9,adult Counseled regarding prudent diet and regular exercise.   3. Need for tetanus booster  - Td : Tetanus/diphtheria >7yo Preservative  free  4. Diastasis of rectus abdominis   5. Prostate cancer screening  - PSA  6. Smoking greater than 30 pack years  - Ambulatory Referral for Lung Cancer Scre  7. Atherosclerosis of native coronary artery of native heart without angina pectoris Asymptomatic. Compliant with medication.  Continue aggressive risk factor modification.   - Comprehensive metabolic panel - Lipid panel   Lelon Huh, MD  [facility]

## 2018-02-18 NOTE — Patient Instructions (Signed)
Diastasis Recti Diastasis recti is when the muscles of the abdomen (rectus abdominis muscles) become thin and separate. The result is a wider space between the right and left abdomen (abdominal) muscles. This wider space between the muscles may cause a bulge in the middle of your abdomen. You may notice this bulge when you are straining or when you sit up from a lying down position. Diastasis recti can affect men and women. It is most common among pregnant women, infants, people who are obese, and people who have had abdominal surgery. Exercise or surgical treatment may help correct it. What are the causes? Common causes of this condition include:  Pregnancy. The growing uterus puts pressure on the abdominal muscles, which causes the muscles to separate.  Obesity. Excess fat puts pressure on abdominal muscles.  Weightlifting.  Some abdomen exercises.  Advanced age.  Genetics.  Prior abdominal surgery.  What increases the risk? This condition is more likely to develop in:  Women.  Newborns, especially newborns who are born early (prematurely).  What are the signs or symptoms? Common symptoms of this condition include:  A bulge in the middle of the abdomen. You will notice it most when you sit up or strain.  Pain in the low back, pelvis, or hips.  Constipation.  Inability to control when you urinate (urinary incontinence).  Bloating.  Poor posture.  How is this diagnosed? This condition is diagnosed with a physical exam. Your health care provider will ask you to lie flat on your back and do a crunch or half sit-up. If you have diastasis recti, a vertical bulge will appear between your abdominal muscles in the center of your abdomen. Your health care provider will measure the gap between your muscles with one of the following:  A medical device used to measure the space between two objects (caliper).  A tape measure.  CT scan.  Ultrasound.  Finger spaces. Your health  care provider will measure the space using their fingers.  How is this treated? If your muscle separation is not too large, you may not need treatment. However, if you are a woman who plans to become pregnant again, you should treat this condition before your next pregnancy. Treatment may include:  Physical therapy to strengthen and tighten your abdominal muscles.  Lifestyle changes such as weight loss and exercise.  Over-the-counter pain medicines as needed.  Surgery to correct the separation.  Follow these instructions at home: Activity  Return to your normal activities as told by your health care provider. Ask your health care provider what activities are safe for you.  When lifting weights or doing exercises using your abdominal muscles or the muscles in the center of your body that give stability (core muscles), make sure you are doing your exercises and movements correctly. Proper form can help to prevent the condition from happening again. General instructions  If you are overweight, ask your health care provider for help with weight loss. Losing even a small amount of weight can help to improve your diastasis recti.  Take over-the-counter or prescription medicines only as told by your health care provider.  Do not strain. Straining can make the separation worse. Examples of straining include: ? Pushing hard to have a bowel movement, such as due to constipation. ? Lifting heavy objects, including children. ? Standing up and sitting down.  Take steps to prevent constipation: ? Drink enough fluid to keep your urine clear or pale yellow. ? Take over-the-counter or prescription medicines only as   directed. ? Eat foods that are high in fiber, such as fresh fruits and vegetables, whole grains, and beans. ? Limit foods that are high in fat and processed sugars, such as fried and sweet foods. Contact a health care provider if:  You notice a new bulge in your abdomen. Get help  right away if:  You experience severe discomfort in your abdomen.  You develop severe abdominal pain along with nausea, vomiting, or fever. Summary  Diastasis recti is when the abdomen (abdominal) muscles become thin and separate. Your abdomen will stick out because the space between your right and left abdomen muscles has widened.  The most common symptom is a bulge in your abdomen. You will notice it most when you sit up or are straining.  This condition is diagnosed during a physical exam.  If the abdomen separation is not too big, you may choose not to have treatment. Otherwise, you may need to undergo physical therapy or surgery. This information is not intended to replace advice given to you by your health care provider. Make sure you discuss any questions you have with your health care provider. Document Released: 11/19/2016 Document Revised: 11/19/2016 Document Reviewed: 11/19/2016 Elsevier Interactive Patient Education  2018 [employer]  

## 2018-02-19 ENCOUNTER — Telehealth: Payer: Self-pay | Admitting: *Deleted

## 2018-02-19 DIAGNOSIS — I2510 Atherosclerotic heart disease of native coronary artery without angina pectoris: Secondary | ICD-10-CM | POA: Diagnosis not present

## 2018-02-19 DIAGNOSIS — Z125 Encounter for screening for malignant neoplasm of prostate: Secondary | ICD-10-CM | POA: Diagnosis not present

## 2018-02-19 NOTE — Telephone Encounter (Signed)
Received referral for low dose lung cancer screening CT scan. Message left at phone number listed in EMR for patient to call me back to facilitate scheduling scan.  

## 2018-02-20 LAB — LIPID PANEL
CHOLESTEROL TOTAL: 122 mg/dL (ref 100–199)
Chol/HDL Ratio: 4.9 ratio (ref 0.0–5.0)
HDL: 25 mg/dL — ABNORMAL LOW (ref >39)
LDL CALC: 55 mg/dL (ref 0–99)
Triglycerides: 212 mg/dL — ABNORMAL HIGH (ref 0–149)
VLDL Cholesterol Cal: 42 mg/dL — ABNORMAL HIGH (ref 5–40)

## 2018-02-20 LAB — COMPREHENSIVE METABOLIC PANEL
ALBUMIN: 4 g/dL (ref 3.6–4.8)
ALK PHOS: 88 IU/L (ref 39–117)
ALT: 32 IU/L (ref 0–44)
AST: 23 IU/L (ref 0–40)
Albumin/Globulin Ratio: 1.4 (ref 1.2–2.2)
BILIRUBIN TOTAL: 0.4 mg/dL (ref 0.0–1.2)
BUN / CREAT RATIO: 20 (ref 10–24)
BUN: 17 mg/dL (ref 8–27)
CHLORIDE: 106 mmol/L (ref 96–106)
CO2: 21 mmol/L (ref 20–29)
CREATININE: 0.87 mg/dL (ref 0.76–1.27)
Calcium: 8.8 mg/dL (ref 8.6–10.2)
GFR calc Af Amer: 106 mL/min/{1.73_m2} (ref >59)
GFR calc non Af Amer: 92 mL/min/{1.73_m2} (ref >59)
GLOBULIN, TOTAL: 2.8 g/dL (ref 1.5–4.5)
GLUCOSE: 108 mg/dL — AB (ref 65–99)
Potassium: 4.3 mmol/L (ref 3.5–5.2)
SODIUM: 142 mmol/L (ref 134–144)
Total Protein: 6.8 g/dL (ref 6.0–8.5)

## 2018-02-20 LAB — PSA: Prostate Specific Ag, Serum: 0.7 ng/mL (ref 0.0–4.0)

## 2018-02-26 ENCOUNTER — Telehealth: Payer: Self-pay | Admitting: *Deleted

## 2018-02-26 NOTE — Telephone Encounter (Signed)
Received referral for low dose lung cancer screening CT scan. Message left at phone number listed in EMR for patient to call me back to facilitate scheduling scan.  

## 2018-02-28 ENCOUNTER — Other Ambulatory Visit: Payer: Self-pay | Admitting: Cardiovascular Disease

## 2018-03-18 ENCOUNTER — Telehealth: Payer: Self-pay | Admitting: *Deleted

## 2018-03-18 NOTE — Telephone Encounter (Signed)
Received referral for low dose lung cancer screening CT scan. Message left at phone number listed in EMR for patient to call me back to facilitate scheduling scan.  

## 2018-04-18 ENCOUNTER — Encounter: Payer: Self-pay | Admitting: *Deleted

## 2018-05-26 ENCOUNTER — Other Ambulatory Visit: Payer: Self-pay | Admitting: Cardiovascular Disease

## 2018-05-27 ENCOUNTER — Telehealth: Payer: Self-pay | Admitting: Cardiovascular Disease

## 2018-05-27 NOTE — Telephone Encounter (Signed)
Lmov for patient to call back and schedule  °

## 2018-05-27 NOTE — Telephone Encounter (Signed)
-----   Message from Alba Destine, [place] sent at 05/26/2018 10:49 AM EDT ----- Please contact patient for follow up past due for follow up appointment.

## 2018-05-30 NOTE — Telephone Encounter (Signed)
Patient is scheduled 07/02/18 with Dr Rockey Situ

## 2018-06-29 DIAGNOSIS — L03113 Cellulitis of right upper limb: Secondary | ICD-10-CM | POA: Diagnosis not present

## 2018-07-02 ENCOUNTER — Ambulatory Visit: Payer: BLUE CROSS/BLUE SHIELD | Admitting: Cardiovascular Disease

## 2018-07-07 DIAGNOSIS — Z87898 Personal history of other specified conditions: Secondary | ICD-10-CM | POA: Diagnosis not present

## 2018-07-07 DIAGNOSIS — R143 Flatulence: Secondary | ICD-10-CM | POA: Diagnosis not present

## 2018-07-07 DIAGNOSIS — R7989 Other specified abnormal findings of blood chemistry: Secondary | ICD-10-CM | POA: Diagnosis not present

## 2018-07-13 NOTE — Progress Notes (Signed)
Patient ID: Patrick Barrera, male   DOB: [birth year], 62 y.o.   MRN: [id number] Cardiology Office Note  Date:  07/14/2018   ID:  Patrick Barrera, [place] 10/13/1955, MRN [id number]  PCP:  Birdie Sons, MD   Chief Complaint  Patient presents with  . OTHER    OD 12 month f/u no complaints today. Meds reviewed verbally with pt.    HPI:  Patrick Barrera is a 62 year old gentleman with  HTN,  coronary artery disease,  occlusion of his RCA in November 2007 with 2 bare-metal stents placed at that time also with 70% mid LAD lesion that was not intervened upon,  long smoking history who stopped in October 2013,  COPD hyperlipidemia  who presents for routine followup of his coronary artery disease  Denies any chest burning, no shortness of breath Active at work, no regular exercise program No medication intolerances   He spends time with his grandkids No significant bleeding on aspirin Plavix  EKG personally reviewed by myself on todays visit Shows normal sinus rhythm rate 66 bpm no significant ST or T wave changes    PMH:   has a past medical history of Cholesterol blood lowered, Coronary artery disease, GERD (gastroesophageal reflux disease), History of Barrett's esophagus, History of rectal bleeding, and Myocardial infarction ([place]) (11/07).  PSH:    Past Surgical History:  Procedure Laterality Date  . APPENDECTOMY    . COLON SURGERY     hyperplastic colon polyp removal  . COLONOSCOPY WITH PROPOFOL N/A 11/08/2017   Procedure: COLONOSCOPY WITH PROPOFOL;  Surgeon: Manya Silvas, MD;  Location: [facility] ENDOSCOPY;  Service: Endoscopy;  Laterality: N/A;  . CORONARY STENT PLACEMENT  2007   x2  . ESOPHAGOGASTRODUODENOSCOPY (EGD) WITH PROPOFOL N/A 11/08/2017   Procedure: ESOPHAGOGASTRODUODENOSCOPY (EGD) WITH PROPOFOL;  Surgeon: Manya Silvas, MD;  Location: [facility] ENDOSCOPY;  Service: Endoscopy;  Laterality: N/A;  . TONSILLECTOMY AND ADENOIDECTOMY    . UPPER GI ENDOSCOPY  04/23/14   barretts  esophagus, repeat 04/2017    Current Outpatient Medications  Medication Sig Dispense Refill  . albuterol (PROVENTIL HFA;VENTOLIN HFA) 108 (90 Base) MCG/ACT inhaler Inhale 2 puffs into the lungs every 6 (six) hours as needed for wheezing or shortness of breath. 1 Inhaler 2  . aspirin 81 MG EC tablet Take 81 mg by mouth daily.      Marland Kitchen atorvastatin (LIPITOR) 80 MG tablet TAKE 1 TABLET (80 MG TOTAL) BY MOUTH DAILY. 30 tablet 0  . clopidogrel (PLAVIX) 75 MG tablet TAKE 1 TABLET DAILY 90 tablet 0  . diphenhydrAMINE (BENADRYL) 25 MG tablet Take 25 mg by mouth at bedtime as needed.    Marland Kitchen LACTOBACILLUS ACID-PECTIN PO Take by mouth.    . lansoprazole (PREVACID) 30 MG capsule Take by mouth.    Marland Kitchen lisinopril (PRINIVIL,ZESTRIL) 10 MG tablet TAKE 1 TABLET DAILY (PATIENT NEEDS TO CONTACT OFFICE TO SCHEDULE APPOINTMENT AND REFILLS) 90 tablet 0  . metoprolol succinate (TOPROL-XL) 50 MG 24 hr tablet TAKE 1 TABLET DAILY 90 tablet 0  . nitroGLYCERIN (NITROSTAT) 0.4 MG SL tablet DISSOLVE 1 TABLET UNDER TONGUE EVERY 5 MIN AS NEED FOR CHEST PAIN. CALL 911 IF NO RELIEF AFTER 3DOSE 25 tablet 1   No current facility-administered medications for this visit.      Allergies:   Atorvastatin; Codeine; and Ezetimibe-simvastatin   Social History:  The patient  reports that he has been smoking cigarettes and e-cigarettes. He has a 45.00 pack-year smoking history. He has never used  smokeless tobacco. He reports that he drinks about 1.0 standard drinks of alcohol per week. He reports that he does not use drugs.   Family History:   family history includes Heart disease in his father; Pulmonary fibrosis in his father.    Review of Systems: Review of Systems  Constitutional: Negative.   Respiratory: Negative.   Cardiovascular: Negative.   Gastrointestinal: Negative.   Musculoskeletal: Negative.   Neurological: Negative.   Psychiatric/Behavioral: Negative.   All other systems reviewed and are negative.   PHYSICAL  EXAM: VS:  BP 124/82 (BP Location: Left Arm, Patient Position: Sitting, Cuff Size: Normal)   Pulse 66   Ht 5\' 10"  (1.778 m)   Wt 216 lb (98 kg)   BMI 30.99 kg/m  , BMI Body mass index is 30.99 kg/m. Constitutional:  oriented to person, place, and time. No distress.  HENT:  Head: Normocephalic and atraumatic.  Eyes:  no discharge. No scleral icterus.  Neck: Normal range of motion. Neck supple. No JVD present.  Cardiovascular: Normal rate, regular rhythm, normal heart sounds and intact distal pulses. Exam reveals no gallop and no friction rub. No edema No murmur heard. Pulmonary/Chest: Effort normal and breath sounds normal. No stridor. No respiratory distress.  no wheezes.  no rales.  no tenderness.  Abdominal: Soft.  no distension.  no tenderness.  Musculoskeletal: Normal range of motion.  no  tenderness or deformity.  Neurological:  normal muscle tone. Coordination normal. No atrophy Skin: Skin is warm and dry. No rash noted. not diaphoretic.  Psychiatric:  normal mood and affect. behavior is normal. Thought content normal.    Recent Labs: 02/19/2018: ALT 32; BUN 17; Creatinine, Ser 0.87; Potassium 4.3; Sodium 142    Lipid Panel Lab Results  Component Value Date   CHOL 122 02/19/2018   HDL 25 (L) 02/19/2018   LDLCALC 55 02/19/2018   TRIG 212 (H) 02/19/2018      Wt Readings from Last 3 Encounters:  07/14/18 216 lb (98 kg)  02/18/18 214 lb (97.1 kg)  11/08/17 212 lb (96.2 kg)      ASSESSMENT AND PLAN:  Atherosclerosis of native coronary artery of native heart without angina pectoris - Plan: EKG 12-Lead Currently with no symptoms of angina. No further workup at this time. Continue current medication regimen.  Stable  Essential hypertension - Plan: EKG 12-Lead Blood pressure is well controlled on today's visit. No changes made to the medications. Stable  SMOKER Stop smoking several years ago, 2013 Unable to exclude mild COPD  Hyperlipidemia Cholesterol is at goal  on the current lipid regimen. No changes to the medications were made.  No changes/stable   Total encounter time more than 15 minutes  Greater than 50% was spent in counseling and coordination of care with the patient   Disposition:   F/U  12 months   Orders Placed This Encounter  Procedures  . EKG 12-Lead     Signed, Esmond Plants, M.D., Ph.D. 07/14/2018  [place], [place]

## 2018-07-14 ENCOUNTER — Ambulatory Visit (INDEPENDENT_AMBULATORY_CARE_PROVIDER_SITE_OTHER): Payer: BLUE CROSS/BLUE SHIELD | Admitting: Cardiovascular Disease

## 2018-07-14 ENCOUNTER — Encounter: Payer: Self-pay | Admitting: Cardiovascular Disease

## 2018-07-14 VITALS — BP 124/82 | HR 66 | Ht 70.0 in | Wt 216.0 lb

## 2018-07-14 DIAGNOSIS — I25118 Atherosclerotic heart disease of native coronary artery with other forms of angina pectoris: Secondary | ICD-10-CM | POA: Diagnosis not present

## 2018-07-14 DIAGNOSIS — I739 Peripheral vascular disease, unspecified: Secondary | ICD-10-CM | POA: Diagnosis not present

## 2018-07-14 DIAGNOSIS — Z23 Encounter for immunization: Secondary | ICD-10-CM | POA: Diagnosis not present

## 2018-07-14 DIAGNOSIS — E782 Mixed hyperlipidemia: Secondary | ICD-10-CM

## 2018-07-14 DIAGNOSIS — I10 Essential (primary) hypertension: Secondary | ICD-10-CM | POA: Diagnosis not present

## 2018-07-14 NOTE — Patient Instructions (Signed)

## 2018-07-17 DIAGNOSIS — R197 Diarrhea, unspecified: Secondary | ICD-10-CM | POA: Diagnosis not present

## 2018-07-25 DIAGNOSIS — M25532 Pain in left wrist: Secondary | ICD-10-CM | POA: Diagnosis not present

## 2018-07-25 DIAGNOSIS — G5602 Carpal tunnel syndrome, left upper limb: Secondary | ICD-10-CM | POA: Diagnosis not present

## 2018-08-06 DIAGNOSIS — R200 Anesthesia of skin: Secondary | ICD-10-CM | POA: Diagnosis not present

## 2018-08-06 DIAGNOSIS — R202 Paresthesia of skin: Secondary | ICD-10-CM | POA: Diagnosis not present

## 2018-08-21 DIAGNOSIS — K591 Functional diarrhea: Secondary | ICD-10-CM | POA: Diagnosis not present

## 2018-08-24 ENCOUNTER — Other Ambulatory Visit: Payer: Self-pay | Admitting: Cardiovascular Disease

## 2018-09-09 DIAGNOSIS — G5602 Carpal tunnel syndrome, left upper limb: Secondary | ICD-10-CM | POA: Diagnosis not present

## 2018-09-11 ENCOUNTER — Encounter
Admission: RE | Admit: 2018-09-11 | Discharge: 2018-09-11 | Disposition: A | Discharge status: Home / Self Care | Payer: BLUE CROSS/BLUE SHIELD | Source: Ambulatory Visit | Attending: Orthopedic Surgery | Admitting: Orthopedic Surgery

## 2018-09-11 ENCOUNTER — Other Ambulatory Visit: Payer: Self-pay

## 2018-09-11 DIAGNOSIS — Z01812 Encounter for preprocedural laboratory examination: Secondary | ICD-10-CM | POA: Insufficient documentation

## 2018-09-11 HISTORY — DX: Hyperlipidemia, unspecified: E78.5

## 2018-09-11 HISTORY — DX: Essential (primary) hypertension: I10

## 2018-09-11 NOTE — Patient Instructions (Signed)
Your procedure is scheduled on: 09/22/18 Mon Report to Same Day Surgery 2nd floor medical mall [facility] Entrance-take elevator on left to 2nd floor.  Check in with surgery information desk.) To find out your arrival time please call [phone] between 1PM - 3PM on 09/19/18 Fri  Remember: Instructions that are not followed completely may result in serious medical risk, up to and including death, or upon the discretion of your surgeon and anesthesiologist your surgery may need to be rescheduled.    _x___ 1. Do not eat food after midnight the night before your procedure. You may drink clear liquids up to 2 hours before you are scheduled to arrive at the hospital for your procedure.  Do not drink clear liquids within 2 hours of your scheduled arrival to the hospital.  Clear liquids include  --Water or Apple juice without pulp  --Clear carbohydrate beverage such as ClearFast or Gatorade  --Black Coffee or Clear Tea (No milk, no creamers, do not add anything to                  the coffee or Tea Type 1 and type 2 diabetics should only drink water.   ____Ensure clear carbohydrate drink on the way to the hospital for bariatric patients  ____Ensure clear carbohydrate drink 3 hours before surgery for Dr Dwyane Luo patients if physician instructed.   No gum chewing or hard candies.     __x__ 2. No Alcohol for 24 hours before or after surgery.   __x__3. No Smoking or e-cigarettes for 24 prior to surgery.  Do not use any chewable tobacco products for at least 6 hour prior to surgery   ____  4. Bring all medications with you on the day of surgery if instructed.    __x__ 5. Notify your doctor if there is any change in your medical condition     (cold, fever, infections).    x___6. On the morning of surgery brush your teeth with toothpaste and water.  You may rinse your mouth with mouth wash if you wish.  Do not swallow any toothpaste or mouthwash.   Do not wear jewelry, make-up, hairpins,  clips or nail polish.  Do not wear lotions, powders, or perfumes. You may wear deodorant.  Do not shave 48 hours prior to surgery. Men may shave face and neck.  Do not bring valuables to the hospital.    [facility] is not responsible for any belongings or valuables.               Contacts, dentures or bridgework may not be worn into surgery.  Leave your suitcase in the car. After surgery it may be brought to your room.  For patients admitted to the hospital, discharge time is determined by your                       treatment team.  _  Patients discharged the day of surgery will not be allowed to drive home.  You will need someone to drive you home and stay with you the night of your procedure.    Please read over the following fact sheets that you were given:   [facility] Preparing for Surgery and or MRSA Information   _x___ Take anti-hypertensive listed below, cardiac, seizure, asthma,     anti-reflux and psychiatric medicines. These include:  1. esomeprazole (NEXIUM)   2.metoprolol succinate (TOPROL-XL) 50 MG 24 hr tablet  3.  4.  5.  6.  ____Fleets enema or Magnesium Citrate as directed.   _x___ Use CHG Soap or sage wipes as directed on instruction sheet   ____ Use inhalers on the day of surgery and bring to hospital day of surgery  ____ Stop Metformin and Janumet 2 days prior to surgery.    ____ Take 1/2 of usual insulin dose the night before surgery and none on the morning     surgery.   _x___ Follow recommendations from Cardiologist, Pulmonologist or PCP regarding          stopping Aspirin, Coumadin, Plavix ,Eliquis, Effient, or Pradaxa, and Pletal.  X____Stop Anti-inflammatories such as Advil, Aleve, Ibuprofen, Motrin, Naproxen, Naprosyn, Goodies powders or aspirin products. OK to take Tylenol and                          Celebrex.   _x___ Stop supplements until after surgery.  But may continue Vitamin D, Vitamin B,       and multivitamin.   ____ Bring C-Pap to the  hospital.

## 2018-09-22 ENCOUNTER — Ambulatory Visit: Payer: BLUE CROSS/BLUE SHIELD | Admitting: Anesthesiology

## 2018-09-22 ENCOUNTER — Ambulatory Visit
Admission: RE | Admit: 2018-09-22 | Discharge: 2018-09-22 | Disposition: A | Discharge status: Home / Self Care | Payer: BLUE CROSS/BLUE SHIELD | Source: Ambulatory Visit | Attending: Orthopedic Surgery | Admitting: Orthopedic Surgery

## 2018-09-22 ENCOUNTER — Encounter
Admission: RE | Disposition: A | Discharge status: Home / Self Care | Payer: Self-pay | Source: Ambulatory Visit | Attending: Orthopedic Surgery

## 2018-09-22 ENCOUNTER — Other Ambulatory Visit: Payer: Self-pay

## 2018-09-22 ENCOUNTER — Encounter: Payer: Self-pay | Admitting: Orthopedic Surgery

## 2018-09-22 DIAGNOSIS — Z9889 Other specified postprocedural states: Secondary | ICD-10-CM

## 2018-09-22 DIAGNOSIS — G5602 Carpal tunnel syndrome, left upper limb: Secondary | ICD-10-CM | POA: Diagnosis not present

## 2018-09-22 DIAGNOSIS — Z7902 Long term (current) use of antithrombotics/antiplatelets: Secondary | ICD-10-CM | POA: Diagnosis not present

## 2018-09-22 DIAGNOSIS — Z79899 Other long term (current) drug therapy: Secondary | ICD-10-CM | POA: Diagnosis not present

## 2018-09-22 DIAGNOSIS — I252 Old myocardial infarction: Secondary | ICD-10-CM | POA: Insufficient documentation

## 2018-09-22 DIAGNOSIS — I2510 Atherosclerotic heart disease of native coronary artery without angina pectoris: Secondary | ICD-10-CM | POA: Diagnosis not present

## 2018-09-22 DIAGNOSIS — I10 Essential (primary) hypertension: Secondary | ICD-10-CM | POA: Diagnosis not present

## 2018-09-22 DIAGNOSIS — E785 Hyperlipidemia, unspecified: Secondary | ICD-10-CM | POA: Diagnosis not present

## 2018-09-22 DIAGNOSIS — Z955 Presence of coronary angioplasty implant and graft: Secondary | ICD-10-CM | POA: Diagnosis not present

## 2018-09-22 DIAGNOSIS — Z7982 Long term (current) use of aspirin: Secondary | ICD-10-CM | POA: Diagnosis not present

## 2018-09-22 DIAGNOSIS — K219 Gastro-esophageal reflux disease without esophagitis: Secondary | ICD-10-CM | POA: Insufficient documentation

## 2018-09-22 DIAGNOSIS — I739 Peripheral vascular disease, unspecified: Secondary | ICD-10-CM | POA: Insufficient documentation

## 2018-09-22 DIAGNOSIS — Z885 Allergy status to narcotic agent status: Secondary | ICD-10-CM | POA: Insufficient documentation

## 2018-09-22 DIAGNOSIS — Z888 Allergy status to other drugs, medicaments and biological substances status: Secondary | ICD-10-CM | POA: Insufficient documentation

## 2018-09-22 DIAGNOSIS — Z87891 Personal history of nicotine dependence: Secondary | ICD-10-CM | POA: Diagnosis not present

## 2018-09-22 HISTORY — PX: CARPAL TUNNEL RELEASE: SHX101

## 2018-09-22 SURGERY — CARPAL TUNNEL RELEASE
Anesthesia: General | Laterality: Left

## 2018-09-22 MED ORDER — ONDANSETRON HCL 4 MG/2ML IJ SOLN: 4.0000 mg | Freq: Once | INTRAMUSCULAR | Status: DC | PRN

## 2018-09-22 MED ORDER — HYDROCODONE-ACETAMINOPHEN 5-325 MG PO TABS: 1.0000 | ORAL_TABLET | ORAL | 0 refills | Status: DC | PRN

## 2018-09-22 MED ORDER — FENTANYL CITRATE (PF) 100 MCG/2ML IJ SOLN
INTRAMUSCULAR | Status: AC
  Filled 2018-09-22: qty 2

## 2018-09-22 MED ORDER — PROPOFOL 10 MG/ML IV BOLUS
INTRAVENOUS | Status: DC | PRN
  Administered 2018-09-22: 150 mg via INTRAVENOUS

## 2018-09-22 MED ORDER — MIDAZOLAM HCL 5 MG/5ML IJ SOLN
INTRAMUSCULAR | Status: DC | PRN
  Administered 2018-09-22: 2 mg via INTRAVENOUS

## 2018-09-22 MED ORDER — ONDANSETRON HCL 4 MG PO TABS: 4.0000 mg | ORAL_TABLET | Freq: Four times a day (QID) | ORAL | Status: DC | PRN

## 2018-09-22 MED ORDER — ACETAMINOPHEN 10 MG/ML IV SOLN
INTRAVENOUS | Status: DC | PRN
  Administered 2018-09-22: 1000 mg via INTRAVENOUS

## 2018-09-22 MED ORDER — FENTANYL CITRATE (PF) 100 MCG/2ML IJ SOLN
INTRAMUSCULAR | Status: DC | PRN
  Administered 2018-09-22 (×2): 50 ug via INTRAVENOUS

## 2018-09-22 MED ORDER — SODIUM CHLORIDE 0.9 % IV SOLN
INTRAVENOUS | Status: DC | PRN
  Administered 2018-09-22: 1 mL

## 2018-09-22 MED ORDER — ACETAMINOPHEN 10 MG/ML IV SOLN
INTRAVENOUS | Status: AC
  Filled 2018-09-22: qty 100

## 2018-09-22 MED ORDER — GLYCOPYRROLATE 0.2 MG/ML IJ SOLN
INTRAMUSCULAR | Status: DC | PRN
  Administered 2018-09-22: 0.2 mg via INTRAVENOUS

## 2018-09-22 MED ORDER — BUPIVACAINE HCL (PF) 0.25 % IJ SOLN
INTRAMUSCULAR | Status: DC | PRN
  Administered 2018-09-22: 15 mL

## 2018-09-22 MED ORDER — FENTANYL CITRATE (PF) 100 MCG/2ML IJ SOLN: 25.0000 ug | INTRAMUSCULAR | Status: DC | PRN

## 2018-09-22 MED ORDER — METOCLOPRAMIDE HCL 10 MG PO TABS: 5.0000 mg | ORAL_TABLET | Freq: Three times a day (TID) | ORAL | Status: DC | PRN

## 2018-09-22 MED ORDER — LIDOCAINE HCL (PF) 2 % IJ SOLN
INTRAMUSCULAR | Status: DC | PRN
  Administered 2018-09-22: 50 mg

## 2018-09-22 MED ORDER — ONDANSETRON HCL 4 MG/2ML IJ SOLN: 4.0000 mg | Freq: Four times a day (QID) | INTRAMUSCULAR | Status: DC | PRN

## 2018-09-22 MED ORDER — MIDAZOLAM HCL 2 MG/2ML IJ SOLN
INTRAMUSCULAR | Status: AC
  Filled 2018-09-22: qty 2

## 2018-09-22 MED ORDER — LACTATED RINGERS IV SOLN
INTRAVENOUS | Status: DC
  Administered 2018-09-22: 14:00:00 via INTRAVENOUS

## 2018-09-22 MED ORDER — DEXAMETHASONE SODIUM PHOSPHATE 10 MG/ML IJ SOLN
INTRAMUSCULAR | Status: AC
  Filled 2018-09-22: qty 1

## 2018-09-22 MED ORDER — HYDROCODONE-ACETAMINOPHEN 5-325 MG PO TABS: 1.0000 | ORAL_TABLET | ORAL | Status: DC | PRN

## 2018-09-22 MED ORDER — CHLORHEXIDINE GLUCONATE 4 % EX LIQD: 60.0000 mL | Freq: Once | CUTANEOUS | Status: DC

## 2018-09-22 MED ORDER — METOCLOPRAMIDE HCL 5 MG/ML IJ SOLN: 5.0000 mg | Freq: Three times a day (TID) | INTRAMUSCULAR | Status: DC | PRN

## 2018-09-22 MED ORDER — ONDANSETRON HCL 4 MG/2ML IJ SOLN
INTRAMUSCULAR | Status: DC | PRN
  Administered 2018-09-22: 4 mg via INTRAVENOUS

## 2018-09-22 MED ORDER — GLYCOPYRROLATE 0.2 MG/ML IJ SOLN
INTRAMUSCULAR | Status: AC
  Filled 2018-09-22: qty 1

## 2018-09-22 MED ORDER — PROPOFOL 10 MG/ML IV BOLUS
INTRAVENOUS | Status: AC
  Filled 2018-09-22: qty 20

## 2018-09-22 MED ORDER — LIDOCAINE HCL (PF) 2 % IJ SOLN
INTRAMUSCULAR | Status: AC
  Filled 2018-09-22: qty 10

## 2018-09-22 MED ORDER — DEXAMETHASONE SODIUM PHOSPHATE 10 MG/ML IJ SOLN
INTRAMUSCULAR | Status: DC | PRN
  Administered 2018-09-22: 5 mg via INTRAVENOUS

## 2018-09-22 MED ORDER — ONDANSETRON HCL 4 MG/2ML IJ SOLN
INTRAMUSCULAR | Status: AC
  Filled 2018-09-22: qty 2

## 2018-09-22 SURGICAL SUPPLY — 28 items
BANDAGE ELASTIC 3 LF NS (GAUZE/BANDAGES/DRESSINGS) ×3 IMPLANT
BNDG CMPR MED 5X3 ELC HKLP NS (GAUZE/BANDAGES/DRESSINGS) ×1
BNDG ESMARK 4X12 TAN STRL LF (GAUZE/BANDAGES/DRESSINGS) ×3 IMPLANT
CANISTER SUCT 1200ML W/VALVE (MISCELLANEOUS) ×3 IMPLANT
CAST PADDING 3X4FT ST 30246 (SOFTGOODS) ×2
COVER WAND RF STERILE (DRAPES) ×3 IMPLANT
CUFF TOURN 18 STER (MISCELLANEOUS) ×3 IMPLANT
DRSG DERMACEA 8X12 NADH (GAUZE/BANDAGES/DRESSINGS) ×3 IMPLANT
DURAPREP 26ML APPLICATOR (WOUND CARE) ×3 IMPLANT
ELECT CAUTERY BLADE 6.4 (BLADE) ×3 IMPLANT
ELECT REM PT RETURN 9FT ADLT (ELECTROSURGICAL) ×3
ELECTRODE REM PT RTRN 9FT ADLT (ELECTROSURGICAL) ×1 IMPLANT
GAUZE SPONGE 4X4 12PLY STRL (GAUZE/BANDAGES/DRESSINGS) ×3 IMPLANT
GLOVE BIOGEL M STRL SZ7.5 (GLOVE) ×3 IMPLANT
GLOVE INDICATOR 8.0 STRL GRN (GLOVE) ×3 IMPLANT
GOWN STRL REUS W/ TWL LRG LVL3 (GOWN DISPOSABLE) ×2 IMPLANT
GOWN STRL REUS W/TWL LRG LVL3 (GOWN DISPOSABLE) ×6
KIT TURNOVER KIT A (KITS) ×3 IMPLANT
NS IRRIG 500ML POUR BTL (IV SOLUTION) ×3 IMPLANT
PACK EXTREMITY ARMC (MISCELLANEOUS) ×3 IMPLANT
PAD CAST CTTN 3X4 STRL (SOFTGOODS) ×1 IMPLANT
PADDING CAST COTTON 3X4 STRL (SOFTGOODS) ×1
SOL PREP PVP 2OZ (MISCELLANEOUS) ×3
SOLUTION PREP PVP 2OZ (MISCELLANEOUS) ×1 IMPLANT
SPLINT CAST 1 STEP 3X12 (MISCELLANEOUS) ×3 IMPLANT
STOCKINETTE 48X4 2 PLY STRL (GAUZE/BANDAGES/DRESSINGS) ×1 IMPLANT
STOCKINETTE STRL 4IN 9604848 (GAUZE/BANDAGES/DRESSINGS) ×3 IMPLANT
SUT ETHILON 5-0 FS-2 18 BLK (SUTURE) ×3 IMPLANT

## 2018-09-22 NOTE — Anesthesia Procedure Notes (Signed)
Procedure Name: LMA Insertion Performed by: Aki Burdin, CRNA Pre-anesthesia Checklist: Patient identified, Patient being monitored, Timeout performed, Emergency Drugs available and Suction available Patient Re-evaluated:Patient Re-evaluated prior to induction Oxygen Delivery Method: Circle system utilized Preoxygenation: Pre-oxygenation with 100% oxygen Induction Type: IV induction Ventilation: Mask ventilation without difficulty LMA: LMA inserted LMA Size: 4.5 Tube type: Oral Number of attempts: 1 Placement Confirmation: positive ETCO2 and breath sounds checked- equal and bilateral Tube secured with: Tape Dental Injury: Teeth and Oropharynx as per pre-operative assessment        

## 2018-09-22 NOTE — Anesthesia Post-op Follow-up Note (Signed)
Anesthesia QCDR form completed.        

## 2018-09-22 NOTE — Anesthesia Preprocedure Evaluation (Signed)
Anesthesia Evaluation  Patient identified by MRN, date of birth, ID band Patient awake    Reviewed: Allergy & Precautions, H&P , NPO status , Patient's Chart, lab work & pertinent test results, reviewed documented beta blocker date and time   Airway Mallampati: III  TM Distance: >3 FB Neck ROM: full    Dental  (+) Teeth Intact   Pulmonary neg pulmonary ROS, former smoker,    Pulmonary exam normal        Cardiovascular Exercise Tolerance: Good hypertension, On Medications + CAD, + Past MI and + Peripheral Vascular Disease  Normal cardiovascular exam Rate:Normal     Neuro/Psych  Neuromuscular disease negative psych ROS   GI/Hepatic negative GI ROS, Neg liver ROS, GERD  Medicated,  Endo/Other  negative endocrine ROS  Renal/GU negative Renal ROS  negative genitourinary   Musculoskeletal   Abdominal   Peds  Hematology negative hematology ROS (+)   Anesthesia Other Findings   Reproductive/Obstetrics negative OB ROS                             Anesthesia Physical Anesthesia Plan  ASA: III  Anesthesia Plan: General LMA   Post-op Pain Management:    Induction:   PONV Risk Score and Plan:   Airway Management Planned:   Additional Equipment:   Intra-op Plan:   Post-operative Plan:   Informed Consent: I have reviewed the patients History and Physical, chart, labs and discussed the procedure including the risks, benefits and alternatives for the proposed anesthesia with the patient or authorized representative who has indicated his/her understanding and acceptance.     Plan Discussed with: CRNA  Anesthesia Plan Comments:         Anesthesia Quick Evaluation

## 2018-09-22 NOTE — Discharge Instructions (Addendum)
°  Instructions after Hand / Wrist Surgery ° ° Clee P. Hooten, Jr., M.D. ° Dept. of Orthopaedics & Sports Medicine ° [facility] ° [address] ° [place], [place]  [address] ° ° Phone: [phone]   Fax: [phone] ° ° °DIET: °• Drink plenty of non-alcoholic fluids & begin a light diet. °• Resume your normal diet the day after surgery. ° °ACTIVITY:  °• Keep the hand elevated above the level of the elbow. °• Begin gently moving the fingers on a regular basis to avoid stiffness. °• Avoid any heavy lifting, pushing, or pulling with the operative hand. °• Do not drive or operate any equipment until instructed. ° °WOUND CARE:  °• Keep the splint/bandage clean and dry.  °• The splint and stitches will be removed in the office. °• Continue to use the ice packs periodically to reduce pain and swelling. °• You may bathe or shower after the stitches are removed at the first office visit following surgery. ° °MEDICATIONS: °• You may resume your regular medications. °• Please take the pain medication as prescribed. °• Do not take pain medication on an empty stomach. °• Do not drive or drink alcoholic beverages when taking pain medications. ° °CALL THE OFFICE FOR: °• Temperature above 101 degrees °• Excessive bleeding or drainage on the dressing. °• Excessive swelling, coldness, or paleness of the fingers. °• Persistent nausea and vomiting. ° °FOLLOW-UP:  °• You should have an appointment to return to the office in 7-10 days after surgery.  ° °REMEMBER: R.I.C.E. = Rest, Ice, Compression, Elevation !  ° ° ° °AMBULATORY SURGERY  °DISCHARGE INSTRUCTIONS ° ° °1) The drugs that you were given will stay in your system until tomorrow so for the next 24 hours you should not: ° °A) Drive an automobile °B) Make any legal decisions °C) Drink any alcoholic beverage ° ° °2) You may resume regular meals tomorrow.  Today it is better to start with liquids and gradually work up to solid foods. ° °You may eat anything you prefer, but  it is better to start with liquids, then soup and crackers, and gradually work up to solid foods. ° ° °3) Please notify your doctor immediately if you have any unusual bleeding, trouble breathing, redness and pain at the surgery site, drainage, fever, or pain not relieved by medication. ° ° ° °4) Additional Instructions: ° ° ° ° ° ° ° °Please contact your physician with any problems or Same Day Surgery at [phone], Monday through Friday 6 am to 4 pm, or [place] [facility] [place] Main number at [phone]. °

## 2018-09-22 NOTE — Op Note (Signed)
OPERATIVE NOTE  DATE OF SURGERY:  09/22/2018  PATIENT NAME:  Patrick Barrera   DOB: 01/01/1956  MRN: [id number]  PRE-OPERATIVE DIAGNOSIS: Left carpal tunnel syndrome  POST-OPERATIVE DIAGNOSIS:  Same  PROCEDURE:  Left carpal tunnel release  SURGEON:  Marciano Sequin. M.D.  ANESTHESIA: general  ESTIMATED BLOOD LOSS: Minimal  FLUIDS REPLACED: 500 mL of crystalloid  TOURNIQUET TIME: 30 minutes  DRAINS: None  INDICATIONS FOR SURGERY: [name] is a 62 y.o. year old male with a long history of numbness and paresthesias to the left hand. EMG/nerve conduction studies demonstrated findings consistent with carpal tunnel syndrome.The patient had not seen any significant improvement despite conservative nonsurgical intervention. After discussion of the risks and benefits of surgical intervention, the patient expressed understanding of the risks benefits and agree with plans for carpal tunnel release.   PROCEDURE IN DETAIL: The patient was brought into the operating room and after adequate general anesthesia, a tourniquet was placed on the patient's left upper arm.The left hand and arm were prepped with alcohol and Duraprep and draped in the usual sterile fashion. A "time-out" was performed as per usual protocol. The hand and forearm were exsanguinated using an Esmarch and the tourniquet was inflated to 250 mmHg. Loupe magnification was used throughout the procedure. An incision was made just ulnar to the thenar palmar crease. Dissection was carried down through the palmar fascia to the transverse carpal ligament. The transverse carpal ligament was sharply incised, taking care to protect the underlying structures with the carpal tunnel. Complete release of the transverse carpal ligament was achieved. There was no evidence of ganglion cyst or lipoma within the carpal tunnel. The wound was irrigated with copious amounts of normal saline with antibiotic solution. The skin was then  re-approximated with interrupted sutures of #5-0 nylon. A sterile dressing was applied followed by application of a volar splint. The tourniquet was deflated with a total tourniquet time of 30 minutes.  The patient tolerated the procedure well and was transported to the PACU in stable condition.  Nikolaj P. Holley Bouche., M.D.

## 2018-09-22 NOTE — H&P (Signed)
The patient has been re-examined, and the chart reviewed, and there have been no interval changes to the documented history and physical.    The risks, benefits, and alternatives have been discussed at length. The patient expressed understanding of the risks benefits and agreed with plans for surgical intervention.  Rodman P. Topaz Raglin, Jr. M.D.    

## 2018-09-22 NOTE — Transfer of Care (Signed)
Immediate Anesthesia Transfer of Care Note  Patient: Patrick Barrera  Procedure(s) Performed: CARPAL TUNNEL RELEASE (Left )  Patient Location: PACU  Anesthesia Type:General  Level of Consciousness: sedated  Airway & Oxygen Therapy: Patient Spontanous Breathing and Patient connected to face mask oxygen  Post-op Assessment: Report given to RN and Post -op Vital signs reviewed and stable  Post vital signs: Reviewed  Last Vitals:  Vitals Value Taken Time  BP 118/68 09/22/2018  3:58 PM  Temp    Pulse 51 09/22/2018  3:58 PM  Resp 11 09/22/2018  3:58 PM  SpO2 100 % 09/22/2018  3:58 PM    Last Pain:  Vitals:   09/22/18 1329  TempSrc: Tympanic  PainSc: 0-No pain         Complications: No apparent anesthesia complications

## 2018-09-23 ENCOUNTER — Encounter: Payer: Self-pay | Admitting: Orthopedic Surgery

## 2018-09-24 NOTE — Anesthesia Postprocedure Evaluation (Signed)
Anesthesia Post Note  Patient: Patrick Barrera  Procedure(s) Performed: CARPAL TUNNEL RELEASE (Left )  Patient location during evaluation: PACU Anesthesia Type: General Level of consciousness: awake and alert Pain management: pain level controlled Vital Signs Assessment: post-procedure vital signs reviewed and stable Respiratory status: spontaneous breathing, nonlabored ventilation, respiratory function stable and patient connected to nasal cannula oxygen Cardiovascular status: blood pressure returned to baseline and stable Postop Assessment: no apparent nausea or vomiting Anesthetic complications: no     Last Vitals:  Vitals:   09/22/18 1628 09/22/18 1637  BP: 115/62 122/76  Pulse: 60 (!) 52  Resp: 12 16  Temp: (!) 36.2 C (!) 36.2 C  SpO2: 97% 98%    Last Pain:  Vitals:   09/23/18 0818  TempSrc:   PainSc: 0-No pain                 Molli Barrows

## 2018-10-16 ENCOUNTER — Other Ambulatory Visit: Payer: Self-pay | Admitting: Cardiovascular Disease

## 2018-11-09 DIAGNOSIS — Z9889 Other specified postprocedural states: Secondary | ICD-10-CM | POA: Insufficient documentation

## 2018-12-07 DIAGNOSIS — L600 Ingrowing nail: Secondary | ICD-10-CM | POA: Diagnosis not present

## 2018-12-07 DIAGNOSIS — L03031 Cellulitis of right toe: Secondary | ICD-10-CM | POA: Diagnosis not present

## 2019-02-24 DIAGNOSIS — M25561 Pain in right knee: Secondary | ICD-10-CM | POA: Diagnosis not present

## 2019-02-24 DIAGNOSIS — M2391 Unspecified internal derangement of right knee: Secondary | ICD-10-CM | POA: Diagnosis not present

## 2019-02-25 ENCOUNTER — Other Ambulatory Visit: Payer: Self-pay | Admitting: Physician Assistant

## 2019-02-25 DIAGNOSIS — M25561 Pain in right knee: Secondary | ICD-10-CM

## 2019-03-05 ENCOUNTER — Ambulatory Visit
Admission: RE | Admit: 2019-03-05 | Discharge: 2019-03-05 | Disposition: A | Discharge status: Home / Self Care | Payer: BLUE CROSS/BLUE SHIELD | Source: Ambulatory Visit | Attending: Physician Assistant | Admitting: Physician Assistant

## 2019-03-05 ENCOUNTER — Other Ambulatory Visit: Payer: Self-pay

## 2019-03-05 DIAGNOSIS — M25561 Pain in right knee: Secondary | ICD-10-CM | POA: Diagnosis not present

## 2019-03-20 DIAGNOSIS — S83231A Complex tear of medial meniscus, current injury, right knee, initial encounter: Secondary | ICD-10-CM | POA: Diagnosis not present

## 2019-03-20 DIAGNOSIS — M1711 Unilateral primary osteoarthritis, right knee: Secondary | ICD-10-CM | POA: Diagnosis not present

## 2019-03-20 DIAGNOSIS — S83271A Complex tear of lateral meniscus, current injury, right knee, initial encounter: Secondary | ICD-10-CM

## 2019-03-20 HISTORY — DX: Complex tear of lateral meniscus, current injury, right knee, initial encounter: S83.271A

## 2019-03-20 HISTORY — DX: Complex tear of medial meniscus, current injury, right knee, initial encounter: S83.231A

## 2019-04-02 DIAGNOSIS — M2391 Unspecified internal derangement of right knee: Secondary | ICD-10-CM | POA: Diagnosis not present

## 2019-05-04 ENCOUNTER — Other Ambulatory Visit: Payer: Self-pay

## 2019-05-04 ENCOUNTER — Telehealth: Payer: Self-pay | Admitting: Cardiovascular Disease

## 2019-05-04 ENCOUNTER — Encounter
Admission: RE | Admit: 2019-05-04 | Discharge: 2019-05-04 | Disposition: A | Discharge status: Home / Self Care | Payer: BC Managed Care – PPO | Source: Ambulatory Visit | Attending: Orthopedic Surgery | Admitting: Orthopedic Surgery

## 2019-05-04 DIAGNOSIS — I2510 Atherosclerotic heart disease of native coronary artery without angina pectoris: Secondary | ICD-10-CM | POA: Diagnosis not present

## 2019-05-04 DIAGNOSIS — Z87891 Personal history of nicotine dependence: Secondary | ICD-10-CM | POA: Insufficient documentation

## 2019-05-04 DIAGNOSIS — I252 Old myocardial infarction: Secondary | ICD-10-CM | POA: Diagnosis not present

## 2019-05-04 DIAGNOSIS — I10 Essential (primary) hypertension: Secondary | ICD-10-CM | POA: Diagnosis not present

## 2019-05-04 DIAGNOSIS — Z01818 Encounter for other preprocedural examination: Secondary | ICD-10-CM | POA: Diagnosis not present

## 2019-05-04 LAB — CBC
HCT: 42.7 % (ref 39.0–52.0)
Hemoglobin: 14.1 g/dL (ref 13.0–17.0)
MCH: 32.3 pg (ref 26.0–34.0)
MCHC: 33 g/dL (ref 30.0–36.0)
MCV: 97.7 fL (ref 80.0–100.0)
Platelets: 233 10*3/uL (ref 150–400)
RBC: 4.37 MIL/uL (ref 4.22–5.81)
RDW: 12.1 % (ref 11.5–15.5)
WBC: 5.2 10*3/uL (ref 4.0–10.5)
nRBC: 0 % (ref 0.0–0.2)

## 2019-05-04 LAB — BASIC METABOLIC PANEL
Anion gap: 7 (ref 5–15)
BUN: 14 mg/dL (ref 8–23)
CO2: 23 mmol/L (ref 22–32)
Calcium: 8.9 mg/dL (ref 8.9–10.3)
Chloride: 108 mmol/L (ref 98–111)
Creatinine, Ser: 0.98 mg/dL (ref 0.61–1.24)
GFR calc Af Amer: >60 mL/min (ref >60)
GFR calc non Af Amer: >60 mL/min (ref >60)
Glucose, Bld: 146 mg/dL — ABNORMAL HIGH (ref 70–99)
Potassium: 3.8 mmol/L (ref 3.5–5.1)
Sodium: 138 mmol/L (ref 135–145)

## 2019-05-04 NOTE — Telephone Encounter (Signed)
Pt called back per operator. I relayed patient instructions, he verbalized understanding and gratitude.

## 2019-05-04 NOTE — Telephone Encounter (Signed)
   [facility] Pre-operative Risk Assessment    Request for surgical clearance:  1. What type of surgery is being performed? Right knee arthroscopy  2. When is this surgery scheduled? 05/11/2019  3. What type of clearance is required (medical clearance vs. Pharmacy clearance to hold med vs. Both)? both  4. Are there any medications that need to be held prior to surgery and how long? plavix   5. Practice name and name of physician performing surgery? Dr. Marry Guan  -pre admit testing requesting  6. What is your office phone number [phone] IT PT PHONE -pre admit requests we contact patient with plavix instructions pre admit contact number is [phone]   7.   What is your office fax number [phone]  8.   Anesthesia type (None, local, MAC, general) ? Not listed   Lucienne Minks 05/04/2019, 3:36 PM  _________________________________________________________________   (provider comments below)

## 2019-05-04 NOTE — Telephone Encounter (Signed)
Call placed to pt re: recommendations below for surgical clearance.  Left pt a message to call back.   Placed cal to preadmit to make them aware of the recommendations as well, left a detailed message.

## 2019-05-04 NOTE — Patient Instructions (Signed)
  Your procedure is scheduled on: Monday May 11, 2019 Report to Same Day Surgery 2nd floor [facility] Entrance-take elevator on left to 2nd floor.  Check in with surgery information desk.) To find out your arrival time, call [phone] 1:00-3:00 PM on Friday May 08, 2019  Remember: Instructions that are not followed completely may result in serious medical risk, up to and including death, or upon the discretion of your surgeon and anesthesiologist your surgery may need to be rescheduled.    __x__ 1. Do not eat food (including mints, candies, chewing gum) after midnight the night before your procedure. You may drink clear liquids up to 2 hours before you are scheduled to arrive at the hospital for your procedure.  Do not drink anything within 2 hours of your scheduled arrival to the hospital.  Approved clear liquids:  --Water or Apple juice without pulp  --Clear carbohydrate beverage such as Gatorade or Powerade  --Black Coffee or Clear Tea (No milk, no creamers, do not add anything to the coffee or tea)  Finish your provided Ensure drink 2 hours before arriving to hospital.    __x__ 2. No Alcohol for 24 hours before or after surgery.   __x__ 3. No Smoking or e-cigarettes for 24 hours before surgery.  Do not use any chewable tobacco products for at least 6 hours before surgery.   __x__ 4. Notify your doctor if there is any change in your medical condition (cold, fever, infections).   __x__ 5. On the morning of surgery brush your teeth with toothpaste and water.  You may rinse your mouth with mouthwash if you wish.  Do not swallow any toothpaste or mouthwash.  Please read over the following fact sheets that you were given:   [facility] Preparing for Surgery and/or MRSA Information    __x__ Use CHG Soap as directed on instruction sheet.   Do not wear jewelry on the day of surgery.  Do not wear lotions, powders, deodorant, or perfumes.   Do not shave below the  face/neck 48 hours prior to surgery.   Do not bring valuables to the hospital.    [facility] is not responsible for any belongings or valuables.               Contacts, dentures or bridgework may not be worn into surgery.  For patients admitted to the hospital, discharge time is determined by your treatment team.  For patients discharged on the day of surgery, you will NOT be permitted to drive yourself home.  You must have a responsible adult with you for 24 hours after surgery.  __x__ Take these medicines on the morning of surgery:  1. NONE  __x__ Follow recommendations from Cardiologist regarding stopping blood thinners such as Plavix.  Guideline to stop 5-7 days before surgery.  __x__ TODAY: Do not take any Anti-inflammatories such as Advil, Ibuprofen, Motrin, Aleve, Naproxen, Naprosyn, BC/Goodies powders or aspirin products. You may continue to take Tylenol.   __x__ TODAY: Do not take any over the counter supplements until after surgery. You may continue to take Vitamin D, Vitamin B, and multivitamin.

## 2019-05-04 NOTE — Telephone Encounter (Addendum)
   Primary Cardiologist: Ida Rogue, MD  Chart reviewed as part of pre-operative protocol coverage. Patient was contacted 05/04/2019 in reference to pre-operative risk assessment for pending surgery as outlined below.  Anchor Dwan was last seen on 07/14/18 by Dr. Rockey Situ. He has history of CAD s/p PCI (last in 08/2006 with residual 70% mLAD not intervened upon), COPD, HLD, former tobacco abuse, Barrett's esophagus, maintained on long term ASA/Plavix .He has not required any recent PCI or ischemic testing. He is very active, taking care of family's yards and walking 5 miles a day at his job without any angina, dyspnea, or any other concerning cardiac symptoms. Therefore, based on ACC/AHA guidelines, the patient would be at acceptable risk for the planned procedure without further cardiovascular testing.   Pre-admissions testing document indicates a request to hold Plavix for 5-7 days beforehand, with our office to decide on timing. The patient is also supposed to be on a baby aspirin but is not presently taking this. I discussed with Dr. Rockey Situ. He recommends that patient can hold Plavix for 5 days (last dose 05/05/19), but recommends that the patient remain on low dose aspirin pre-operatively. Will route to callback MA to call patient to let him know about restarting ASA, and no Plavix after 05/05/19. Will also ask callback MA to call pre-admission testing to make them aware Dr. Rockey Situ recommended he remain on a low dose aspirin. We typically advise that blood thinners be resumed when felt safe by performing physician.  Will route this bundled recommendation to requesting provider via Epic fax function. Please call with questions.  Charlie Pitter, PA-C 05/04/2019, 3:59 PM

## 2019-05-06 ENCOUNTER — Other Ambulatory Visit: Payer: Self-pay | Admitting: Physician Assistant

## 2019-05-06 MED ORDER — ASPIRIN EC 81 MG PO TBEC: 81.0000 mg | DELAYED_RELEASE_TABLET | Freq: Every day | ORAL | Status: AC

## 2019-05-07 ENCOUNTER — Other Ambulatory Visit
Admission: RE | Admit: 2019-05-07 | Discharge: 2019-05-07 | Disposition: A | Discharge status: Home / Self Care | Payer: BC Managed Care – PPO | Source: Ambulatory Visit | Attending: Orthopedic Surgery | Admitting: Orthopedic Surgery

## 2019-05-07 ENCOUNTER — Other Ambulatory Visit: Payer: Self-pay

## 2019-05-07 DIAGNOSIS — Z20828 Contact with and (suspected) exposure to other viral communicable diseases: Secondary | ICD-10-CM | POA: Insufficient documentation

## 2019-05-07 LAB — SARS CORONAVIRUS 2 (TAT 6-24 HRS): SARS Coronavirus 2: NEGATIVE

## 2019-05-11 ENCOUNTER — Ambulatory Visit: Payer: BC Managed Care – PPO | Admitting: Certified Registered Nurse Anesthetist

## 2019-05-11 ENCOUNTER — Encounter: Payer: Self-pay | Admitting: Orthopedic Surgery

## 2019-05-11 ENCOUNTER — Other Ambulatory Visit: Payer: Self-pay

## 2019-05-11 ENCOUNTER — Ambulatory Visit
Admission: RE | Admit: 2019-05-11 | Discharge: 2019-05-11 | Disposition: A | Discharge status: Home / Self Care | Payer: BC Managed Care – PPO | Attending: Orthopedic Surgery | Admitting: Orthopedic Surgery

## 2019-05-11 ENCOUNTER — Encounter
Admission: RE | Disposition: A | Discharge status: Home / Self Care | Payer: Self-pay | Source: Home / Self Care | Attending: Orthopedic Surgery

## 2019-05-11 DIAGNOSIS — I2510 Atherosclerotic heart disease of native coronary artery without angina pectoris: Secondary | ICD-10-CM | POA: Diagnosis not present

## 2019-05-11 DIAGNOSIS — K219 Gastro-esophageal reflux disease without esophagitis: Secondary | ICD-10-CM | POA: Insufficient documentation

## 2019-05-11 DIAGNOSIS — M23241 Derangement of anterior horn of lateral meniscus due to old tear or injury, right knee: Secondary | ICD-10-CM | POA: Diagnosis not present

## 2019-05-11 DIAGNOSIS — Z955 Presence of coronary angioplasty implant and graft: Secondary | ICD-10-CM | POA: Insufficient documentation

## 2019-05-11 DIAGNOSIS — M23221 Derangement of posterior horn of medial meniscus due to old tear or injury, right knee: Secondary | ICD-10-CM | POA: Diagnosis not present

## 2019-05-11 DIAGNOSIS — S83241A Other tear of medial meniscus, current injury, right knee, initial encounter: Secondary | ICD-10-CM | POA: Insufficient documentation

## 2019-05-11 DIAGNOSIS — S83281A Other tear of lateral meniscus, current injury, right knee, initial encounter: Secondary | ICD-10-CM | POA: Diagnosis not present

## 2019-05-11 DIAGNOSIS — Z885 Allergy status to narcotic agent status: Secondary | ICD-10-CM | POA: Diagnosis not present

## 2019-05-11 DIAGNOSIS — M2391 Unspecified internal derangement of right knee: Secondary | ICD-10-CM | POA: Diagnosis present

## 2019-05-11 DIAGNOSIS — M94261 Chondromalacia, right knee: Secondary | ICD-10-CM | POA: Diagnosis not present

## 2019-05-11 DIAGNOSIS — Z79899 Other long term (current) drug therapy: Secondary | ICD-10-CM | POA: Diagnosis not present

## 2019-05-11 DIAGNOSIS — I252 Old myocardial infarction: Secondary | ICD-10-CM | POA: Insufficient documentation

## 2019-05-11 DIAGNOSIS — Z888 Allergy status to other drugs, medicaments and biological substances status: Secondary | ICD-10-CM | POA: Insufficient documentation

## 2019-05-11 DIAGNOSIS — Z7982 Long term (current) use of aspirin: Secondary | ICD-10-CM | POA: Diagnosis not present

## 2019-05-11 DIAGNOSIS — Z87891 Personal history of nicotine dependence: Secondary | ICD-10-CM | POA: Diagnosis not present

## 2019-05-11 DIAGNOSIS — X58XXXA Exposure to other specified factors, initial encounter: Secondary | ICD-10-CM | POA: Diagnosis not present

## 2019-05-11 DIAGNOSIS — Z7902 Long term (current) use of antithrombotics/antiplatelets: Secondary | ICD-10-CM | POA: Insufficient documentation

## 2019-05-11 DIAGNOSIS — I10 Essential (primary) hypertension: Secondary | ICD-10-CM | POA: Insufficient documentation

## 2019-05-11 DIAGNOSIS — I700 Atherosclerosis of aorta: Secondary | ICD-10-CM | POA: Diagnosis not present

## 2019-05-11 DIAGNOSIS — E785 Hyperlipidemia, unspecified: Secondary | ICD-10-CM | POA: Diagnosis not present

## 2019-05-11 DIAGNOSIS — M23251 Derangement of posterior horn of lateral meniscus due to old tear or injury, right knee: Secondary | ICD-10-CM | POA: Diagnosis not present

## 2019-05-11 DIAGNOSIS — Z9889 Other specified postprocedural states: Secondary | ICD-10-CM

## 2019-05-11 HISTORY — PX: CHONDROPLASTY: SHX5177

## 2019-05-11 HISTORY — PX: KNEE ARTHROSCOPY: SHX127

## 2019-05-11 SURGERY — ARTHROSCOPY, KNEE
Anesthesia: General | Site: Knee | Laterality: Right

## 2019-05-11 MED ORDER — ACETAMINOPHEN 10 MG/ML IV SOLN
INTRAVENOUS | Status: AC
  Filled 2019-05-11: qty 100

## 2019-05-11 MED ORDER — PHENYLEPHRINE HCL (PRESSORS) 10 MG/ML IV SOLN
INTRAVENOUS | Status: AC
  Filled 2019-05-11: qty 1

## 2019-05-11 MED ORDER — MEPERIDINE HCL 50 MG/ML IJ SOLN: 6.2500 mg | INTRAMUSCULAR | Status: DC | PRN

## 2019-05-11 MED ORDER — OXYCODONE HCL 5 MG PO TABS
ORAL_TABLET | ORAL | Status: AC
  Filled 2019-05-11: qty 1

## 2019-05-11 MED ORDER — CHLORHEXIDINE GLUCONATE 4 % EX LIQD: 60.0000 mL | Freq: Once | CUTANEOUS | Status: DC

## 2019-05-11 MED ORDER — OXYCODONE HCL 5 MG PO TABS
5.0000 mg | ORAL_TABLET | Freq: Once | ORAL | Status: AC | PRN
  Administered 2019-05-11: 5 mg via ORAL

## 2019-05-11 MED ORDER — MIDAZOLAM HCL 2 MG/2ML IJ SOLN
INTRAMUSCULAR | Status: AC
  Filled 2019-05-11: qty 2

## 2019-05-11 MED ORDER — FENTANYL CITRATE (PF) 100 MCG/2ML IJ SOLN
25.0000 ug | INTRAMUSCULAR | Status: DC | PRN
  Administered 2019-05-11 (×2): 25 ug via INTRAVENOUS

## 2019-05-11 MED ORDER — DEXAMETHASONE SODIUM PHOSPHATE 10 MG/ML IJ SOLN
INTRAMUSCULAR | Status: DC | PRN
  Administered 2019-05-11: 5 mg via INTRAVENOUS

## 2019-05-11 MED ORDER — METOCLOPRAMIDE HCL 5 MG/ML IJ SOLN: 5.0000 mg | Freq: Three times a day (TID) | INTRAMUSCULAR | Status: DC | PRN

## 2019-05-11 MED ORDER — HYDROCODONE-ACETAMINOPHEN 5-325 MG PO TABS: 1.0000 | ORAL_TABLET | ORAL | 0 refills | Status: DC | PRN

## 2019-05-11 MED ORDER — METOCLOPRAMIDE HCL 10 MG PO TABS: 5.0000 mg | ORAL_TABLET | Freq: Three times a day (TID) | ORAL | Status: DC | PRN

## 2019-05-11 MED ORDER — FENTANYL CITRATE (PF) 100 MCG/2ML IJ SOLN
INTRAMUSCULAR | Status: AC
  Administered 2019-05-11: 25 ug via INTRAVENOUS
  Filled 2019-05-11: qty 2

## 2019-05-11 MED ORDER — PHENYLEPHRINE HCL (PRESSORS) 10 MG/ML IV SOLN
INTRAVENOUS | Status: DC | PRN
  Administered 2019-05-11 (×4): 100 ug via INTRAVENOUS
  Administered 2019-05-11: 50 ug via INTRAVENOUS

## 2019-05-11 MED ORDER — MORPHINE SULFATE 4 MG/ML IJ SOLN
INTRAMUSCULAR | Status: DC | PRN
  Administered 2019-05-11: 4 mg via INTRAMUSCULAR

## 2019-05-11 MED ORDER — SODIUM CHLORIDE 0.9 % IV SOLN: INTRAVENOUS | Status: DC

## 2019-05-11 MED ORDER — FENTANYL CITRATE (PF) 100 MCG/2ML IJ SOLN
INTRAMUSCULAR | Status: AC
  Filled 2019-05-11: qty 2

## 2019-05-11 MED ORDER — PROMETHAZINE HCL 25 MG/ML IJ SOLN: 6.2500 mg | INTRAMUSCULAR | Status: DC | PRN

## 2019-05-11 MED ORDER — BUPIVACAINE-EPINEPHRINE 0.25% -1:200000 IJ SOLN
INTRAMUSCULAR | Status: DC | PRN
  Administered 2019-05-11: 25 mL
  Administered 2019-05-11: 5 mL

## 2019-05-11 MED ORDER — PROPOFOL 10 MG/ML IV BOLUS
INTRAVENOUS | Status: AC
  Filled 2019-05-11: qty 20

## 2019-05-11 MED ORDER — MIDAZOLAM HCL 5 MG/5ML IJ SOLN
INTRAMUSCULAR | Status: DC | PRN
  Administered 2019-05-11: 2 mg via INTRAVENOUS

## 2019-05-11 MED ORDER — LACTATED RINGERS IV SOLN
INTRAVENOUS | Status: DC
  Administered 2019-05-11 (×2): via INTRAVENOUS

## 2019-05-11 MED ORDER — LIDOCAINE HCL (PF) 2 % IJ SOLN
INTRAMUSCULAR | Status: AC
  Filled 2019-05-11: qty 10

## 2019-05-11 MED ORDER — BUPIVACAINE-EPINEPHRINE (PF) 0.25% -1:200000 IJ SOLN
INTRAMUSCULAR | Status: AC
  Filled 2019-05-11: qty 30

## 2019-05-11 MED ORDER — ONDANSETRON HCL 4 MG/2ML IJ SOLN
INTRAMUSCULAR | Status: AC
  Filled 2019-05-11: qty 2

## 2019-05-11 MED ORDER — LIDOCAINE HCL (CARDIAC) PF 100 MG/5ML IV SOSY
PREFILLED_SYRINGE | INTRAVENOUS | Status: DC | PRN
  Administered 2019-05-11: 60 mg via INTRAVENOUS

## 2019-05-11 MED ORDER — ONDANSETRON HCL 4 MG PO TABS: 4.0000 mg | ORAL_TABLET | Freq: Four times a day (QID) | ORAL | Status: DC | PRN

## 2019-05-11 MED ORDER — ONDANSETRON HCL 4 MG/2ML IJ SOLN: 4.0000 mg | Freq: Four times a day (QID) | INTRAMUSCULAR | Status: DC | PRN

## 2019-05-11 MED ORDER — DEXAMETHASONE SODIUM PHOSPHATE 10 MG/ML IJ SOLN
INTRAMUSCULAR | Status: AC
  Filled 2019-05-11: qty 1

## 2019-05-11 MED ORDER — ACETAMINOPHEN 10 MG/ML IV SOLN
INTRAVENOUS | Status: DC | PRN
  Administered 2019-05-11: 1000 mg via INTRAVENOUS

## 2019-05-11 MED ORDER — FENTANYL CITRATE (PF) 100 MCG/2ML IJ SOLN
INTRAMUSCULAR | Status: DC | PRN
  Administered 2019-05-11 (×3): 25 ug via INTRAVENOUS

## 2019-05-11 MED ORDER — MORPHINE SULFATE (PF) 4 MG/ML IV SOLN
INTRAVENOUS | Status: AC
  Filled 2019-05-11: qty 1

## 2019-05-11 MED ORDER — PROPOFOL 10 MG/ML IV BOLUS
INTRAVENOUS | Status: DC | PRN
  Administered 2019-05-11: 180 mg via INTRAVENOUS

## 2019-05-11 MED ORDER — OXYCODONE HCL 5 MG/5ML PO SOLN: 5.0000 mg | Freq: Once | ORAL | Status: AC | PRN

## 2019-05-11 SURGICAL SUPPLY — 25 items
BLADE SHAVER 4.5 DBL SERAT CV (CUTTER) IMPLANT
COVER WAND RF STERILE (DRAPES) ×2 IMPLANT
CUFF TOURN SGL QUICK 24 (TOURNIQUET CUFF) ×2
CUFF TOURN SGL QUICK 30 (TOURNIQUET CUFF)
CUFF TRNQT CYL 24X4X16.5-23 (TOURNIQUET CUFF) IMPLANT
CUFF TRNQT CYL 30X4X21-28X (TOURNIQUET CUFF) IMPLANT
DRSG DERMACEA 8X12 NADH (GAUZE/BANDAGES/DRESSINGS) ×2 IMPLANT
DURAPREP 26ML APPLICATOR (WOUND CARE) ×3 IMPLANT
GAUZE SPONGE 4X4 12PLY STRL (GAUZE/BANDAGES/DRESSINGS) ×2 IMPLANT
GLOVE BIOGEL M STRL SZ7.5 (GLOVE) ×3 IMPLANT
GLOVE INDICATOR 8.0 STRL GRN (GLOVE) ×3 IMPLANT
GOWN STRL REUS W/ TWL LRG LVL3 (GOWN DISPOSABLE) ×2 IMPLANT
GOWN STRL REUS W/TWL LRG LVL3 (GOWN DISPOSABLE) ×4
IV LACTATED RINGER IRRG 3000ML (IV SOLUTION) ×20
IV LR IRRIG 3000ML ARTHROMATIC (IV SOLUTION) ×6 IMPLANT
KIT TURNOVER KIT A (KITS) ×2 IMPLANT
MANIFOLD NEPTUNE II (INSTRUMENTS) ×2 IMPLANT
PACK ARTHROSCOPY KNEE (MISCELLANEOUS) ×2 IMPLANT
SET TUBE SUCT SHAVER OUTFL 24K (TUBING) ×2 IMPLANT
SET TUBE TIP INTRA-ARTICULAR (MISCELLANEOUS) ×2 IMPLANT
SUT ETHILON 3-0 FS-10 30 BLK (SUTURE) ×2
SUTURE EHLN 3-0 FS-10 30 BLK (SUTURE) ×1 IMPLANT
TUBING ARTHRO INFLOW-ONLY STRL (TUBING) ×2 IMPLANT
WAND HAND CNTRL MULTIVAC 50 (MISCELLANEOUS) ×3 IMPLANT
WRAP KNEE W/COLD PACKS 25.5X14 (SOFTGOODS) ×2 IMPLANT

## 2019-05-11 NOTE — Anesthesia Post-op Follow-up Note (Signed)
Anesthesia QCDR form completed.        

## 2019-05-11 NOTE — H&P (Signed)
The patient has been re-examined, and the chart reviewed, and there have been no interval changes to the documented history and physical.    The risks, benefits, and alternatives have been discussed at length. The patient expressed understanding of the risks benefits and agreed with plans for surgical intervention.  Piotr P. Joydan Gretzinger, Jr. M.D.    

## 2019-05-11 NOTE — Discharge Instructions (Signed)
AMBULATORY SURGERY  DISCHARGE INSTRUCTIONS   1) The drugs that you were given will stay in your system until tomorrow so for the next 24 hours you should not:  A) Drive an automobile B) Make any legal decisions C) Drink any alcoholic beverage   2) You may resume regular meals tomorrow.  Today it is better to start with liquids and gradually work up to solid foods.  You may eat anything you prefer, but it is better to start with liquids, then soup and crackers, and gradually work up to solid foods.   3) Please notify your doctor immediately if you have any unusual bleeding, trouble breathing, redness and pain at the surgery site, drainage, fever, or pain not relieved by medication.    4) Additional Instructions:        Please contact your physician with any problems or Same Day Surgery at [phone], Monday through Friday 6 am to 4 pm, or [place] at [facility] number at [phone].   Instructions after Knee Arthroscopy    Sharad P. Holley Bouche., M.D.     Dept. of [facility]  [address], [place]  [address]   Phone: [phone]   Fax: [phone]   DIET:  Drink plenty of non-alcoholic fluids & begin a light diet.  Resume your normal diet the day after surgery.  ACTIVITY:   You may use crutches or a walker with weight-bearing as tolerated, unless instructed otherwise.  You may wean yourself off of the walker or crutches as tolerated.   Begin doing gentle exercises. Exercising will reduce the pain and swelling, increase motion, and prevent muscle weakness.    Avoid strenuous activities or athletics for a minimum of 4-6 weeks after arthroscopic surgery.  Do not drive or operate any equipment until instructed.  WOUND CARE:   Place one to two pillows under the knee the first day or two when sitting or lying.   Continue to use the ice packs periodically to reduce pain and swelling.  The small  incisions in your knee are closed with nylon stitches. The stitches will be removed in the office.  The bulky dressing may be removed on the second day after surgery. DO NOT TOUCH THE STITCHES. Put a Band-Aid over each stitch. Do NOT use any ointments or creams on the incisions.   You may bathe or shower after the stitches are removed at the first office visit following surgery.  MEDICATIONS:  You may resume your regular medications.  Please take the pain medication as prescribed.  Do not take pain medication on an empty stomach.  Do not drive or drink alcoholic beverages when taking pain medications.  CALL THE OFFICE FOR:  Temperature above 101 degrees  Excessive bleeding or drainage on the dressing.  Excessive swelling, coldness, or paleness of the toes.  Persistent nausea and vomiting.  FOLLOW-UP:   You should have an appointment to return to the office in 7-10 days after surgery.

## 2019-05-11 NOTE — Op Note (Signed)
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   ASSISTANT: none  ANESTHESIA: general  ESTIMATED BLOOD LOSS: Minimal  FLUIDS REPLACED: 1100 mL of crystalloid  TOURNIQUET TIME: Not used  INDICATIONS FOR SURGERY: Patrick Barrera is a 63 y.o. year old male who has been seen for complaints of right knee pain. MRI demonstrated findings consistent with meniscal pathology. After discussion of the risks and benefits of surgical intervention, the patient expressed understanding of the risks benefits and agree with plans for right knee arthroscopy.   PROCEDURE IN DETAIL: The patient was brought into the operating room and, after adequate general anesthesia was achieved, a tourniquet was applied to the right thigh and the leg was placed in the leg holder. All bony prominences were well padded. The patient's right knee was cleaned and prepped with alcohol and Duraprep and draped in the usual sterile fashion. A "timeout" was performed as per usual protocol. The anticipated portal sites were injected with 0.25% Marcaine with epinephrine. An anterolateral incision was made and a cannula was inserted. A small effusion was evacuated and the knee was distended with fluid using the pump. The scope was advanced down the medial gutter into the medial compartment. Under visualization with the scope, an anteromedial portal was created and a hooked probe was inserted. The  medial meniscus was visualized and probed.  There was a horizontal tear of the posterior horn of the medial meniscus.  The tear was debrided using meniscal punches and a 4.5 mm incisor shaver.  Final contouring was performed using the 50 degree ArthroCare wand.  The articular cartilage was visualized.  There were grade 3 changes of chondromalacia involving primarily the medial femoral condyle.  These areas were debrided and contoured using the ArthroCare wand.  The scope was then advanced into the intercondylar notch. The anterior cruciate ligament was visualized and probed and felt to be intact. The scope was removed from the lateral portal and reinserted via the anteromedial portal to better visualize the lateral compartment. The lateral meniscus was visualized and probed.  Complex tears were noted to both the anterior and posterior horns of the lateral meniscus.  The tears were debrided using meniscal punches and a 4.5 mm incisor shaver.  Final contouring was performed using the ArthroCare wand.  The articular cartilage of the lateral compartment was visualized.  There was an area of grade IV chondromalacia with full-thickness loss of articular cartilage to the lateral tibial plateau.  The margins were contoured using the ArthroCare wand.  Finally, the scope was advanced so as to visualize the patellofemoral articulation. Good patellar tracking was appreciated.  The articular surface was in reasonably good condition.  The knee was irrigated with copius amounts of fluid and suctioned dry. The anterolateral portal was re-approximated with #3-0 nylon. A combination of 0.25% Marcaine with epinephrine and 4 mg of Morphine were injected via the scope. The scope was removed and the anteromedial portal was re-approximated with #3-0 nylon. A sterile  dressing was applied followed by application of an ice wrap.  The patient tolerated the procedure well and was transported to the PACU in stable condition.  Mc P.  Holley Bouche., M.D.

## 2019-05-11 NOTE — Transfer of Care (Signed)
Immediate Anesthesia Transfer of Care Note  Patient: Patrick Barrera  Procedure(s) Performed: ARTHROSCOPY KNEE- partial medial/lateral menisectomy  (Right Knee) CHONDROPLASTY (Right Knee)  Patient Location: PACU  Anesthesia Type:General  Level of Consciousness: sedated  Airway & Oxygen Therapy: Patient Spontanous Breathing and Patient connected to face mask oxygen  Post-op Assessment: Report given to RN and Post -op Vital signs reviewed and stable  Post vital signs: Reviewed and stable  Last Vitals:  Vitals Value Taken Time  BP 134/75 05/10/1653  Temp    Pulse 77 05/11/19 1658  Resp 15 05/11/19 1658  SpO2 100 % 05/11/19 1658  Vitals shown include unvalidated device data.  Last Pain:  Vitals:   05/11/19 1654  TempSrc:   PainSc: (P) Asleep         Complications: No apparent anesthesia complications

## 2019-05-11 NOTE — Anesthesia Preprocedure Evaluation (Signed)
Anesthesia Evaluation  Patient identified by MRN, date of birth, ID band Patient awake    Reviewed: Allergy & Precautions, NPO status , Patient's Chart, lab work & pertinent test results  History of Anesthesia Complications Negative for: history of anesthetic complications  Airway Mallampati: II  TM Distance: >3 FB Neck ROM: Full    Dental  (+) Poor Dentition, Missing   Pulmonary neg sleep apnea, neg COPD, former smoker,    breath sounds clear to auscultation- rhonchi (-) wheezing      Cardiovascular hypertension, Pt. on medications (-) angina+ CAD, + Past MI and + Cardiac Stents (2007 RCA stents)  (-) CABG  Rhythm:Regular Rate:Normal - Systolic murmurs and - Diastolic murmurs    Neuro/Psych neg Seizures negative neurological ROS  negative psych ROS   GI/Hepatic Neg liver ROS, GERD  ,  Endo/Other  negative endocrine ROSneg diabetes  Renal/GU negative Renal ROS     Musculoskeletal  (+) Arthritis ,   Abdominal (+) + obese,   Peds  Hematology negative hematology ROS (+)   Anesthesia Other Findings Past Medical History: No date: Cholesterol blood lowered No date: Coronary artery disease No date: Elevated lipids No date: GERD (gastroesophageal reflux disease) No date: History of Barrett's esophagus No date: History of rectal bleeding No date: Hypertension 11/07: Myocardial infarction ([place])     Comment:  With occlusion of RCA, stents x2   Reproductive/Obstetrics negative OB ROS                            Anesthesia Physical Anesthesia Plan  ASA: III  Anesthesia Plan: General   Post-op Pain Management:    Induction: Intravenous  PONV Risk Score and Plan: 1 and Ondansetron, Midazolam and Dexamethasone  Airway Management Planned: LMA  Additional Equipment:   Intra-op Plan:   Post-operative Plan:   Informed Consent: I have reviewed the patients History and Physical, chart,  labs and discussed the procedure including the risks, benefits and alternatives for the proposed anesthesia with the patient or authorized representative who has indicated his/her understanding and acceptance.     Dental advisory given  Plan Discussed with: CRNA and Anesthesiologist  Anesthesia Plan Comments:         Anesthesia Quick Evaluation

## 2019-05-11 NOTE — Anesthesia Procedure Notes (Signed)
Procedure Name: LMA Insertion Date/Time: 05/11/2019 3:10 PM Performed by: Dionne Bucy, CRNA Pre-anesthesia Checklist: Patient identified, Patient being monitored, Timeout performed, Emergency Drugs available and Suction available Patient Re-evaluated:Patient Re-evaluated prior to induction Oxygen Delivery Method: Circle system utilized Preoxygenation: Pre-oxygenation with 100% oxygen Induction Type: IV induction Ventilation: Mask ventilation without difficulty LMA: LMA inserted LMA Size: 4.0 Tube type: Oral Number of attempts: 1 Placement Confirmation: positive ETCO2 and breath sounds checked- equal and bilateral Tube secured with: Tape Dental Injury: Teeth and Oropharynx as per pre-operative assessment

## 2019-05-11 NOTE — TOC Progression Note (Signed)
Transition of Care [facility]) - Progression Note    Patient Details  Name: Patrick Barrera MRN: [id number] Date of Birth: 19-Apr-1956  Transition of Care [facility]) CM/SW [place], RN Phone Number: 05/11/2019, 9:13 AM  Clinical Narrative:     Requested the Price of lovenox, will notify the patient once obtained       Expected Discharge Plan and Services                                                 Social Determinants of Health (SDOH) Interventions    Readmission Risk Interventions No flowsheet data found.

## 2019-05-11 NOTE — TOC Benefit Eligibility Note (Signed)
Transition of Care [facility]) Benefit Eligibility Note    Patient Details  Name: Patrick Barrera MRN: [id number] Date of Birth: 04/02/1956   Medication/Dose: Enoxaparin 40 mg. daily x 14 days        Prescription Coverage Preferred Pharmacy: Any in-network pharmacy  Spoke with Person/Company/Phone Number:: Gabriel Cirri, [place], [phone]  Co-Pay: $79.52  or  $30.38 (Mail order - 24-48 processing time)             [place] Phone Number: 05/11/2019, 3:07 PM

## 2019-05-11 NOTE — Anesthesia Postprocedure Evaluation (Signed)
Anesthesia Post Note  Patient: Patrick Barrera  Procedure(s) Performed: ARTHROSCOPY KNEE- partial medial/lateral menisectomy  (Right Knee) CHONDROPLASTY (Right Knee)  Patient location during evaluation: PACU Anesthesia Type: General Level of consciousness: awake and alert Pain management: pain level controlled Vital Signs Assessment: post-procedure vital signs reviewed and stable Respiratory status: spontaneous breathing and respiratory function stable Cardiovascular status: stable Anesthetic complications: no     Last Vitals:  Vitals:   05/11/19 1739 05/11/19 1747  BP: (!) 149/75 135/75  Pulse: 81 79  Resp: 16 14  Temp:  36.8 C  SpO2: 99% 98%    Last Pain:  Vitals:   05/11/19 1758  TempSrc:   PainSc: 3                  [name] K

## 2019-07-06 ENCOUNTER — Other Ambulatory Visit: Payer: Self-pay

## 2019-07-06 ENCOUNTER — Encounter: Payer: Self-pay | Admitting: Family Medicine

## 2019-07-06 ENCOUNTER — Ambulatory Visit (INDEPENDENT_AMBULATORY_CARE_PROVIDER_SITE_OTHER): Payer: BC Managed Care – PPO | Admitting: Family Medicine

## 2019-07-06 VITALS — BP 116/60 | HR 73 | Temp 97.1°F | Resp 16 | Ht 70.0 in | Wt 220.0 lb

## 2019-07-06 DIAGNOSIS — Z125 Encounter for screening for malignant neoplasm of prostate: Secondary | ICD-10-CM

## 2019-07-06 DIAGNOSIS — I25118 Atherosclerotic heart disease of native coronary artery with other forms of angina pectoris: Secondary | ICD-10-CM

## 2019-07-06 DIAGNOSIS — I739 Peripheral vascular disease, unspecified: Secondary | ICD-10-CM

## 2019-07-06 DIAGNOSIS — Z23 Encounter for immunization: Secondary | ICD-10-CM | POA: Diagnosis not present

## 2019-07-06 DIAGNOSIS — Z0000 Encounter for general adult medical examination without abnormal findings: Secondary | ICD-10-CM | POA: Diagnosis not present

## 2019-07-06 NOTE — Patient Instructions (Signed)
.   Please review the attached list of medications and notify my office if there are any errors.   . Please bring all of your medications to every appointment so we can make sure that our medication list is the same as yours.   . It is especially important to get the annual flu vaccine this year. If you haven't had it already, please go to your pharmacy or call the office as soon as possible to schedule you flu shot.  

## 2019-07-06 NOTE — Progress Notes (Signed)
Patient: Patrick Barrera, Male    DOB: [birth year], 63 y.o.   MRN: [id number] Visit Date: 07/06/2019  Today's Provider: Lelon Huh, MD   Chief Complaint  Patient presents with  . Annual Exam   Subjective:     Complete Physical Patrick Barrera is a 63 y.o. male. He feels well, patient is working full time and is looking forward to retiring by end of year. He reports he does not regularly exercise due to recent knee surgery but is remaining active throughout his day. He reports he is sleeping fairly well, patient reports that he has been waking up at least once a night.  -----------------------------------------------------------   Review of Systems  Constitutional: Negative.   HENT: Negative.   Eyes: Negative.   Respiratory: Negative.   Cardiovascular: Negative.   Gastrointestinal: Positive for diarrhea.  Endocrine: Negative.   Genitourinary: Negative.   Musculoskeletal: Negative.   Allergic/Immunologic: Negative.   Neurological: Negative.   Hematological: Negative.   Psychiatric/Behavioral: Negative.     Social History   Socioeconomic History  . Marital status: Married    Spouse name: Not on file  . Number of children: 2  . Years of education: Not on file  . Highest education level: Not on file  Occupational History  . Occupation: MACHINE OPERATOR    Employer: [employer]    Comment: Full time  Social Needs  . Financial resource strain: Not on file  . Food insecurity    Worry: Not on file    Inability: Not on file  . Transportation needs    Medical: Not on file    Non-medical: Not on file  Tobacco Use  . Smoking status: Former Smoker    Packs/day: 1.00    Years: 45.00    Pack years: 45.00    Types: Cigarettes, E-cigarettes    Quit date: 07/12/2012    Years since quitting: 6.9  . Smokeless tobacco: Never Used  . Tobacco comment: changed from cigarettes to Vaping in 2013. Previousl 1.5 ppd since 12  Substance and Sexual Activity  . Alcohol  use: Yes    Alcohol/week: 1.0 standard drinks    Types: 1 Standard drinks or equivalent per week    Comment: moderate  . Drug use: No  . Sexual activity: Not on file  Lifestyle  . Physical activity    Days per week: Not on file    Minutes per session: Not on file  . Stress: Not on file  Relationships  . Social Herbalist on phone: Not on file    Gets together: Not on file    Attends religious service: Not on file    Active member of club or organization: Not on file    Attends meetings of clubs or organizations: Not on file    Relationship status: Not on file  . Intimate partner violence    Fear of current or ex partner: Not on file    Emotionally abused: Not on file    Physically abused: Not on file    Forced sexual activity: Not on file  Other Topics Concern  . Not on file  Social History Narrative   No regular exercise.    Past Medical History:  Diagnosis Date  . Cholesterol blood lowered   . Coronary artery disease   . Elevated lipids   . GERD (gastroesophageal reflux disease)   . History of Barrett's esophagus   . History of rectal bleeding   .  Hypertension   . Myocardial infarction ([place]) 11/07   With occlusion of RCA, stents x2     Patient Active Problem List   Diagnosis Date Noted  . Complex tear of lateral meniscus of right knee as current injury 03/20/2019  . Complex tear of medial meniscus of right knee as current injury 03/20/2019  . Primary osteoarthritis of right knee 03/20/2019  . S/P carpal tunnel release 11/09/2018  . Diastasis of rectus abdominis 02/18/2018  . History of adenomatous polyp of colon 11/12/2017  . Low testosterone in male 01/14/2017  . Barrett esophagus 12/06/2015  . Diverticulosis of colon 12/06/2015  . H/O acute myocardial infarction 12/06/2015  . Corrosion of second degree of head and neck 02/22/2014  . HTN (hypertension) 09/10/2011  . Hyperlipidemia 09/27/2010  . Smoking greater than 30 pack years 09/27/2010  .  CAD, NATIVE VESSEL 09/27/2010  . ATHEROSCLEROSIS OF AORTA 09/27/2010  . Peripheral artery disease ([place]) 09/27/2010  . ABDOMINAL BRUIT 09/27/2010  . Allergic rhinitis 11/30/2009  . ED (erectile dysfunction) of organic origin 11/19/2008  . 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 Acid reflux 10/08/2004  . Calcification of intervertebral cartilage or disc of cervical region 11/09/2003    Past Surgical History:  Procedure Laterality Date  . APPENDECTOMY    . CARPAL TUNNEL RELEASE Left 09/22/2018   Procedure: CARPAL TUNNEL RELEASE;  Surgeon: Dereck Leep, MD;  Location: [facility] ORS;  Service: Orthopedics;  Laterality: Left;  . CHONDROPLASTY Right 05/11/2019   Procedure: CHONDROPLASTY;  Surgeon: Dereck Leep, MD;  Location: [facility] ORS;  Service: Orthopedics;  Laterality: Right;  . COLON SURGERY     hyperplastic colon polyp removal  . COLONOSCOPY WITH PROPOFOL N/A 11/08/2017   Procedure: COLONOSCOPY WITH PROPOFOL;  Surgeon: Manya Silvas, MD;  Location: [facility] ENDOSCOPY;  Service: Endoscopy;  Laterality: N/A;  . CORONARY STENT PLACEMENT  2007   x2  . ESOPHAGOGASTRODUODENOSCOPY (EGD) WITH PROPOFOL N/A 11/08/2017   Procedure: ESOPHAGOGASTRODUODENOSCOPY (EGD) WITH PROPOFOL;  Surgeon: Manya Silvas, MD;  Location: [facility] ENDOSCOPY;  Service: Endoscopy;  Laterality: N/A;  . GANGLION CYST EXCISION Right   . KNEE ARTHROSCOPY Right 05/11/2019   Procedure: ARTHROSCOPY KNEE- partial medial/lateral menisectomy ;  Surgeon: Dereck Leep, MD;  Location: [facility] ORS;  Service: Orthopedics;  Laterality: Right;  . TONSILLECTOMY AND ADENOIDECTOMY    . UPPER GI ENDOSCOPY  04/23/14   barretts esophagus, repeat 04/2017    His family history includes Heart disease in his father; Pulmonary fibrosis in his father.   Current Outpatient Medications:  .  acetaminophen (TYLENOL) 500 MG tablet, Take 1,000 mg by mouth 3 (three) times daily as needed for moderate pain or headache., Disp: , Rfl:  .  aspirin EC 81 MG tablet,  Take 1 tablet (81 mg total) by mouth daily., Disp: , Rfl:  .  atorvastatin (LIPITOR) 80 MG tablet, TAKE 1 TABLET DAILY, Disp: 90 tablet, Rfl: 3 .  clopidogrel (PLAVIX) 75 MG tablet, TAKE 1 TABLET DAILY (Patient taking differently: Take 75 mg by mouth at bedtime. ), Disp: 90 tablet, Rfl: 4 .  diphenhydrAMINE (BENADRYL) 50 MG tablet, Take 50 mg by mouth at bedtime., Disp: , Rfl:  .  esomeprazole (NEXIUM) 20 MG capsule, Take 20 mg by mouth at bedtime., Disp: , Rfl:  .  HYDROcodone-acetaminophen (NORCO) 5-325 MG tablet, Take 1-2 tablets by mouth every 4 (four) hours as needed for moderate pain., Disp: 20 tablet, Rfl: 0 .  lisinopril (PRINIVIL,ZESTRIL) 10 MG tablet, Take 1 tablet (10 mg  total) by mouth daily. (Patient taking differently: Take 10 mg by mouth at bedtime. ), Disp: 90 tablet, Rfl: 4 .  meloxicam (MOBIC) 15 MG tablet, Take 15 mg by mouth daily., Disp: , Rfl:  .  metoprolol succinate (TOPROL-XL) 50 MG 24 hr tablet, Take 1 tablet (50 mg total) by mouth daily. (Patient taking differently: Take 50 mg by mouth at bedtime. ), Disp: 90 tablet, Rfl: 4 .  nitroGLYCERIN (NITROSTAT) 0.4 MG SL tablet, DISSOLVE 1 TABLET UNDER TONGUE EVERY 5 MIN AS NEED FOR CHEST PAIN. CALL 911 IF NO RELIEF AFTER 3DOSE (Patient taking differently: Place 0.4 mg under the tongue every 5 (five) minutes as needed for chest pain. ), Disp: 25 tablet, Rfl: 1 .  vitamin B-12 (CYANOCOBALAMIN) 1000 MCG tablet, Take 1,000 mcg by mouth at bedtime., Disp: , Rfl:   Patient Care Team: Birdie Sons, MD as PCP - General (Family Medicine) Minna Merritts, MD as PCP - Cardiology (Cardiology) Minna Merritts, MD as Consulting Physician (Cardiology) Manya Silvas, MD (Gastroenterology)     Objective:    Vitals: BP 116/60   Pulse 73   Temp (!) 97.1 F (36.2 C) (Oral)   Resp 16   Ht 5\' 10"  (1.778 m)   Wt 220 lb (99.8 kg)   SpO2 96%   BMI 31.57 kg/m   Physical Exam   General Appearance:    Overweight male. Alert,  cooperative, in no acute distress, appears stated age  Head:    Normocephalic, without obvious abnormality, atraumatic  Eyes:    PERRL, conjunctiva/corneas clear, EOM's intact, fundi    benign, both eyes       Ears:    Normal TM's and external ear canals, both ears  Nose:   Nares normal, septum midline, mucosa normal, no drainage   or sinus tenderness  Throat:   Lips, mucosa, and tongue normal; teeth and gums normal  Neck:   Supple, symmetrical, trachea midline, no adenopathy;       thyroid:  No enlargement/tenderness/nodules; no carotid   bruit or JVD  Back:     Symmetric, no curvature, ROM normal, no CVA tenderness  Lungs:     Clear to auscultation bilaterally, respirations unlabored  Chest wall:    No tenderness or deformity  Heart:    Normal heart rate. Normal rhythm. No murmurs, rubs, or gallops.  S1 and S2 normal  Abdomen:     Soft, non-tender, bowel sounds active all four quadrants,    no masses, no organomegaly  Genitalia:    deferred  Rectal:    deferred  Extremities:   All extremities are intact. No cyanosis or edema  Pulses:   2+ and symmetric all extremities  Skin:   Skin color, texture, turgor normal, no rashes or lesions  Lymph nodes:   Cervical, supraclavicular, and axillary nodes normal  Neurologic:   CNII-XII intact. Normal strength, sensation and reflexes      throughout    Activities of Daily Living In your present state of health, do you have any difficulty performing the following activities: 05/04/2019 09/11/2018  Hearing? N N  Vision? N N  Difficulty concentrating or making decisions? N N  Walking or climbing stairs? Y N  Dressing or bathing? N N  Doing errands, shopping? N N  Some recent data might be hidden    Fall Risk Assessment Fall Risk  07/06/2019 01/14/2017  Falls in the past year? 0 No  Number falls in past yr: 0 -  Injury with Fall? 0 -     Depression Screen PHQ 2/9 Scores 07/06/2019 07/06/2019 02/18/2018 01/14/2017  PHQ - 2 Score 0 0 0 0  PHQ- 9  Score 0 - - 0    No flowsheet data found.     Assessment & Plan:    Annual Physical Reviewed patient's Family Medical History Reviewed and updated list of patient's medical providers Assessment of cognitive impairment was done Assessed patient's functional ability Established a written schedule for health screening [place] Completed and Reviewed  Exercise Activities and Dietary recommendations Goals   None     Immunization History  Administered Date(s) Administered  . Influenza,inj,Quad PF,6+ Mos 07/14/2018  . Pneumococcal Polysaccharide-23 01/14/2017  . Td 02/18/2018  . Tdap 12/05/2007  . Zoster 12/09/2015    Health Maintenance  Topic Date Due  . HIV Screening  05/22/1969  . INFLUENZA VACCINE  05/09/2019  . PNA vac Low Risk Adult (1 of 2 - PCV13) 05/23/2019  . COLONOSCOPY  11/08/2022  . TETANUS/TDAP  02/19/2028  . Hepatitis C Screening  Completed     Discussed health benefits of physical activity, and encouraged him to engage in regular exercise appropriate for his age and condition.    ------------------------------------------------------------------------------------------------------------  1. Annual physical exam Counseled to limit alcohol to no more than 2 drinks in a day    2. Need for influenza vaccination  - Flu Vaccine QUAD High Dose(Fluad)  3. Peripheral artery disease (HCC) Asymptomatic. Compliant with medication.  Continue aggressive risk factor modification.    4. Atherosclerosis of native coronary artery of native heart with stable angina pectoris (HCC) Asymptomatic. Compliant with medication.  Continue aggressive risk factor modification.   He is tolerating atorvastatin well with no adverse effects.   - Comprehensive metabolic panel - Lipid panel - CBC  5. Prostate cancer screening  - PSA  6. Need for pneumococcal vaccination  - Pneumococcal conjugate vaccine 13-valent IM  7. Need for shingles vaccine   - Varicella-zoster vaccine IM  The entirety of the information documented in the History of Present Illness, Review of Systems and Physical Exam were personally obtained by me. Portions of this information were initially documented by Minette Headland, CMA and reviewed by me for thoroughness and accuracy.    Lelon Huh, MD  [facility]

## 2019-07-07 LAB — COMPREHENSIVE METABOLIC PANEL
ALT: 33 IU/L (ref 0–44)
AST: 26 IU/L (ref 0–40)
Albumin/Globulin Ratio: 1.4 (ref 1.2–2.2)
Albumin: 3.9 g/dL (ref 3.8–4.8)
Alkaline Phosphatase: 94 IU/L (ref 39–117)
BUN/Creatinine Ratio: 17 (ref 10–24)
BUN: 16 mg/dL (ref 8–27)
Bilirubin Total: 0.4 mg/dL (ref 0.0–1.2)
CO2: 24 mmol/L (ref 20–29)
Calcium: 9.5 mg/dL (ref 8.6–10.2)
Chloride: 106 mmol/L (ref 96–106)
Creatinine, Ser: 0.94 mg/dL (ref 0.76–1.27)
GFR calc Af Amer: 98 mL/min/{1.73_m2} (ref >59)
GFR calc non Af Amer: 85 mL/min/{1.73_m2} (ref >59)
Globulin, Total: 2.8 g/dL (ref 1.5–4.5)
Glucose: 98 mg/dL (ref 65–99)
Potassium: 4.5 mmol/L (ref 3.5–5.2)
Sodium: 143 mmol/L (ref 134–144)
Total Protein: 6.7 g/dL (ref 6.0–8.5)

## 2019-07-07 LAB — CBC
Hematocrit: 40 % (ref 37.5–51.0)
Hemoglobin: 13.5 g/dL (ref 13.0–17.7)
MCH: 32.6 pg (ref 26.6–33.0)
MCHC: 33.8 g/dL (ref 31.5–35.7)
MCV: 97 fL (ref 79–97)
Platelets: 217 10*3/uL (ref 150–450)
RBC: 4.14 x10E6/uL (ref 4.14–5.80)
RDW: 12.4 % (ref 11.6–15.4)
WBC: 7.4 10*3/uL (ref 3.4–10.8)

## 2019-07-07 LAB — LIPID PANEL
Chol/HDL Ratio: 4.5 ratio (ref 0.0–5.0)
Cholesterol, Total: 130 mg/dL (ref 100–199)
HDL: 29 mg/dL — ABNORMAL LOW (ref >39)
LDL Chol Calc (NIH): 64 mg/dL (ref 0–99)
Triglycerides: 228 mg/dL — ABNORMAL HIGH (ref 0–149)
VLDL Cholesterol Cal: 37 mg/dL (ref 5–40)

## 2019-07-07 LAB — PSA: Prostate Specific Ag, Serum: 0.5 ng/mL (ref 0.0–4.0)

## 2019-07-10 ENCOUNTER — Telehealth: Payer: Self-pay

## 2019-07-10 NOTE — Telephone Encounter (Signed)
Left message to call back  

## 2019-07-10 NOTE — Telephone Encounter (Signed)
-----   Message from Birdie Sons, MD sent at 07/07/2019  5:07 PM EDT ----- PSA, blood sugar, kidney functions, electrolytes and cholesterol are all normal. Check labs yearly.

## 2019-07-13 NOTE — Telephone Encounter (Signed)
Tried calling patient. Left message to call back. 

## 2019-07-13 NOTE — Telephone Encounter (Signed)
Patient advised.

## 2019-07-19 NOTE — Progress Notes (Signed)
Patient ID: Patrick Barrera, male   DOB: [birth year], 63 y.o.   MRN: [id number]   Cardiology Office Note  Date:  07/20/2019   ID:  Patrick Barrera, [place] 25-Aug-1956, MRN [id number]  PCP:  Birdie Sons, MD   Chief Complaint  Patient presents with  . office visit    12 mo F/U-CAD    HPI:  Patrick Barrera is a 63 year old gentleman with  HTN,  coronary artery disease,  occlusion of his RCA in November 2007 with 2 bare-metal stents placed at that time also with 70% mid LAD lesion that was not intervened upon,  long smoking history who stopped in October 2013,  COPD hyperlipidemia  who presents for routine followup of his coronary artery disease  In follow-up he reports that he is doing well Plans on retiring January 2021  No regular exercise program Denies any chest pain or shortness of breath Several hobbies, buying a new zero turn mower, takes care of 4 yards He has 86 year old mother, lives alone  Lab work reviewed with him Total chol 130 LDL 64 Glucose 98  EKG personally reviewed by myself on todays visit Shows normal sinus rhythm rate 68 bpm no significant ST or T wave changes, consider old inferior MI  PMH:   has a past medical history of Cholesterol blood lowered, Complex tear of lateral meniscus of right knee as current injury (03/20/2019), Complex tear of medial meniscus of right knee as current injury (03/20/2019), Coronary artery disease, Elevated lipids, GERD (gastroesophageal reflux disease), History of Barrett's esophagus, History of rectal bleeding, Hypertension, and Myocardial infarction ([place]) (11/07).  PSH:    Past Surgical History:  Procedure Laterality Date  . APPENDECTOMY    . CARPAL TUNNEL RELEASE Left 09/22/2018   Procedure: CARPAL TUNNEL RELEASE;  Surgeon: Dereck Leep, MD;  Location: [facility] ORS;  Service: Orthopedics;  Laterality: Left;  . CHONDROPLASTY Right 05/11/2019   Procedure: CHONDROPLASTY;  Surgeon: Dereck Leep, MD;  Location: [facility] ORS;   Service: Orthopedics;  Laterality: Right;  . COLON SURGERY     hyperplastic colon polyp removal  . COLONOSCOPY WITH PROPOFOL N/A 11/08/2017   Procedure: COLONOSCOPY WITH PROPOFOL;  Surgeon: Manya Silvas, MD;  Location[facility] END[facility]COPY;  Service: Endoscopy;  Laterality: N/A;  . CORONARY STENT PLACEMENT  2007   x2  . ESOPHAGOGASTRODUODENOSCOPY (EGD) WITH PROPOFOL N/A 11/08/2017   Procedure: ESOPHAGOGASTRODUODENOSCOPY (EGD) WITH PROPOFOL;  Surgeon: Manya Silvas, MD;  Location: [facility] ENDOSCOPY;  Service: Endoscopy;  Laterality: N/A;  . GANGLION CYST EXCISION Right   . KNEE ARTHROSCOPY Right 05/11/2019   Procedure: ARTHROSCOPY KNEE- partial medial/lateral menisectomy ;  Surgeon: Dereck Leep, MD;  Location: [facility] ORS;  Service: Orthopedics;  Laterality: Right;  . TONSILLECTOMY AND ADENOIDECTOMY    . UPPER GI ENDOSCOPY  04/23/14   barretts esophagus, repeat 04/2017    Current Outpatient Medications  Medication Sig Dispense Refill  . acetaminophen (TYLENOL) 500 MG tablet Take 1,000 mg by mouth 3 (three) times daily as needed for moderate pain or headache.    Marland Kitchen aspirin EC 81 MG tablet Take 1 tablet (81 mg total) by mouth daily.    Marland Kitchen atorvastatin (LIPITOR) 80 MG tablet TAKE 1 TABLET DAILY 90 tablet 3  . clopidogrel (PLAVIX) 75 MG tablet TAKE 1 TABLET DAILY (Patient taking differently: Take 75 mg by mouth at bedtime. ) 90 tablet 4  . diphenhydrAMINE (BENADRYL) 50 MG tablet Take 50 mg by mouth at bedtime.    Marland Kitchen esomeprazole (  NEXIUM) 20 MG capsule Take 20 mg by mouth at bedtime.    Marland Kitchen HYDROcodone-acetaminophen (NORCO) 5-325 MG tablet Take 1-2 tablets by mouth every 4 (four) hours as needed for moderate pain. 20 tablet 0  . lisinopril (PRINIVIL,ZESTRIL) 10 MG tablet Take 1 tablet (10 mg total) by mouth daily. (Patient taking differently: Take 10 mg by mouth at bedtime. ) 90 tablet 4  . meloxicam (MOBIC) 15 MG tablet Take 15 mg by mouth daily.    . metoprolol succinate (TOPROL-XL) 50 MG 24 hr tablet  Take 1 tablet (50 mg total) by mouth daily. (Patient taking differently: Take 50 mg by mouth at bedtime. ) 90 tablet 4  . nitroGLYCERIN (NITROSTAT) 0.4 MG SL tablet DISSOLVE 1 TABLET UNDER TONGUE EVERY 5 MIN AS NEED FOR CHEST PAIN. CALL 911 IF NO RELIEF AFTER 3DOSE (Patient taking differently: Place 0.4 mg under the tongue every 5 (five) minutes as needed for chest pain. ) 25 tablet 1  . vitamin B-12 (CYANOCOBALAMIN) 1000 MCG tablet Take 1,000 mcg by mouth at bedtime.     No current facility-administered medications for this visit.      Allergies:   Codeine and Ezetimibe-simvastatin   Social History:  The patient  reports that he quit smoking about 7 years ago. His smoking use included cigarettes and e-cigarettes. He has a 45.00 pack-year smoking history. He has never used smokeless tobacco. He reports current alcohol use of about 1.0 standard drinks of alcohol per week. He reports that he does not use drugs.   Family History:   family history includes Heart disease in his father; Pulmonary fibrosis in his father.    Review of Systems: Review of Systems  Constitutional: Negative.   Respiratory: Negative.   Cardiovascular: Negative.   Gastrointestinal: Negative.   Musculoskeletal: Negative.   Neurological: Negative.   Psychiatric/Behavioral: Negative.   All other systems reviewed and are negative.   PHYSICAL EXAM: VS:  BP (!) 136/100 (BP Location: Left Arm, Patient Position: Sitting, Cuff Size: Normal)   Pulse 68   Temp 97.7 F (36.5 C)   Ht 5\' 10"  (1.778 m)   Wt 215 lb 8 oz (97.8 kg)   SpO2 94%   BMI 30.92 kg/m  , BMI Body mass index is 30.92 kg/m. Constitutional:  oriented to person, place, and time. No distress.  HENT:  Head: Grossly normal Eyes:  no discharge. No scleral icterus.  Neck: No JVD, no carotid bruits  Cardiovascular: Regular rate and rhythm, no murmurs appreciated Pulmonary/Chest: Clear to auscultation bilaterally, no wheezes or rails Abdominal: Soft.  no  distension.  no tenderness.  Musculoskeletal: Normal range of motion Neurological:  normal muscle tone. Coordination normal. No atrophy Skin: Skin warm and dry Psychiatric: normal affect, pleasant   Recent Labs: 07/06/2019: ALT 33; BUN 16; Creatinine, Ser 0.94; Hemoglobin 13.5; Platelets 217; Potassium 4.5; Sodium 143    Lipid Panel Lab Results  Component Value Date   CHOL 130 07/06/2019   HDL 29 (L) 07/06/2019   LDLCALC 64 07/06/2019   TRIG 228 (H) 07/06/2019      Wt Readings from Last 3 Encounters:  07/20/19 215 lb 8 oz (97.8 kg)  07/06/19 220 lb (99.8 kg)  05/11/19 211 lb 12.8 oz (96.1 kg)      ASSESSMENT AND PLAN:  Atherosclerosis of native coronary artery of native heart without angina pectoris - Plan: EKG 12-Lead Currently with no symptoms of angina. No further workup at this time. Continue current medication regimen. Stable  Essential  hypertension - Plan: EKG 12-Lead Blood pressure is well controlled on today's visit. No changes made to the medications. Stable  SMOKER Stop smoking several years ago, 2013 Unable to exclude mild COPD Recommended weight loss, exercise program  Hyperlipidemia Cholesterol is at goal on the current lipid regimen. No changes to the medications were made.  No changes/stable   Total encounter time more than 25 minutes  Greater than 50% was spent in counseling and coordination of care with the patient   Disposition:   F/U  12 months   No orders of the defined types were placed in this encounter.    Signed, Esmond Plants, M.D., Ph.D. 07/20/2019  [place], [place]

## 2019-07-20 ENCOUNTER — Ambulatory Visit (INDEPENDENT_AMBULATORY_CARE_PROVIDER_SITE_OTHER): Payer: BC Managed Care – PPO | Admitting: Cardiovascular Disease

## 2019-07-20 ENCOUNTER — Other Ambulatory Visit: Payer: Self-pay

## 2019-07-20 ENCOUNTER — Encounter: Payer: Self-pay | Admitting: Cardiovascular Disease

## 2019-07-20 VITALS — BP 136/80 | HR 68 | Temp 97.7°F | Ht 70.0 in | Wt 215.5 lb

## 2019-07-20 DIAGNOSIS — I25118 Atherosclerotic heart disease of native coronary artery with other forms of angina pectoris: Secondary | ICD-10-CM

## 2019-07-20 DIAGNOSIS — I739 Peripheral vascular disease, unspecified: Secondary | ICD-10-CM | POA: Diagnosis not present

## 2019-07-20 DIAGNOSIS — I10 Essential (primary) hypertension: Secondary | ICD-10-CM

## 2019-07-20 DIAGNOSIS — E782 Mixed hyperlipidemia: Secondary | ICD-10-CM

## 2019-07-20 NOTE — Patient Instructions (Signed)

## 2019-08-11 DIAGNOSIS — M1711 Unilateral primary osteoarthritis, right knee: Secondary | ICD-10-CM | POA: Diagnosis not present

## 2019-08-18 DIAGNOSIS — M1711 Unilateral primary osteoarthritis, right knee: Secondary | ICD-10-CM | POA: Diagnosis not present

## 2019-08-25 DIAGNOSIS — M1711 Unilateral primary osteoarthritis, right knee: Secondary | ICD-10-CM | POA: Diagnosis not present

## 2019-09-08 ENCOUNTER — Telehealth: Payer: Self-pay

## 2019-09-08 NOTE — Telephone Encounter (Signed)
Copied from [employer] [id number]. Topic: General - Inquiry >> Sep 08, 2019  3:07 PM Mathis Bud wrote: Reason for CRM: Geoffery Spruce called from [facility] requesting coding for medication claim.  Day of service 9.28.20  Call back [phone]

## 2019-09-11 NOTE — Telephone Encounter (Signed)
Spoke with [employer] and the patient's flu vaccine [phone] was denied and not covered under patients plan.

## 2019-10-11 ENCOUNTER — Other Ambulatory Visit: Payer: Self-pay | Admitting: Cardiovascular Disease

## 2019-11-13 ENCOUNTER — Telehealth: Payer: Self-pay | Admitting: Family Medicine

## 2019-11-13 DIAGNOSIS — Z125 Encounter for screening for malignant neoplasm of prostate: Secondary | ICD-10-CM

## 2019-11-13 NOTE — Telephone Encounter (Signed)
Patient's wife is calling to ask she receive a bill from [facility] for a prorate exam. However, it should have been covered under preventative.Please advise CB- [phone]

## 2019-11-13 NOTE — Telephone Encounter (Signed)
Does she mean PSA? Prostate specific antigen?

## 2019-11-16 NOTE — Telephone Encounter (Signed)
Patient's wife states it is PSA. She contacted [employer] a couple months ago and [employer] was suppose to fax a form with the code for correction. She states she will contact [facility] again to have form refaxed.

## 2019-11-18 ENCOUNTER — Other Ambulatory Visit: Payer: Self-pay | Admitting: Cardiovascular Disease

## 2019-11-20 ENCOUNTER — Other Ambulatory Visit: Payer: Self-pay | Admitting: Family Medicine

## 2019-11-20 NOTE — Telephone Encounter (Signed)
Notes to clinic: [facility] I am unable to fill for medication for this office   Requested Prescriptions  Pending Prescriptions Disp Refills   atorvastatin (LIPITOR) 80 MG tablet 90 tablet 0    Sig: Take 1 tablet (80 mg total) by mouth daily.      Cardiovascular:  Antilipid - Statins Failed - 11/20/2019 11:51 AM      Failed - LDL in normal range and within 360 days    LDL Chol Calc (NIH)  Date Value Ref Range Status  07/06/2019 64 0 - 99 mg/dL Final          Failed - HDL in normal range and within 360 days    HDL  Date Value Ref Range Status  07/06/2019 29 (L) >39 mg/dL Final          Failed - Triglycerides in normal range and within 360 days    Triglycerides  Date Value Ref Range Status  07/06/2019 228 (H) 0 - 149 mg/dL Final          Failed - Valid encounter within last 12 months    Recent Outpatient Visits           4 months ago Annual physical exam   [facility] Birdie Sons, MD   1 year ago Annual physical exam   [facility] Birdie Sons, MD   2 years ago Diarrhea, unspecified type   [facility] Birdie Sons, MD   2 years ago Annual physical exam   [facility] Birdie Sons, MD   3 years ago Annual physical exam   [facility] Birdie Sons, MD              Passed - Total Cholesterol in normal range and within 360 days    Cholesterol, Total  Date Value Ref Range Status  07/06/2019 130 100 - 199 mg/dL Final          Passed - Patient is not pregnant        lisinopril (ZESTRIL) 10 MG tablet 90 tablet 3    Sig: Take 1 tablet (10 mg total) by mouth daily.      Cardiovascular:  ACE Inhibitors Failed - 11/20/2019 11:51 AM      Failed - Valid encounter within last 6 months    Recent Outpatient Visits           4 months ago Annual physical exam   [facility] Birdie Sons, MD   1  year ago Annual physical exam   [facility] Birdie Sons, MD   2 years ago Diarrhea, unspecified type   [facility] Birdie Sons, MD   2 years ago Annual physical exam   [facility] Birdie Sons, MD   3 years ago Annual physical exam   [facility] Birdie Sons, MD              Passed - Cr in normal range and within 180 days    Creatinine, Ser  Date Value Ref Range Status  07/06/2019 0.94 0.76 - 1.27 mg/dL Final          Passed - K in normal range and within 180 days    Potassium  Date Value Ref Range Status  07/06/2019 4.5 3.5 - 5.2 mmol/L Final  Passed - Patient is not pregnant      Passed - Last BP in normal range    BP Readings from Last 1 Encounters:  07/20/19 136/80            clopidogrel (PLAVIX) 75 MG tablet 90 tablet 3    Sig: Take 1 tablet (75 mg total) by mouth daily.      Hematology: Antiplatelets - clopidogrel Failed - 11/20/2019 11:51 AM      Failed - Evaluate AST, ALT within 2 months of therapy initiation.      Failed - Valid encounter within last 6 months    Recent Outpatient Visits           4 months ago Annual physical exam   [facility] Birdie Sons, MD   1 year ago Annual physical exam   [facility] Birdie Sons, MD   2 years ago Diarrhea, unspecified type   [facility] Birdie Sons, MD   2 years ago Annual physical exam   [facility] Birdie Sons, MD   3 years ago Annual physical exam   [facility] Birdie Sons, [facility]              Passed - ALT in normal range and within 360 days    ALT  Date Value Ref Range Status  07/06/2019 33 0 - 44 IU/L Final          Passed - AST in normal range and within 360 days    AST  Date Value Ref Range Status  07/06/2019 26 0 - 40 IU/L Final          Passed - HCT in normal range and within 180 days    Hematocrit   Date Value Ref Range Status  07/06/2019 40.0 37.5 - 51.0 % Final          Passed - HGB in normal range and within 180 days    Hemoglobin  Date Value Ref Range Status  07/06/2019 13.5 13.0 - 17.7 g/dL Final          Passed - PLT in normal range and within 180 days    Platelets  Date Value Ref Range Status  07/06/2019 217 150 - 450 x10E3/uL Final

## 2019-11-20 NOTE — Telephone Encounter (Signed)
Copied from [facility] [id number]. Topic: Quick Communication - Rx Refill/Question >> Nov 20, 2019 11:41 AM Rainey Pines A wrote: Medication: atorvastatin (LIPITOR) 80 MG tablet ,clopidogrel (PLAVIX) 75 MG tablet,lisinopril (ZESTRIL) 10 MG tablet,metoprolol succinate (TOPROL-XL) 50 MG 24 hr tablet (Patients wife stated that medications were sent to the wrong pharmacy and would like medications sent to pharmacy below.)  Has the patient contacted their pharmacy? Yes (Agent: If no, request that the patient contact the pharmacy for the refill.) (Agent: If yes, when and what did the pharmacy advise?)Contact PCP  Preferred Pharmacy (with phone number or street name[id number] [place], [place] [facility] [address]  Phone:  [phone] Fax:  [phone]     Agent: Please be advised that RX refills may take up to 3 business days. We ask that you follow-up with your pharmacy.

## 2019-12-08 NOTE — Telephone Encounter (Signed)
Patient's wife states she did contact [facility] to fax the form. Patient recently received another bill. She will contact [facility] again to see why they did not fax the form.

## 2019-12-08 NOTE — Telephone Encounter (Signed)
Pts wife called back stating that [facility] states that they do not have a code correction form. She states that [facility] states a letter can be written on letterhead with the pts invoice number and the corrected code or call them with the invoice number and corrected code. Please advise.    Invoice # [id number]

## 2019-12-08 NOTE — Telephone Encounter (Signed)
Did they call [facility] about the PSA billing? I still haven't seen form from them.

## 2019-12-11 ENCOUNTER — Other Ambulatory Visit: Payer: Self-pay | Admitting: Cardiovascular Disease

## 2019-12-11 MED ORDER — ATORVASTATIN CALCIUM 80 MG PO TABS: 80.0000 mg | ORAL_TABLET | Freq: Every day | ORAL | 1 refills | Status: DC

## 2019-12-11 MED ORDER — LISINOPRIL 10 MG PO TABS: 10.0000 mg | ORAL_TABLET | Freq: Every day | ORAL | 1 refills | Status: DC

## 2019-12-11 MED ORDER — NITROGLYCERIN 0.4 MG SL SUBL: SUBLINGUAL_TABLET | SUBLINGUAL | 1 refills | Status: DC

## 2019-12-11 MED ORDER — CLOPIDOGREL BISULFATE 75 MG PO TABS: 75.0000 mg | ORAL_TABLET | Freq: Every day | ORAL | 1 refills | Status: DC

## 2019-12-11 MED ORDER — METOPROLOL SUCCINATE ER 50 MG PO TB24: 50.0000 mg | ORAL_TABLET | Freq: Every day | ORAL | 1 refills | Status: DC

## 2019-12-11 NOTE — Telephone Encounter (Signed)
Requested Prescriptions   Signed Prescriptions Disp Refills   atorvastatin (LIPITOR) 80 MG tablet 90 tablet 1    Sig: Take 1 tablet (80 mg total) by mouth daily.    Authorizing Provider: Minna Merritts    Ordering User: Raelene Bott, Jazarah Capili L   lisinopril (ZESTRIL) 10 MG tablet 90 tablet 1    Sig: Take 1 tablet (10 mg total) by mouth daily.    Authorizing Provider: Minna Merritts    Ordering User: Raelene Bott, Naina Sleeper L   metoprolol succinate (TOPROL-XL) 50 MG 24 hr tablet 90 tablet 1    Sig: Take 1 tablet (50 mg total) by mouth daily. Take with or immediately following a meal.    Authorizing Provider: Minna Merritts    Ordering User: NEWCOMER MCCLAIN, Berda Shelvin L   nitroGLYCERIN (NITROSTAT) 0.4 MG SL tablet 25 tablet 1    Sig: DISSOLVE 1 TABLET UNDER TONGUE EVERY 5 MIN AS NEED FOR CHEST PAIN. CALL 911 IF NO RELIEF AFTER 3DOSE    Authorizing Provider: Minna Merritts    Ordering User: NEWCOMER MCCLAIN, Kobe Ofallon L   clopidogrel (PLAVIX) 75 MG tablet 90 tablet 1    Sig: Take 1 tablet (75 mg total) by mouth daily.    Authorizing Provider: Minna Merritts    Ordering User: Raelene Bott, Madysin Crisp L

## 2019-12-11 NOTE — Telephone Encounter (Signed)
*  STAT* If patient is at the pharmacy, call can be transferred to refill team.   1. Which medications need to be refilled? (please list name of each medication and dose if known) atorvastatin 80 mg, Plavix 75 mg, metoprolol succinate 50 mg,  Lisinopril 10 mg, nitroglycerinn.4mg   2. Which pharmacy/location (including street and city if local pharmacy) is medication to be sent to[facility]  3. Do they need a 30 day or 90 day supply? 90  ALL FUTURE RX's WILL NEED TO BE SENT TO THIS LOCATION

## 2019-12-21 ENCOUNTER — Other Ambulatory Visit: Payer: Self-pay

## 2019-12-21 MED ORDER — ATORVASTATIN CALCIUM 80 MG PO TABS: 80.0000 mg | ORAL_TABLET | Freq: Every day | ORAL | 1 refills | Status: DC

## 2019-12-21 MED ORDER — LISINOPRIL 10 MG PO TABS: 10.0000 mg | ORAL_TABLET | Freq: Every day | ORAL | 1 refills | Status: DC

## 2019-12-22 ENCOUNTER — Other Ambulatory Visit: Payer: Self-pay

## 2019-12-22 MED ORDER — CLOPIDOGREL BISULFATE 75 MG PO TABS: 75.0000 mg | ORAL_TABLET | Freq: Every day | ORAL | 1 refills | Status: DC

## 2019-12-22 MED ORDER — METOPROLOL SUCCINATE ER 50 MG PO TB24: 50.0000 mg | ORAL_TABLET | Freq: Every day | ORAL | 1 refills | Status: DC

## 2019-12-24 ENCOUNTER — Other Ambulatory Visit: Payer: Self-pay

## 2019-12-24 MED ORDER — NITROGLYCERIN 0.4 MG SL SUBL: SUBLINGUAL_TABLET | SUBLINGUAL | 3 refills | Status: DC

## 2019-12-24 MED ORDER — METOPROLOL SUCCINATE ER 50 MG PO TB24: 50.0000 mg | ORAL_TABLET | Freq: Every day | ORAL | 3 refills | Status: DC

## 2019-12-24 MED ORDER — CLOPIDOGREL BISULFATE 75 MG PO TABS: 75.0000 mg | ORAL_TABLET | Freq: Every day | ORAL | 3 refills | Status: DC

## 2019-12-30 ENCOUNTER — Other Ambulatory Visit: Payer: Self-pay

## 2019-12-30 MED ORDER — LISINOPRIL 10 MG PO TABS: 10.0000 mg | ORAL_TABLET | Freq: Every day | ORAL | 3 refills | Status: DC

## 2019-12-30 MED ORDER — ATORVASTATIN CALCIUM 80 MG PO TABS: 80.0000 mg | ORAL_TABLET | Freq: Every day | ORAL | 3 refills | Status: DC

## 2019-12-30 MED ORDER — NITROGLYCERIN 0.4 MG SL SUBL: SUBLINGUAL_TABLET | SUBLINGUAL | 3 refills | Status: DC

## 2019-12-30 MED ORDER — CLOPIDOGREL BISULFATE 75 MG PO TABS: 75.0000 mg | ORAL_TABLET | Freq: Every day | ORAL | 3 refills | Status: DC

## 2019-12-30 MED ORDER — METOPROLOL SUCCINATE ER 50 MG PO TB24: 50.0000 mg | ORAL_TABLET | Freq: Every day | ORAL | 3 refills | Status: DC

## 2020-08-19 ENCOUNTER — Telehealth: Payer: Self-pay | Admitting: Cardiovascular Disease

## 2020-08-19 NOTE — Telephone Encounter (Signed)
   [facility] Pre-operative Risk Assessment    HEARTCARE STAFF: - Please ensure there is not already an duplicate clearance open for this procedure. - Under Visit Info/Reason for Call, type in Other and utilize the format Clearance MM/DD/YY or Clearance TBD. Do not use dashes or single digits. - If request is for dental extraction, please clarify the # of teeth to be extracted.  Request for surgical clearance:  1. What type of surgery is being performed? c4-5 anterior cervical fusion  2. When is this surgery scheduled? TBD  3. What type of clearance is required (medical clearance vs. Pharmacy clearance to hold med vs. Both)? both  4. Are there any medications that need to be held prior to surgery and how long? Not noted  5. Practice name and name of physician performing surgery? [facility]. Earnie Larsson  6. What is the office phone number? [phone]   7.   What is the office fax number? [phone]  8.   Anesthesia type (None, local, MAC, general) ? general  Patient has upcoming appt with [facility] on 11/16   Saunders Glance Bumgarner 08/19/2020, 3:30 PM  _________________________________________________________________   (provider comments below)

## 2020-08-19 NOTE — Telephone Encounter (Signed)
Primary Cardiologist:Patrick Rockey Situ, MD  Chart reviewed as part of pre-operative protocol coverage. Because of Patrick Barrera's past medical history and time since last visit, he/she will require a follow-up visit in order to better assess preoperative cardiovascular risk.  Pre-op covering staff: - Please schedule appointment and call patient to inform them. - Please contact requesting surgeon's office via preferred method (i.e, phone, fax) to inform them of need for appointment prior to surgery.  If applicable, this message will also be routed to pharmacy pool and/or primary cardiologist for input on holding anticoagulant/antiplatelet agent as requested below so that this information is available at time of patient's appointment.   Deberah Pelton, NP  08/19/2020, 3:39 PM

## 2020-08-19 NOTE — Telephone Encounter (Signed)
Pt has appt 08/23/20 with Dr. Rockey Situ. Added Pre Op Clearance needed to appt notes. Will send clearance request to MD for upcoming appt. Will remove from the pre op call back pool.

## 2020-08-22 NOTE — Progress Notes (Signed)
Patient ID: Patrick Barrera, male   DOB: 08/16/1956, 64 y.o.   MRN: [id number]   Cardiology Office Note  Date:  08/23/2020   ID:  Patrick Barrera, [place] Nov 13, 1955, MRN [id number]  PCP:  Birdie Sons, MD   Chief Complaint  Patient presents with  . Other    12 month follow up. Needs clearance - Neck surgery. Meds reviewed verbally with patient.     HPI:  Patrick Barrera is a 64 year old gentleman with  long smoking history who stopped in October 2013,  COPD HTN,  coronary artery disease,  occlusion of his RCA in November 2007 with 2 bare-metal stents placed at that time also with 70% mid LAD lesion that was not intervened upon,  hyperlipidemia  who presents for routine followup of his coronary artery disease  Scheduled for c4-5 anterior cervical fusion With [facility] Dr. Annette Stable He is on Plavix,  aspirin  Denies anginal symptoms No shortness of breath on exertion Typically active more in the summer, Does mowing, takes care of 4 yards He has 33 year old mother, lives alone  Having knee pain  Lab work reviewed with him Lab Results  Component Value Date   CHOL 130 07/06/2019   HDL 29 (L) 07/06/2019   [place] 64 07/06/2019   TRIG 228 (H) 07/06/2019   EKG personally reviewed by myself on todays visit Shows normal sinus rhythm rate 68 bpm no significant ST or T wave changes, consider old inferior MI  PMH:   has a past medical history of Cholesterol blood lowered, Complex tear of lateral meniscus of right knee as current injury (03/20/2019), Complex tear of medial meniscus of right knee as current injury (03/20/2019), Coronary artery disease, Elevated lipids, GERD (gastroesophageal reflux disease), History of Barrett's esophagus, History of rectal bleeding, Hypertension, and Myocardial infarction ([place]) (11/07).  PSH:    Past Surgical History:  Procedure Laterality Date  . APPENDECTOMY    . CARPAL TUNNEL RELEASE Left 09/22/2018   Procedure: CARPAL TUNNEL RELEASE;   Surgeon: Dereck Leep, MD;  Location: [facility] ORS;  Service: Orthopedics;  Laterality: Left;  . CHONDROPLASTY Right 05/11/2019   Procedure: CHONDROPLASTY;  Surgeon: Dereck Leep, MD;  Location: [facility] ORS;  Service: Orthopedics;  Laterality: Right;  . COLON SURGERY     hyperplastic colon polyp removal  . COLONOSCOPY WITH PROPOFOL N/A 11/08/2017   Procedure: COLONOSCOPY WITH PROPOFOL;  Surgeon: Manya Silvas, MD;  Location: [facility] ENDOSCOPY;  Service: Endoscopy;  Laterality: N/A;  . CORONARY STENT PLACEMENT  2007   x2  . ESOPHAGOGASTRODUODENOSCOPY (EGD) WITH PROPOFOL N/A 11/08/2017   Procedure: ESOPHAGOGASTRODUODENOSCOPY (EGD) WITH PROPOFOL;  Surgeon: Manya Silvas, MD;  Location: [facility] ENDOSCOPY;  Service: Endoscopy;  Laterality: N/A;  . GANGLION CYST EXCISION Right   . KNEE ARTHROSCOPY Right 05/11/2019   Procedure: ARTHROSCOPY KNEE- partial medial/lateral menisectomy ;  Surgeon: Dereck Leep, MD;  Location: [facility] ORS;  Service: Orthopedics;  Laterality: Right;  . TONSILLECTOMY AND ADENOIDECTOMY    . UPPER GI ENDOSCOPY  04/23/14   barretts esophagus, repeat 04/2017    Current Outpatient Medications  Medication Sig Dispense Refill  . acetaminophen (TYLENOL) 500 MG tablet Take 1,000 mg by mouth 3 (three) times daily as needed for moderate pain or headache.    Marland Kitchen atorvastatin (LIPITOR) 80 MG tablet Take 1 tablet (80 mg total) by mouth daily. 90 tablet 3  . clopidogrel (PLAVIX) 75 MG tablet Take 1 tablet (75 mg total) by mouth daily. 90 tablet  3  . diphenhydrAMINE (BENADRYL) 50 MG tablet Take 50 mg by mouth at bedtime.    Marland Kitchen esomeprazole (NEXIUM) 20 MG capsule Take 20 mg by mouth at bedtime.    Marland Kitchen lisinopril (ZESTRIL) 10 MG tablet Take 1 tablet (10 mg total) by mouth daily. 90 tablet 3  . meloxicam (MOBIC) 15 MG tablet Take 15 mg by mouth daily.    . nitroGLYCERIN (NITROSTAT) 0.4 MG SL tablet DISSOLVE 1 TABLET UNDER TONGUE EVERY 5 MIN AS NEED FOR CHEST PAIN. CALL 911 IF NO RELIEF AFTER 3DOSE 25  tablet 3  . vitamin B-12 (CYANOCOBALAMIN) 1000 MCG tablet Take 1,000 mcg by mouth at bedtime.    Marland Kitchen aspirin EC 81 MG tablet Take 1 tablet (81 mg total) by mouth daily. Swallow whole. 90 tablet 3   No current facility-administered medications for this visit.     Allergies:   Codeine and Ezetimibe-simvastatin   Social History:  The patient  reports that he quit smoking about 8 years ago. His smoking use included cigarettes and e-cigarettes. He has a 45.00 pack-year smoking history. He has never used smokeless tobacco. He reports current alcohol use of about 1.0 standard drink of alcohol per week. He reports that he does not use drugs.   Family History:   family history includes Heart disease in his father; Pulmonary fibrosis in his father.    Review of Systems: Review of Systems  Constitutional: Negative.   Respiratory: Negative.   Cardiovascular: Negative.   Gastrointestinal: Negative.   Musculoskeletal: Negative.   Neurological: Negative.   Psychiatric/Behavioral: Negative.   All other systems reviewed and are negative.   PHYSICAL EXAM: VS:  BP 134/70 (BP Location: Left Arm, Patient Position: Sitting, Cuff Size: Normal)   Pulse 60   Ht 5\' 10"  (1.778 m)   Wt 222 lb (100.7 kg)   SpO2 97%   BMI 31.85 kg/m  , BMI Body mass index is 31.85 kg/m. Constitutional:  oriented to person, place, and time. No distress.  HENT:  Head: Grossly normal Eyes:  no discharge. No scleral icterus.  Neck: No JVD, no carotid bruits  Cardiovascular: Regular rate and rhythm, no murmurs appreciated Pulmonary/Chest: Clear to auscultation bilaterally, no wheezes or rails Abdominal: Soft.  no distension.  no tenderness.  Musculoskeletal: Normal range of motion Neurological:  normal muscle tone. Coordination normal. No atrophy Skin: Skin warm and dry Psychiatric: normal affect, pleasant  Recent Labs: No results found for requested labs within last 8760 hours.    Lipid Panel Lab Results   Component Value Date   CHOL 130 07/06/2019   HDL 29 (L) 07/06/2019   LDLCALC 64 07/06/2019   TRIG 228 (H) 07/06/2019      Wt Readings from Last 3 Encounters:  08/23/20 222 lb (100.7 kg)  07/20/19 215 lb 8 oz (97.8 kg)  07/06/19 220 lb (99.8 kg)      ASSESSMENT AND PLAN:  Preop cardiovascular evaluation Acceptable risk for procedure, no further testing needed Hold Plavix 5 days prior to procedure (7 if needed) Take aspirin when not on Plavix in the perioperative.  Atherosclerosis of native coronary artery of native heart without angina pectoris - Plan: EKG 12-Lead Currently with no symptoms of angina. No further workup at this time. Continue current medication regimen.  Essential hypertension - Plan: EKG 12-Lead Blood pressure is well controlled on today's visit. No changes made to the medications. Stable  SMOKER Stop smoking several years ago, 2013 mild COPD Recommended weight loss, exercise program  Hyperlipidemia Cholesterol is at goal on the current lipid regimen. No changes to the medications were made.   Total encounter time more than 25 minutes  Greater than 50% was spent in counseling and coordination of care with the patient    Orders Placed This Encounter  Procedures  . EKG 12-Lead     Signed, Esmond Plants, M.D., Ph.D. 08/23/2020  [place], [place]

## 2020-08-23 ENCOUNTER — Other Ambulatory Visit: Payer: Self-pay

## 2020-08-23 ENCOUNTER — Ambulatory Visit (INDEPENDENT_AMBULATORY_CARE_PROVIDER_SITE_OTHER): Payer: Medicare Other | Admitting: Cardiovascular Disease

## 2020-08-23 ENCOUNTER — Encounter: Payer: Self-pay | Admitting: Cardiovascular Disease

## 2020-08-23 VITALS — BP 134/70 | HR 60 | Ht 70.0 in | Wt 222.0 lb

## 2020-08-23 DIAGNOSIS — E782 Mixed hyperlipidemia: Secondary | ICD-10-CM | POA: Diagnosis not present

## 2020-08-23 DIAGNOSIS — I739 Peripheral vascular disease, unspecified: Secondary | ICD-10-CM

## 2020-08-23 DIAGNOSIS — I25118 Atherosclerotic heart disease of native coronary artery with other forms of angina pectoris: Secondary | ICD-10-CM | POA: Diagnosis not present

## 2020-08-23 DIAGNOSIS — I10 Essential (primary) hypertension: Secondary | ICD-10-CM

## 2020-08-23 MED ORDER — ASPIRIN EC 81 MG PO TBEC: 81.0000 mg | DELAYED_RELEASE_TABLET | Freq: Every day | ORAL | 3 refills | Status: AC

## 2020-08-23 NOTE — Patient Instructions (Addendum)
Medication Instructions:  Please hold the plavix at least 5 days prior to neck surgery aspirin EC 81 MG tablet              Take 1 tablet (81 mg total) by mouth daily. Swallow whole., Starting Tue 11/16/202                  If you need a refill on your cardiac medications before your next appointment, please call your pharmacy.    Lab work: No new labs needed   If you have labs (blood work) drawn today and your tests are completely normal, you will receive your results only by: Marland Kitchen MyChart Message (if you have MyChart) OR . A paper copy in the mail If you have any lab test that is abnormal or we need to change your treatment, we will call you to review the results.   Testing/Procedures: No new testing needed   Follow-Up: At [facility], you and your health needs are our priority.  As part of our continuing mission to provide you with exceptional heart care, we have created designated Provider Care Teams.  These Care Teams include your primary Cardiologist (physician) and Advanced Practice Providers (APPs -  Physician Assistants and Nurse Practitioners) who all work together to provide you with the care you need, when you need it.  . You will need a follow up appointment in 12 months  . Providers on your designated Care Team:   . Murray Hodgkins, NP . Christell Faith, PA-C . Marrianne Mood, PA-C  Any Other Special Instructions Will Be Listed Below (If Applicable).  COVID-19 Vaccine Information can be found at: [email or web address].uk For questions related to vaccine distribution or appointments, please email [email or web address] or call [phone].

## 2020-08-23 NOTE — Telephone Encounter (Signed)
Seen in clinic today, acceptable risk for surgery Hold Plavix 5 days before procedure (can hold for 7 if neurosurgery requires) Stay on low-dose aspirin Restart Plavix when surgery permits

## 2020-08-24 ENCOUNTER — Other Ambulatory Visit: Payer: Self-pay | Admitting: Neurosurgery

## 2020-08-29 NOTE — Progress Notes (Signed)
[id number] [place] [place] - [address] [place] [address] Phone: [phone] Fax: [place], [place] [address] Phone: [phone] Fax: [phone]  [facility] - [address], [place] [address] Phone: [phone] Fax: [facility] - [address] [place], [place] [address] Phone: [phone] Fax: [phone]      Your procedure is scheduled on Tuesday 09/06/2020.  Report to [facility] Main Entrance "A" at 11:00 A.M., and check in at the Admitting office.  Call this number if you have problems the morning of surgery:  [phone]  Call [phone] if you have any questions prior to your surgery date Monday-Friday 8am-4pm    Remember:  Do not eat or drink after midnight the night before your surgery   Take these medicines the morning of surgery with A SIP OF WATER: Atorvastatin (Lipitor)  If NEEDED, you may take the following medications the morning of surgery: Acetaminophen (Tylenol) Nitroglycerin (Nitrostat)  As of today, STOP taking any Aleve, Naproxen, Ibuprofen, Motrin, Advil, Goody's, BC's, all herbal medications, fish oil, and all vitamins.  Follow your surgeon's and Cardiologist's instructions on when to stop Aspirin and Plavix.  If no instructions were given, contact your surgeon's office to get those instructions.                        Do not wear jewelry            Do not wear lotions, powders, colognes, or deodorant.            Do not shave 48 hours prior to surgery.  Men may shave face and neck.            Do not bring valuables to the hospital.            [facility] is not responsible for any belongings or valuables.  Do NOT Smoke (Tobacco/Vaping) or drink Alcohol 24 hours prior to your procedure  If  you use a CPAP at night, you may bring all equipment for your overnight stay.   Contacts, glasses, dentures or bridgework may not be worn into surgery.      For patients admitted to the hospital, discharge time will be determined by your treatment team.   Patients discharged the day of surgery will not be allowed to drive home, and someone needs to stay with them for 24 hours.    Special instructions:   [place]- Preparing For Surgery  Before surgery, you can play an important role. Because skin is not sterile, your skin needs to be as free of germs as possible. You can reduce the number of germs on your skin by washing with CHG (chlorahexidine gluconate) Soap before surgery.  CHG is an antiseptic cleaner which kills germs and bonds with the skin to continue killing germs even after washing.    Oral Hygiene is also important to reduce your risk of infection.  Remember - BRUSH YOUR TEETH THE MORNING OF SURGERY WITH YOUR REGULAR TOOTHPASTE  Please do not use if you have an allergy to CHG or antibacterial soaps. If your skin becomes reddened/irritated stop using the CHG.  Do not shave (including legs and underarms) for at least 48 hours prior to first CHG  shower. It is OK to shave your face.  Please follow these instructions carefully.   1. Shower the NIGHT BEFORE SURGERY and the MORNING OF SURGERY with CHG Soap.   2. If you chose to wash your hair, wash your hair first as usual with your normal shampoo.  3. After you shampoo, rinse your hair and body thoroughly to remove the shampoo.  4. Use CHG as you would any other liquid soap. You can apply CHG directly to the skin and wash gently with a scrungie or a clean washcloth.   5. Apply the CHG Soap to your body ONLY FROM THE NECK DOWN.  Do not use on open wounds or open sores. Avoid contact with your eyes, ears, mouth and genitals (private parts). Wash Face and genitals (private parts)  with your normal soap.   6. Wash thoroughly,  paying special attention to the area where your surgery will be performed.  7. Thoroughly rinse your body with warm water from the neck down.  8. DO NOT shower/wash with your normal soap after using and rinsing off the CHG Soap.  9. Pat yourself dry with a CLEAN TOWEL.  10. Wear CLEAN PAJAMAS to bed the night before surgery  11. Place CLEAN SHEETS on your bed the night of your first shower and DO NOT SLEEP WITH PETS.   Day of Surgery: Shower with CHG soap as directed Wear Clean/Comfortable clothing the morning of surgery Do not apply any deodorants/lotions.   Remember to brush your teeth WITH YOUR REGULAR TOOTHPASTE.   Please read over the following fact sheets that you were given.

## 2020-08-30 ENCOUNTER — Encounter (HOSPITAL_COMMUNITY)
Admission: RE | Admit: 2020-08-30 | Discharge: 2020-08-30 | Disposition: A | Discharge status: Home / Self Care | Payer: Medicare Other | Source: Ambulatory Visit | Attending: Neurosurgery | Admitting: Neurosurgery

## 2020-08-30 ENCOUNTER — Other Ambulatory Visit: Payer: Self-pay

## 2020-08-30 ENCOUNTER — Encounter (HOSPITAL_COMMUNITY): Payer: Self-pay

## 2020-08-30 DIAGNOSIS — Z7902 Long term (current) use of antithrombotics/antiplatelets: Secondary | ICD-10-CM | POA: Insufficient documentation

## 2020-08-30 DIAGNOSIS — Z6831 Body mass index (BMI) 31.0-31.9, adult: Secondary | ICD-10-CM | POA: Insufficient documentation

## 2020-08-30 DIAGNOSIS — Z01818 Encounter for other preprocedural examination: Secondary | ICD-10-CM | POA: Diagnosis present

## 2020-08-30 DIAGNOSIS — I2510 Atherosclerotic heart disease of native coronary artery without angina pectoris: Secondary | ICD-10-CM | POA: Diagnosis not present

## 2020-08-30 DIAGNOSIS — I252 Old myocardial infarction: Secondary | ICD-10-CM | POA: Diagnosis not present

## 2020-08-30 DIAGNOSIS — E669 Obesity, unspecified: Secondary | ICD-10-CM | POA: Diagnosis not present

## 2020-08-30 DIAGNOSIS — I10 Essential (primary) hypertension: Secondary | ICD-10-CM | POA: Diagnosis not present

## 2020-08-30 DIAGNOSIS — E785 Hyperlipidemia, unspecified: Secondary | ICD-10-CM | POA: Insufficient documentation

## 2020-08-30 DIAGNOSIS — K219 Gastro-esophageal reflux disease without esophagitis: Secondary | ICD-10-CM | POA: Diagnosis not present

## 2020-08-30 DIAGNOSIS — Z87891 Personal history of nicotine dependence: Secondary | ICD-10-CM | POA: Diagnosis not present

## 2020-08-30 DIAGNOSIS — Z79899 Other long term (current) drug therapy: Secondary | ICD-10-CM | POA: Insufficient documentation

## 2020-08-30 HISTORY — DX: Other specified postprocedural states: Z98.890

## 2020-08-30 HISTORY — DX: Other specified postprocedural states: R11.2

## 2020-08-30 HISTORY — DX: Nausea with vomiting, unspecified: R11.2

## 2020-08-30 LAB — BASIC METABOLIC PANEL
Anion gap: 9 (ref 5–15)
BUN: 20 mg/dL (ref 8–23)
CO2: 26 mmol/L (ref 22–32)
Calcium: 9.4 mg/dL (ref 8.9–10.3)
Chloride: 104 mmol/L (ref 98–111)
Creatinine, Ser: 1.02 mg/dL (ref 0.61–1.24)
GFR, Estimated: >60 mL/min (ref >60)
Glucose, Bld: 113 mg/dL — ABNORMAL HIGH (ref 70–99)
Potassium: 4.8 mmol/L (ref 3.5–5.1)
Sodium: 139 mmol/L (ref 135–145)

## 2020-08-30 LAB — CBC WITH DIFFERENTIAL/PLATELET
Abs Immature Granulocytes: 0.01 10*3/uL (ref 0.00–0.07)
Basophils Absolute: 0 10*3/uL (ref 0.0–0.1)
Basophils Relative: 1 %
Eosinophils Absolute: 0.2 10*3/uL (ref 0.0–0.5)
Eosinophils Relative: 3 %
HCT: 42.3 % (ref 39.0–52.0)
Hemoglobin: 13.9 g/dL (ref 13.0–17.0)
Immature Granulocytes: 0 %
Lymphocytes Relative: 39 %
Lymphs Abs: 2.4 10*3/uL (ref 0.7–4.0)
MCH: 32.9 pg (ref 26.0–34.0)
MCHC: 32.9 g/dL (ref 30.0–36.0)
MCV: 100 fL (ref 80.0–100.0)
Monocytes Absolute: 0.7 10*3/uL (ref 0.1–1.0)
Monocytes Relative: 12 %
Neutro Abs: 2.8 10*3/uL (ref 1.7–7.7)
Neutrophils Relative %: 45 %
Platelets: 255 10*3/uL (ref 150–400)
RBC: 4.23 MIL/uL (ref 4.22–5.81)
RDW: 12.4 % (ref 11.5–15.5)
WBC: 6.2 10*3/uL (ref 4.0–10.5)
nRBC: 0 % (ref 0.0–0.2)

## 2020-08-30 LAB — SURGICAL PCR SCREEN
MRSA, PCR: NEGATIVE
Staphylococcus aureus: NEGATIVE

## 2020-08-30 NOTE — Progress Notes (Signed)
PCP - Lelon Huh  Cardiologist - Dr. Rockey Situ  Chest x-ray - n/a EKG - 08-30-20 Cardiac Cath - 2007  Sleep Study - No, but scored 5 on SA survey Stop Bang tool survey sent to PCP   Blood Thinner Instructions: Last dose of Plavix was on 08-29-20, per patient.  Instructions given to continue ASA when Plavix is stopped for surgery.  Patient stated understanding.   COVID TEST- Friday 09-01-20   Anesthesia review: yes, heart history Cardiac clearance given by Dr. Rockey Situ on 08-23-20  Patient denies shortness of breath, fever, cough and chest pain at PAT appointment   All instructions explained to the patient, with a verbal understanding of the material. Patient agrees to go over the instructions while at home for a better understanding. Patient also instructed to self quarantine after being tested for COVID-19. The opportunity to ask questions was provided.

## 2020-08-30 NOTE — Progress Notes (Addendum)
[id number] - [place], [place] - [address] [place] [address] Phone: [phone] Fax: [place], [place] [address] Phone: [phone] Fax: [phone]  [facility] - [address], [place] [address] Phone: [phone] Fax: [facility] [place], [place] [address] Phone: [phone] Fax: [phone]      Your procedure is scheduled on Tuesday 09/06/2020.  Report to [facility] Main Entrance "A" at 11:00 A.M., and check in at the Admitting       office.  Call this number if you have problems the morning of surgery:  [phone]  Call [phone] if you have any questions prior to your surgery date Monday-Friday 8am-4pm    Remember:  Do not eat or drink after midnight the night before your surgery   Take these medicines the morning of surgery with A SIP OF WATER:  Atorvastatin (Lipitor)  If NEEDED, you may take the following medications the morning of surgery: Acetaminophen (Tylenol) Nitroglycerin (Nitrostat)  As of today, STOP taking any Aleve, Naproxen, Ibuprofen, Motrin, Advil, Goody's, BC's, all herbal medications, fish oil, and all vitamins.  Follow your surgeon's and Cardiologist's instructions on when to stop Aspirin and Plavix.  If no instructions were given, contact your surgeon's office to get those instructions.                        Do not wear jewelry            Do not wear lotions, powders, colognes, or deodorant.            Men may shave face and neck.            Do not bring valuables to the hospital.            [facility] is not responsible for any belongings or valuables.  Do NOT Smoke (Tobacco/Vaping) or drink Alcohol 24 hours prior to your procedure  If you use a CPAP at night, you may  bring all equipment for your overnight stay.   Contacts, glasses, dentures or bridgework may not be worn into surgery.      For patients admitted to the hospital, discharge time will be determined by your treatment team.   Patients discharged the day of surgery will not be allowed to drive home, and someone needs to stay with them for 24 hours.    Special instructions:   [facility]- Preparing For Surgery  Before surgery, you can play an important role. Because skin is not sterile, your skin needs to be as free of germs as possible. You can reduce the number of germs on your skin by washing with CHG (chlorahexidine gluconate) Soap before surgery.  CHG is an antiseptic cleaner which kills germs and bonds with the skin to continue killing germs even after washing.    Oral Hygiene is also important to reduce your risk of infection.  Remember - BRUSH YOUR TEETH THE MORNING OF SURGERY WITH YOUR REGULAR TOOTHPASTE  Please do not use if you have an allergy to CHG or antibacterial soaps. If your skin becomes reddened/irritated stop using the CHG.  Do not shave (including legs and underarms) for at least 48 hours prior to first CHG shower. It  is OK to shave your face.  Please follow these instructions carefully.   1. Shower the NIGHT BEFORE SURGERY and the MORNING OF SURGERY with CHG Soap.   2. If you chose to wash your hair, wash your hair first as usual with your normal shampoo.  3. After you shampoo, rinse your hair and body thoroughly to remove the shampoo.  4. Use CHG as you would any other liquid soap. You can apply CHG directly to the skin and wash gently with a scrungie or a clean washcloth.   5. Apply the CHG Soap to your body ONLY FROM THE NECK DOWN.  Do not use on open wounds or open sores. Avoid contact with your eyes, ears, mouth and genitals (private parts). Wash Face and genitals (private parts)  with your normal soap.   6. Wash thoroughly, paying special attention to the area  where your surgery will be performed.  7. Thoroughly rinse your body with warm water from the neck down.  8. DO NOT shower/wash with your normal soap after using and rinsing off the CHG Soap.  9. Pat yourself dry with a CLEAN TOWEL.  10. Wear CLEAN PAJAMAS to bed the night before surgery  11. Place CLEAN SHEETS on your bed the night of your first shower and DO NOT SLEEP WITH PETS.   Day of Surgery: Shower with CHG soap as directed Wear Clean/Comfortable clothing the morning of surgery Do not apply any deodorants/lotions.   Remember to brush your teeth WITH YOUR REGULAR TOOTHPASTE.   Please read over the following fact sheets that you were given.

## 2020-08-30 NOTE — Progress Notes (Signed)
Anesthesia Chart Review:  Case: [id number] Date/Time: 09/06/20 1150   Procedure: ACDF - C4-C5 (N/A ) - 3C   Anesthesia type: General   Pre-op diagnosis: Stenosis   Location: [facility] OR ROOM 18 / [facility]   Surgeons: Earnie Larsson, MD      DISCUSSION: Patient is a 64 year old male scheduled for the above procedure.  History includes former smoker (quit 07/12/12), post-operative N/V, CAD (inferoposterior MI, s/p BMS x2 RCA and medical therapy for 70% mLAD 08/15/06), GERD, hyperlipidemia, HTN, colon surgery, neck surgery (C5-7 ACDF 11/05/03), back surgery (left L4-S1 laminotomy/foraminotomy 03/15/06). S/p right knee arthroscopies x2 in 2020.  BMI is consistent with obesity.  Preoperative cardiology input outlined on 08/23/20 by Dr. Rockey Situ: Preop cardiovascular evaluation Acceptable risk for procedure, no further testing needed Hold Plavix 5 days prior to procedure (7 if needed) Take aspirin when not on Plavix in the perioperative. - Patient reported last Plavix 08/29/2020 was instructed to continue aspirin while Plavix on hold.  Preoperative COVID-19 testing is scheduled for 09/02/2020.  Anesthesia team to evaluate on the day of surgery.   VS: BP (!) 145/85   Pulse (!) 58   Temp 36.7 C   Resp 18   Ht 5\' 10"  (1.778 m)   Wt 101 kg   SpO2 96%   BMI 31.94 kg/m     PROVIDERS: Birdie Sons, MD is PCP Ida Rogue, MD is cardiologist   LABS: Labs reviewed: Acceptable for surgery. (all labs ordered are listed, but only abnormal results are displayed)  Labs Reviewed  BASIC METABOLIC PANEL - Abnormal; Notable for the following components:      Result Value   Glucose, Bld 113 (*)    All other components within normal limits  SURGICAL PCR SCREEN  CBC WITH DIFFERENTIAL/PLATELET     IMAGES: MRI C-spine 08/10/20 (Report in [facility]/PACS): IMPRESSION: 1. C3-4 and C4-5 advanced biforaminal impingement due to degenerative spurring and disc narrowing. 2. C5-6 and C6-7 ACDF with solid  arthrodesis and no recurrent impingement.   EKG: 08/30/20: SB at 56 bpm   CV: Echo 08/22/06 (in setting of ACS, s/p RCA stent; [facility]): INTERPRETATION  MILD LV DYSFUNCTION (Estimated EF 45%. Hypocontractile anterior, lateral, septal, apical, inferior, posterior)  VALVULAR REGURGITATION: TRIVIAL MR, TRIVIAL PR, TRIVIAL TR  NO VALVULAR STENOSIS    Cardiac cath 08/15/06 ([facility]): DIAGNOSTIC SUMMARY  Coronary Artery Disease  Left Main:insignificant (20% discrete) LAD system:significant (20% discrete mLAD & 70% discrete mLAD)  LCX system:insignificant (10% discrete pLCX; 20% discrete mLCX & OM2; )  RCA system:total  Number of vessels with significant CAD:2 - Vessel  Left Ventriculogram  Ejection Fraction: 55%  INTERVENTIONAL SUMMARY  Lesions Attempted:1  Lesion #1: Prox RCA 100%(pre) to 10%(post)  COMMENT  RCA lesion: 81F JR4 guide. 0.014 Traverse wire. Predilated with 2.5/15  Voyager. Stented with 3.0/28 Vision at 16 atms. Distal segment overlapped  with 3.0/12 Vision at 18 atms.Postdilated with 3.5/15 Quantum to 3.65mm.    Past Medical History:  Diagnosis Date  . Cholesterol blood lowered   . Complex tear of lateral meniscus of right knee as current injury 03/20/2019  . Complex tear of medial meniscus of right knee as current injury 03/20/2019  . Coronary artery disease   . Elevated lipids   . GERD (gastroesophageal reflux disease)   . History of Barrett's esophagus   . History of rectal bleeding   . Hypertension   . Myocardial infarction ([place]) 11/07   With occlusion of RCA, stents x2  .  PONV (postoperative nausea and vomiting)     Past Surgical History:  Procedure Laterality Date  . APPENDECTOMY    . CARPAL TUNNEL RELEASE Left 09/22/2018   Procedure: CARPAL TUNNEL RELEASE;  Surgeon: Dereck Leep, MD;  Location: [facility] ORS;  Service: Orthopedics;  Laterality: Left;  . CHONDROPLASTY Right 05/11/2019    Procedure: CHONDROPLASTY;  Surgeon: Dereck Leep, MD;  Location: [facility] ORS;  Service: Orthopedics;  Laterality: Right;  . COLON SURGERY     hyperplastic colon polyp removal  . COLONOSCOPY WITH PROPOFOL N/A 11/08/2017   Procedure: COLONOSCOPY WITH PROPOFOL;  Surgeon: Manya Silvas, MD;  Location: [facility] ENDOSCOPY;  Service: Endoscopy;  Laterality: N/A;  . CORONARY STENT PLACEMENT  2007   x2  . ESOPHAGOGASTRODUODENOSCOPY (EGD) WITH PROPOFOL N/A 11/08/2017   Procedure: ESOPHAGOGASTRODUODENOSCOPY (EGD) WITH PROPOFOL;  Surgeon: Manya Silvas, MD;  Location: [facility] ENDOSCOPY;  Service: Endoscopy;  Laterality: N/A;  . GANGLION CYST EXCISION Right   . KNEE ARTHROSCOPY Right 05/11/2019   Procedure: ARTHROSCOPY KNEE- partial medial/lateral menisectomy ;  Surgeon: Dereck Leep, MD;  Location: [facility] ORS;  Service: Orthopedics;  Laterality: Right;  . TONSILLECTOMY AND ADENOIDECTOMY    . UPPER GI ENDOSCOPY  04/23/14   barretts esophagus, repeat 04/2017    MEDICATIONS: . acetaminophen (TYLENOL) 500 MG tablet  . aspirin EC 81 MG tablet  . atorvastatin (LIPITOR) 80 MG tablet  . clopidogrel (PLAVIX) 75 MG tablet  . diphenhydrAMINE (BENADRYL) 50 MG tablet  . esomeprazole (NEXIUM) 20 MG capsule  . lisinopril (ZESTRIL) 10 MG tablet  . meloxicam (MOBIC) 15 MG tablet  . nitroGLYCERIN (NITROSTAT) 0.4 MG SL tablet  . vitamin B-12 (CYANOCOBALAMIN) 1000 MCG tablet   No current facility-administered medications for this encounter.    Myra Gianotti, PA-C Surgical Short Stay/Anesthesiology [facility] Phone [phone] [facility] Phone [phone] 08/31/2020 5:27 PM

## 2020-08-31 NOTE — Anesthesia Preprocedure Evaluation (Addendum)
Anesthesia Evaluation  Patient identified by MRN, date of birth, ID band Patient awake    Reviewed: Allergy & Precautions, NPO status , Patient's Chart, lab work & pertinent test results  History of Anesthesia Complications (+) PONV  Airway Mallampati: II  TM Distance: >3 FB Neck ROM: Full    Dental no notable dental hx.    Pulmonary neg pulmonary ROS, former smoker,    Pulmonary exam normal breath sounds clear to auscultation       Cardiovascular hypertension, + CAD, + Past MI, + Cardiac Stents and + Peripheral Vascular Disease  Normal cardiovascular exam Rhythm:Regular Rate:Normal     Neuro/Psych negative neurological ROS  negative psych ROS   GI/Hepatic negative GI ROS, Neg liver ROS,   Endo/Other  negative endocrine ROS  Renal/GU negative Renal ROS  negative genitourinary   Musculoskeletal negative musculoskeletal ROS (+)   Abdominal   Peds negative pediatric ROS (+)  Hematology negative hematology ROS (+)   Anesthesia Other Findings   Reproductive/Obstetrics negative OB ROS                            Anesthesia Physical Anesthesia Plan  ASA: III  Anesthesia Plan: General   Post-op Pain Management:    Induction: Intravenous  PONV Risk Score and Plan: 3 and Ondansetron and Dexamethasone  Airway Management Planned: Oral ETT  Additional Equipment:   Intra-op Plan:   Post-operative Plan: Extubation in OR  Informed Consent: I have reviewed the patients History and Physical, chart, labs and discussed the procedure including the risks, benefits and alternatives for the proposed anesthesia with the patient or authorized representative who has indicated his/her understanding and acceptance.     Dental advisory given  Plan Discussed with: CRNA and Surgeon  Anesthesia Plan Comments: (PAT note written 08/31/2020 by Myra Gianotti, PA-C. )       Anesthesia Quick  Evaluation

## 2020-09-02 ENCOUNTER — Other Ambulatory Visit
Admission: RE | Admit: 2020-09-02 | Discharge: 2020-09-02 | Disposition: A | Discharge status: Home / Self Care | Payer: Medicare Other | Source: Ambulatory Visit | Attending: Neurosurgery | Admitting: Neurosurgery

## 2020-09-02 ENCOUNTER — Other Ambulatory Visit: Payer: Self-pay

## 2020-09-02 DIAGNOSIS — Z20822 Contact with and (suspected) exposure to covid-19: Secondary | ICD-10-CM | POA: Diagnosis not present

## 2020-09-02 DIAGNOSIS — Z01812 Encounter for preprocedural laboratory examination: Secondary | ICD-10-CM | POA: Diagnosis present

## 2020-09-03 LAB — SARS CORONAVIRUS 2 (TAT 6-24 HRS): SARS Coronavirus 2: NEGATIVE

## 2020-09-06 ENCOUNTER — Other Ambulatory Visit: Payer: Self-pay

## 2020-09-06 ENCOUNTER — Ambulatory Visit (HOSPITAL_COMMUNITY): Payer: Medicare Other

## 2020-09-06 ENCOUNTER — Encounter (HOSPITAL_COMMUNITY): Payer: Self-pay | Admitting: Neurosurgery

## 2020-09-06 ENCOUNTER — Ambulatory Visit (HOSPITAL_COMMUNITY): Payer: Medicare Other | Admitting: Certified Registered Nurse Anesthetist

## 2020-09-06 ENCOUNTER — Ambulatory Visit (HOSPITAL_COMMUNITY): Payer: Medicare Other | Admitting: Vascular Surgery

## 2020-09-06 ENCOUNTER — Observation Stay (HOSPITAL_COMMUNITY)
Admission: RE | Admit: 2020-09-06 | Discharge: 2020-09-06 | Disposition: A | Discharge status: Home / Self Care | Payer: Medicare Other | Attending: Neurosurgery | Admitting: Neurosurgery

## 2020-09-06 ENCOUNTER — Encounter (HOSPITAL_COMMUNITY)
Admission: RE | Disposition: A | Discharge status: Home / Self Care | Payer: Self-pay | Source: Home / Self Care | Attending: Neurosurgery

## 2020-09-06 DIAGNOSIS — Z7902 Long term (current) use of antithrombotics/antiplatelets: Secondary | ICD-10-CM | POA: Insufficient documentation

## 2020-09-06 DIAGNOSIS — I2510 Atherosclerotic heart disease of native coronary artery without angina pectoris: Secondary | ICD-10-CM | POA: Insufficient documentation

## 2020-09-06 DIAGNOSIS — Z7982 Long term (current) use of aspirin: Secondary | ICD-10-CM | POA: Insufficient documentation

## 2020-09-06 DIAGNOSIS — M25512 Pain in left shoulder: Secondary | ICD-10-CM | POA: Diagnosis not present

## 2020-09-06 DIAGNOSIS — I10 Essential (primary) hypertension: Secondary | ICD-10-CM | POA: Diagnosis not present

## 2020-09-06 DIAGNOSIS — M5412 Radiculopathy, cervical region: Principal | ICD-10-CM | POA: Insufficient documentation

## 2020-09-06 DIAGNOSIS — Z79899 Other long term (current) drug therapy: Secondary | ICD-10-CM | POA: Insufficient documentation

## 2020-09-06 DIAGNOSIS — M542 Cervicalgia: Secondary | ICD-10-CM | POA: Diagnosis present

## 2020-09-06 DIAGNOSIS — Z419 Encounter for procedure for purposes other than remedying health state, unspecified: Secondary | ICD-10-CM

## 2020-09-06 HISTORY — PX: ANTERIOR CERVICAL DECOMP/DISCECTOMY FUSION: SHX1161

## 2020-09-06 SURGERY — ANTERIOR CERVICAL DECOMPRESSION/DISCECTOMY FUSION 1 LEVEL
Anesthesia: General | Site: Neck

## 2020-09-06 MED ORDER — HYDROMORPHONE HCL 1 MG/ML IJ SOLN
0.2500 mg | INTRAMUSCULAR | Status: DC | PRN
  Administered 2020-09-06 (×2): 0.5 mg via INTRAVENOUS

## 2020-09-06 MED ORDER — PHENYLEPHRINE HCL-NACL 10-0.9 MG/250ML-% IV SOLN
INTRAVENOUS | Status: DC | PRN
  Administered 2020-09-06: 40 ug/min via INTRAVENOUS

## 2020-09-06 MED ORDER — HYDROCODONE-ACETAMINOPHEN 5-325 MG PO TABS: 1.0000 | ORAL_TABLET | ORAL | 0 refills | Status: DC | PRN

## 2020-09-06 MED ORDER — DIPHENHYDRAMINE HCL 25 MG PO TABS
50.0000 mg | ORAL_TABLET | Freq: Every day | ORAL | Status: DC
  Filled 2020-09-06: qty 2

## 2020-09-06 MED ORDER — PHENYLEPHRINE 40 MCG/ML (10ML) SYRINGE FOR IV PUSH (FOR BLOOD PRESSURE SUPPORT)
PREFILLED_SYRINGE | INTRAVENOUS | Status: DC | PRN
  Administered 2020-09-06 (×2): 40 ug via INTRAVENOUS
  Administered 2020-09-06 (×3): 80 ug via INTRAVENOUS
  Administered 2020-09-06: 40 ug via INTRAVENOUS

## 2020-09-06 MED ORDER — ATORVASTATIN CALCIUM 80 MG PO TABS
80.0000 mg | ORAL_TABLET | Freq: Every day | ORAL | Status: DC
  Administered 2020-09-06: 80 mg via ORAL
  Filled 2020-09-06: qty 1

## 2020-09-06 MED ORDER — SODIUM CHLORIDE 0.9 % IV SOLN: 250.0000 mL | INTRAVENOUS | Status: DC

## 2020-09-06 MED ORDER — CYCLOBENZAPRINE HCL 10 MG PO TABS: 10.0000 mg | ORAL_TABLET | Freq: Three times a day (TID) | ORAL | Status: DC | PRN

## 2020-09-06 MED ORDER — CHLORHEXIDINE GLUCONATE CLOTH 2 % EX PADS: 6.0000 | MEDICATED_PAD | Freq: Once | CUTANEOUS | Status: DC

## 2020-09-06 MED ORDER — DEXAMETHASONE SODIUM PHOSPHATE 10 MG/ML IJ SOLN
INTRAMUSCULAR | Status: DC | PRN
  Administered 2020-09-06: 10 mg via INTRAVENOUS

## 2020-09-06 MED ORDER — CHLORHEXIDINE GLUCONATE 0.12 % MT SOLN
15.0000 mL | Freq: Once | OROMUCOSAL | Status: AC
  Administered 2020-09-06: 15 mL via OROMUCOSAL
  Filled 2020-09-06: qty 15

## 2020-09-06 MED ORDER — MENTHOL 3 MG MT LOZG: 1.0000 | LOZENGE | OROMUCOSAL | Status: DC | PRN

## 2020-09-06 MED ORDER — HYDROMORPHONE HCL 1 MG/ML IJ SOLN: 1.0000 mg | INTRAMUSCULAR | Status: DC | PRN

## 2020-09-06 MED ORDER — PANTOPRAZOLE SODIUM 40 MG PO TBEC
40.0000 mg | DELAYED_RELEASE_TABLET | Freq: Every day | ORAL | Status: DC
  Administered 2020-09-06: 40 mg via ORAL
  Filled 2020-09-06: qty 1

## 2020-09-06 MED ORDER — HYDROCODONE-ACETAMINOPHEN 5-325 MG PO TABS
1.0000 | ORAL_TABLET | ORAL | Status: DC | PRN
  Administered 2020-09-06: 1 via ORAL
  Filled 2020-09-06: qty 1

## 2020-09-06 MED ORDER — ONDANSETRON HCL 4 MG/2ML IJ SOLN
INTRAMUSCULAR | Status: DC | PRN
  Administered 2020-09-06: 4 mg via INTRAVENOUS

## 2020-09-06 MED ORDER — SODIUM CHLORIDE 0.9% FLUSH
3.0000 mL | Freq: Two times a day (BID) | INTRAVENOUS | Status: DC
  Administered 2020-09-06: 3 mL via INTRAVENOUS

## 2020-09-06 MED ORDER — MIDAZOLAM HCL 2 MG/2ML IJ SOLN
INTRAMUSCULAR | Status: AC
  Filled 2020-09-06: qty 2

## 2020-09-06 MED ORDER — NITROGLYCERIN 0.4 MG SL SUBL: 0.4000 mg | SUBLINGUAL_TABLET | SUBLINGUAL | Status: DC | PRN

## 2020-09-06 MED ORDER — HEMOSTATIC AGENTS (NO CHARGE) OPTIME
TOPICAL | Status: DC | PRN
  Administered 2020-09-06: 1 via TOPICAL

## 2020-09-06 MED ORDER — PROPOFOL 10 MG/ML IV BOLUS
INTRAVENOUS | Status: DC | PRN
  Administered 2020-09-06: 150 mg via INTRAVENOUS

## 2020-09-06 MED ORDER — HYDROCODONE-ACETAMINOPHEN 10-325 MG PO TABS: 1.0000 | ORAL_TABLET | ORAL | Status: DC | PRN

## 2020-09-06 MED ORDER — ONDANSETRON HCL 4 MG/2ML IJ SOLN: 4.0000 mg | Freq: Once | INTRAMUSCULAR | Status: DC | PRN

## 2020-09-06 MED ORDER — ORAL CARE MOUTH RINSE: 15.0000 mL | Freq: Once | OROMUCOSAL | Status: AC

## 2020-09-06 MED ORDER — PHENYLEPHRINE 40 MCG/ML (10ML) SYRINGE FOR IV PUSH (FOR BLOOD PRESSURE SUPPORT)
PREFILLED_SYRINGE | INTRAVENOUS | Status: AC
  Filled 2020-09-06: qty 10

## 2020-09-06 MED ORDER — HYDROMORPHONE HCL 1 MG/ML IJ SOLN
INTRAMUSCULAR | Status: AC
  Filled 2020-09-06: qty 1

## 2020-09-06 MED ORDER — CEFAZOLIN SODIUM-DEXTROSE 1-4 GM/50ML-% IV SOLN
1.0000 g | Freq: Three times a day (TID) | INTRAVENOUS | Status: DC
  Administered 2020-09-06: 1 g via INTRAVENOUS
  Filled 2020-09-06: qty 50

## 2020-09-06 MED ORDER — PHENOL 1.4 % MT LIQD: 1.0000 | OROMUCOSAL | Status: DC | PRN

## 2020-09-06 MED ORDER — LISINOPRIL 10 MG PO TABS
10.0000 mg | ORAL_TABLET | Freq: Every day | ORAL | Status: DC
  Administered 2020-09-06: 10 mg via ORAL
  Filled 2020-09-06: qty 1

## 2020-09-06 MED ORDER — EPHEDRINE SULFATE-NACL 50-0.9 MG/10ML-% IV SOSY
PREFILLED_SYRINGE | INTRAVENOUS | Status: DC | PRN
  Administered 2020-09-06: 5 mg via INTRAVENOUS

## 2020-09-06 MED ORDER — LACTATED RINGERS IV SOLN: INTRAVENOUS | Status: DC | PRN

## 2020-09-06 MED ORDER — EPHEDRINE 5 MG/ML INJ
INTRAVENOUS | Status: AC
  Filled 2020-09-06: qty 10

## 2020-09-06 MED ORDER — HYDROCODONE-ACETAMINOPHEN 10-325 MG PO TABS: 2.0000 | ORAL_TABLET | ORAL | Status: DC | PRN

## 2020-09-06 MED ORDER — ROCURONIUM BROMIDE 100 MG/10ML IV SOLN
INTRAVENOUS | Status: DC | PRN
  Administered 2020-09-06: 30 mg via INTRAVENOUS
  Administered 2020-09-06: 70 mg via INTRAVENOUS

## 2020-09-06 MED ORDER — FENTANYL CITRATE (PF) 250 MCG/5ML IJ SOLN
INTRAMUSCULAR | Status: AC
  Filled 2020-09-06: qty 5

## 2020-09-06 MED ORDER — ONDANSETRON HCL 4 MG PO TABS: 4.0000 mg | ORAL_TABLET | Freq: Four times a day (QID) | ORAL | Status: DC | PRN

## 2020-09-06 MED ORDER — 0.9 % SODIUM CHLORIDE (POUR BTL) OPTIME
TOPICAL | Status: DC | PRN
  Administered 2020-09-06: 1000 mL

## 2020-09-06 MED ORDER — THROMBIN 5000 UNITS EX SOLR
CUTANEOUS | Status: AC
  Filled 2020-09-06: qty 10000

## 2020-09-06 MED ORDER — LACTATED RINGERS IV SOLN: INTRAVENOUS | Status: DC

## 2020-09-06 MED ORDER — SODIUM CHLORIDE 0.9% FLUSH: 3.0000 mL | INTRAVENOUS | Status: DC | PRN

## 2020-09-06 MED ORDER — ONDANSETRON HCL 4 MG/2ML IJ SOLN: 4.0000 mg | Freq: Four times a day (QID) | INTRAMUSCULAR | Status: DC | PRN

## 2020-09-06 MED ORDER — CYCLOBENZAPRINE HCL 10 MG PO TABS: 10.0000 mg | ORAL_TABLET | Freq: Three times a day (TID) | ORAL | 0 refills | Status: DC | PRN

## 2020-09-06 MED ORDER — FENTANYL CITRATE (PF) 250 MCG/5ML IJ SOLN
INTRAMUSCULAR | Status: DC | PRN
  Administered 2020-09-06 (×2): 50 ug via INTRAVENOUS
  Administered 2020-09-06: 100 ug via INTRAVENOUS

## 2020-09-06 MED ORDER — ACETAMINOPHEN 650 MG RE SUPP: 650.0000 mg | RECTAL | Status: DC | PRN

## 2020-09-06 MED ORDER — MIDAZOLAM HCL 5 MG/5ML IJ SOLN
INTRAMUSCULAR | Status: DC | PRN
  Administered 2020-09-06 (×2): 1 mg via INTRAVENOUS

## 2020-09-06 MED ORDER — DEXAMETHASONE SODIUM PHOSPHATE 10 MG/ML IJ SOLN
10.0000 mg | Freq: Once | INTRAMUSCULAR | Status: DC
  Filled 2020-09-06: qty 1

## 2020-09-06 MED ORDER — SUGAMMADEX SODIUM 200 MG/2ML IV SOLN
INTRAVENOUS | Status: DC | PRN
  Administered 2020-09-06: 100 mg via INTRAVENOUS
  Administered 2020-09-06: 200 mg via INTRAVENOUS

## 2020-09-06 MED ORDER — LIDOCAINE 2% (20 MG/ML) 5 ML SYRINGE
INTRAMUSCULAR | Status: DC | PRN
  Administered 2020-09-06: 100 mg via INTRAVENOUS

## 2020-09-06 MED ORDER — CEFAZOLIN SODIUM-DEXTROSE 2-4 GM/100ML-% IV SOLN
2.0000 g | INTRAVENOUS | Status: AC
  Administered 2020-09-06: 2 g via INTRAVENOUS
  Filled 2020-09-06: qty 100

## 2020-09-06 MED ORDER — THROMBIN 5000 UNITS EX SOLR
CUTANEOUS | Status: DC | PRN
  Administered 2020-09-06 (×2): 5000 [IU] via TOPICAL

## 2020-09-06 MED ORDER — ACETAMINOPHEN 325 MG PO TABS: 650.0000 mg | ORAL_TABLET | ORAL | Status: DC | PRN

## 2020-09-06 SURGICAL SUPPLY — 66 items
APL SKNCLS STERI-STRIP NONHPOA (GAUZE/BANDAGES/DRESSINGS) ×1
BAG DECANTER FOR FLEXI CONT (MISCELLANEOUS) ×1 IMPLANT
BAND INSRT 18 STRL LF DISP RB (MISCELLANEOUS) ×2
BAND RUBBER #18 3X1/16 STRL (MISCELLANEOUS) ×6 IMPLANT
BENZOIN TINCTURE PRP APPL 2/3 (GAUZE/BANDAGES/DRESSINGS) ×3 IMPLANT
BIT DRILL 13 (BIT) ×1 IMPLANT
BIT DRILL 13MM (BIT) ×1
BUR MATCHSTICK NEURO 3.0 LAGG (BURR) ×3 IMPLANT
CAGE PEEK 6X14X11 (Cage) ×3 IMPLANT
CANISTER SUCT 3000ML PPV (MISCELLANEOUS) ×3 IMPLANT
CARTRIDGE OIL MAESTRO DRILL (MISCELLANEOUS) ×1 IMPLANT
CLOSURE WOUND 1/2 X4 (GAUZE/BANDAGES/DRESSINGS) ×1
COVER WAND RF STERILE (DRAPES) ×1 IMPLANT
DIFFUSER DRILL AIR PNEUMATIC (MISCELLANEOUS) ×3 IMPLANT
DRAPE C-ARM 42X72 X-RAY (DRAPES) ×6 IMPLANT
DRAPE LAPAROTOMY 100X72 PEDS (DRAPES) ×3 IMPLANT
DRAPE MICROSCOPE LEICA (MISCELLANEOUS) ×3 IMPLANT
DURAPREP 6ML APPLICATOR 50/CS (WOUND CARE) ×3 IMPLANT
ELECT COATED BLADE 2.86 ST (ELECTRODE) ×3 IMPLANT
ELECT REM PT RETURN 9FT ADLT (ELECTROSURGICAL) ×3
ELECTRODE REM PT RTRN 9FT ADLT (ELECTROSURGICAL) ×1 IMPLANT
GAUZE 4X4 16PLY RFD (DISPOSABLE) IMPLANT
GAUZE SPONGE 4X4 12PLY STRL (GAUZE/BANDAGES/DRESSINGS) ×3 IMPLANT
GLOVE BIO SURGEON STRL SZ 6.5 (GLOVE) ×1 IMPLANT
GLOVE BIO SURGEONS STRL SZ 6.5 (GLOVE) ×1
GLOVE BIOGEL PI IND STRL 6.5 (GLOVE) IMPLANT
GLOVE BIOGEL PI IND STRL 7.5 (GLOVE) IMPLANT
GLOVE BIOGEL PI IND STRL 8 (GLOVE) IMPLANT
GLOVE BIOGEL PI INDICATOR 6.5 (GLOVE) ×2
GLOVE BIOGEL PI INDICATOR 7.5 (GLOVE) ×2
GLOVE BIOGEL PI INDICATOR 8 (GLOVE) ×4
GLOVE ECLIPSE 7.5 STRL STRAW (GLOVE) ×6 IMPLANT
GLOVE ECLIPSE 9.0 STRL (GLOVE) ×3 IMPLANT
GLOVE EXAM NITRILE XL STR (GLOVE) IMPLANT
GOWN STRL REUS W/ TWL LRG LVL3 (GOWN DISPOSABLE) IMPLANT
GOWN STRL REUS W/ TWL XL LVL3 (GOWN DISPOSABLE) IMPLANT
GOWN STRL REUS W/TWL 2XL LVL3 (GOWN DISPOSABLE) ×2 IMPLANT
GOWN STRL REUS W/TWL LRG LVL3 (GOWN DISPOSABLE) ×3
GOWN STRL REUS W/TWL XL LVL3 (GOWN DISPOSABLE) ×3
HALTER HD/CHIN CERV TRACTION D (MISCELLANEOUS) ×3 IMPLANT
HEMOSTAT POWDER KIT SURGIFOAM (HEMOSTASIS) IMPLANT
KIT BASIN OR (CUSTOM PROCEDURE TRAY) ×3 IMPLANT
KIT TURNOVER KIT B (KITS) ×3 IMPLANT
NDL SPNL 20GX3.5 QUINCKE YW (NEEDLE) ×1 IMPLANT
NEEDLE SPNL 20GX3.5 QUINCKE YW (NEEDLE) ×3 IMPLANT
NS IRRIG 1000ML POUR BTL (IV SOLUTION) ×3 IMPLANT
OIL CARTRIDGE MAESTRO DRILL (MISCELLANEOUS) ×3
PACK LAMINECTOMY NEURO (CUSTOM PROCEDURE TRAY) ×3 IMPLANT
PAD ARMBOARD 7.5X6 YLW CONV (MISCELLANEOUS) ×9 IMPLANT
PLATE VISION ELITE 27.5MM (Plate) ×2 IMPLANT
RASP 3.0MM (RASP) ×2 IMPLANT
SCREW ST 13X4XST VA NS SPNE (Screw) IMPLANT
SCREW ST VAR 4 ATL (Screw) ×12 IMPLANT
SPACER SPNL 11X14X6XPEEK CVD (Cage) IMPLANT
SPCR SPNL 11X14X6XPEEK CVD (Cage) ×1 IMPLANT
SPONGE INTESTINAL PEANUT (DISPOSABLE) ×3 IMPLANT
SPONGE SURGIFOAM ABS GEL SZ50 (HEMOSTASIS) ×3 IMPLANT
STRIP CLOSURE SKIN 1/2X4 (GAUZE/BANDAGES/DRESSINGS) ×2 IMPLANT
SUT VIC AB 3-0 SH 8-18 (SUTURE) ×3 IMPLANT
SUT VIC AB 4-0 RB1 18 (SUTURE) ×3 IMPLANT
TAPE CLOTH 4X10 WHT NS (GAUZE/BANDAGES/DRESSINGS) ×1 IMPLANT
TAPE CLOTH SURG 4X10 WHT LF (GAUZE/BANDAGES/DRESSINGS) ×2 IMPLANT
TOWEL GREEN STERILE (TOWEL DISPOSABLE) ×3 IMPLANT
TOWEL GREEN STERILE FF (TOWEL DISPOSABLE) ×3 IMPLANT
TRAP SPECIMEN MUCUS 40CC (MISCELLANEOUS) ×3 IMPLANT
WATER STERILE IRR 1000ML POUR (IV SOLUTION) ×3 IMPLANT

## 2020-09-06 NOTE — H&P (Signed)
Patrick Barrera is an 64 y.o. male.   Chief Complaint: Neck pain HPI: 64 year old male status post prior C5-6 and C6-7 anterior cervical discectomy and fusion with good results.  Patient presents now with neck and left shoulder pain with some weakness and some sensory loss.  Work-up demonstrates stable and solid fusion at C5-6 and C6-7.  Patient with severe disc degeneration with severe foraminal stenosis on the left at C4-5.  Patient presents now for decompression and fusion at C4-5 in hopes of improving his symptoms.  Past Medical History:  Diagnosis Date  . Cholesterol blood lowered   . Complex tear of lateral meniscus of right knee as current injury 03/20/2019  . Complex tear of medial meniscus of right knee as current injury 03/20/2019  . Coronary artery disease   . Elevated lipids   . GERD (gastroesophageal reflux disease)   . History of Barrett's esophagus   . History of rectal bleeding   . Hypertension   . Myocardial infarction ([place]) 11/07   With occlusion of RCA, stents x2  . PONV (postoperative nausea and vomiting)     Past Surgical History:  Procedure Laterality Date  . APPENDECTOMY    . CARPAL TUNNEL RELEASE Left 09/22/2018   Procedure: CARPAL TUNNEL RELEASE;  Surgeon: Dereck Leep, MD;  Location: [facility] ORS;  Service: Orthopedics;  Laterality: Left;  . CHONDROPLASTY Right 05/11/2019   Procedure: CHONDROPLASTY;  Surgeon: Dereck Leep, MD;  Location: [facility] ORS;  Service: Orthopedics;  Laterality: Right;  . COLON SURGERY     hyperplastic colon polyp removal  . COLONOSCOPY WITH PROPOFOL N/A 11/08/2017   Procedure: COLONOSCOPY WITH PROPOFOL;  Surgeon: Manya Silvas, MD;  Location: [facility] ENDOSCOPY;  Service: Endoscopy;  Laterality: N/A;  . CORONARY STENT PLACEMENT  2007   x2  . ESOPHAGOGASTRODUODENOSCOPY (EGD) WITH PROPOFOL N/A 11/08/2017   Procedure: ESOPHAGOGASTRODUODENOSCOPY (EGD) WITH PROPOFOL;  Surgeon: Manya Silvas, MD;  Location: [facility] ENDOSCOPY;  Service:  Endoscopy;  Laterality: N/A;  . GANGLION CYST EXCISION Right   . KNEE ARTHROSCOPY Right 05/11/2019   Procedure: ARTHROSCOPY KNEE- partial medial/lateral menisectomy ;  Surgeon: Dereck Leep, MD;  Location: [facility] ORS;  Service: Orthopedics;  Laterality: Right;  . TONSILLECTOMY AND ADENOIDECTOMY    . UPPER GI ENDOSCOPY  04/23/14   barretts esophagus, repeat 04/2017    Family History  Problem Relation Age of Onset  . Heart disease Father        Stents placed  . Pulmonary fibrosis Father    Social History:  reports that he quit smoking about 8 years ago. His smoking use included cigarettes and e-cigarettes. He has a 45.00 pack-year smoking history. He has never used smokeless tobacco. He reports current alcohol use of about 1.0 standard drink of alcohol per week. He reports that he does not use drugs.  Allergies:  Allergies  Allergen Reactions  . Codeine Nausea Only  . Ezetimibe-Simvastatin Other (See Comments)    Leg cramps    Medications Prior to Admission  Medication Sig Dispense Refill  . acetaminophen (TYLENOL) 500 MG tablet Take 1,000 mg by mouth every 6 (six) hours as needed for moderate pain or headache.     Marland Kitchen aspirin EC 81 MG tablet Take 1 tablet (81 mg total) by mouth daily. Swallow whole. 90 tablet 3  . atorvastatin (LIPITOR) 80 MG tablet Take 1 tablet (80 mg total) by mouth daily. 90 tablet 3  . clopidogrel (PLAVIX) 75 MG tablet Take 1 tablet (75 mg total)  by mouth daily. 90 tablet 3  . diphenhydrAMINE (BENADRYL) 50 MG tablet Take 50 mg by mouth at bedtime.    Marland Kitchen esomeprazole (NEXIUM) 20 MG capsule Take 20 mg by mouth at bedtime.    Marland Kitchen lisinopril (ZESTRIL) 10 MG tablet Take 1 tablet (10 mg total) by mouth daily. 90 tablet 3  . nitroGLYCERIN (NITROSTAT) 0.4 MG SL tablet DISSOLVE 1 TABLET UNDER TONGUE EVERY 5 MIN AS NEED FOR CHEST PAIN. CALL 911 IF NO RELIEF AFTER 3DOSE (Patient taking differently: Place 0.4 mg under the tongue every 5 (five) minutes as needed for chest pain. CALL  911 IF NO RELIEF AFTER 3DOSE) 25 tablet 3  . meloxicam (MOBIC) 15 MG tablet Take 15 mg by mouth daily. (Patient not taking: Reported on 08/25/2020)    . vitamin B-12 (CYANOCOBALAMIN) 1000 MCG tablet Take 1,000 mcg by mouth at bedtime. (Patient not taking: Reported on 08/25/2020)      No results found for this or any previous visit (from the past 48 hour(s)). No results found.  Pertinent items noted in HPI and remainder of comprehensive ROS otherwise negative.  There were no vitals taken for this visit.  Patient is awake and alert.  He is oriented and appropriate.  Speech is fluent.  Judgment insight are intact.  Cranial nerve function normal bilateral.  Motor examination extremities reveals 4 5 weakness in his left deltoid and biceps muscle group otherwise motor strength intact.  Sensory examination with decrease sensation pinprick and light touch in his left C5 dermatome.  Deep tender versus normal active.  There is no evidence of long track signs.  Gait and posture normal.  Examination head ears eyes nose throat is unremarkable chest and abdomen benign.  Extremities are free of major deformity. Assessment/Plan C45 foraminal stenosis with radiculopathy.  Plan C4-5 anterior cervical discectomy with interbody fusion utilizing interbody cage, local harvested autograft, and anterior plate instrumentation.  Risks and benefits been explained.  Patient wishes to proceed.  Mallie Mussel A Atina Feeley 09/06/2020, 9:31 AM

## 2020-09-06 NOTE — Transfer of Care (Signed)
Immediate Anesthesia Transfer of Care Note  Patient: Patrick Barrera  Procedure(s) Performed: Anterior Cervical Decompression Discectomy Fusion Cervical four-five (N/A Neck)  Patient Location: PACU  Anesthesia Type:General  Level of Consciousness: awake, alert  and patient cooperative  Airway & Oxygen Therapy: Patient Spontanous Breathing and Patient connected to face mask oxygen  Post-op Assessment: Report given to RN and Post -op Vital signs reviewed and stable. Pt  Moving extremities x4.  Post vital signs: Reviewed  Last Vitals:  Vitals Value Taken Time  BP 131/92 09/06/20 1233  Temp    Pulse 66 09/06/20 1240  Resp 22 09/06/20 1240  SpO2 92 % 09/06/20 1240  Vitals shown include unvalidated device data.  Last Pain:  Vitals:   09/06/20 1233  TempSrc:   PainSc: (P) 2          Complications: No complications documented.

## 2020-09-06 NOTE — Discharge Instructions (Addendum)
Wound Care Keep incision covered and dry for two days.  Do not put any creams, lotions, or ointments on incision. Leave steri-strips on Neck.  They will fall off by themselves. Activity Walk each and every day, increasing distance each day. No lifting greater than 5 lbs.  Avoid excessive neck motion. No driving for 2 weeks; may ride as a passenger locally.  Diet Resume your normal diet.  Return to Work Will be discussed at you follow up appointment. Call Your Doctor If Any of These Occur Redness, drainage, or swelling at the wound.  Temperature greater than 101 degrees. Severe pain not relieved by pain medication. Incision starts to come apart. Follow Up Appt Call today for appointment in 1-2 weeks ([phone]) or for problems.  If you have any hardware placed in your spine, you will need an x-ray before your appointment.  RESUME PLAVIX AND ASPRIN ON THURSDAY

## 2020-09-06 NOTE — Anesthesia Postprocedure Evaluation (Signed)
Anesthesia Post Note  Patient: Patrick Barrera  Procedure(s) Performed: Anterior Cervical Decompression Discectomy Fusion Cervical four-five (N/A Neck)     Patient location during evaluation: PACU Anesthesia Type: General Level of consciousness: awake and alert Pain management: pain level controlled Vital Signs Assessment: post-procedure vital signs reviewed and stable Respiratory status: spontaneous breathing, nonlabored ventilation, respiratory function stable and patient connected to nasal cannula oxygen Cardiovascular status: blood pressure returned to baseline and stable Postop Assessment: no apparent nausea or vomiting Anesthetic complications: no   No complications documented.  Last Vitals:  Vitals:   09/06/20 1419 09/06/20 1419  BP: (!) 155/89 (!) 155/89  Pulse: 75 75  Resp: 18 18  Temp: (!) 36.4 C (!) 36.4 C  SpO2:  99%    Last Pain:  Vitals:   09/06/20 1419  TempSrc: Oral  PainSc:                  Annalise Mcdiarmid S

## 2020-09-06 NOTE — Brief Op Note (Signed)
09/06/2020  12:16 PM  PATIENT:  Patrick Barrera  64 y.o. male  PRE-OPERATIVE DIAGNOSIS:  Stenosis  POST-OPERATIVE DIAGNOSIS:  Stenosis  PROCEDURE:  Procedure(s): Anterior Cervical Decompression Discectomy Fusion Cervical four-five (N/A)  SURGEON:  Surgeon(s) and Role:    * Earnie Larsson, MD - Primary  PHYSICIAN ASSISTANT:   ASSISTANTSMearl Latin   ANESTHESIA:   general  EBL:  25 mL   BLOOD ADMINISTERED:none  DRAINS: none   LOCAL MEDICATIONS USED:  NONE  SPECIMEN:  No Specimen  DISPOSITION OF SPECIMEN:  N/A  COUNTS:  YES  TOURNIQUET:  * No tourniquets in log *  DICTATION: .Dragon Dictation  PLAN OF CARE: Admit for overnight observation  PATIENT DISPOSITION:  PACU - hemodynamically stable.   Delay start of Pharmacological VTE agent (>24hrs) due to surgical blood loss or risk of bleeding: yes

## 2020-09-06 NOTE — Op Note (Signed)
Date of procedure: 09/06/2020  Date of dictation: Same  Service: Neurosurgery  Preoperative diagnosis: Left C4-5 spondylosis with radiculopathy  Postoperative diagnosis: Same  Procedure Name: C4-5 anterior cervical discectomy with interbody fusion utilizing interbody cage, local harvested autograft, and anterior plate instrumentation  Removal of C56 instrumentation  Surgeon:Roshawn Lacina A.Delores Edelstein, M.D.  Asst. Surgeon: Reinaldo Meeker, NP  Anesthesia: General  Indication: 64 year old male status post remote C5-C7 anterior cervical discectomy and fusion presents now with worsening neck and left upper extremity pain paresthesias and weakness consistent with a left-sided C5 radiculopathy which is failed conservative management.  Patient presents now for C4-5 anterior cervical decompression and fusion for treatment of severe foraminal stenosis.    Operative note: After induction of anesthesia, patient positioned supine with neck slightly extended and held placed halter traction.  Patient's anterior cervical region prepped draped sterilely.  Incision made overlying C4-5.  Dissection performed on the right.  Retractor placed.  Fluoroscopy used.  Level confirmed.  Previously placed anterior cervical instrumentation from C5-C7 was dissected free.  The upper aspect of the plate above the C6 screws was cut using a titanium cutting drill bit and the screws and the superior aspect of the plate overlying C5 was removed.  Fusion at C5-6 was directly inspected and found to be solid.  Discectomy then performed at C4-5 using various instruments to remove the disc material down to level the posterior annulus.  Microscope was then brought to the field used throughout the remainder of the discectomy.  Remaining aspects of annulus osteophytes removed using high-speed drill down to level of the posterior logical ligament.  Posterior logical was then elevated and resected in piecemeal fashion.  Underlying thecal sac was identified.   Wide central decompression then performed undercutting the bodies of C4 and C5.  Decompression then proceeded into each neural foramina with wide anterior foraminotomies performed on the course exiting C5 nerve roots.  At this point a very thorough decompression been achieved.  There was no evidence of injury to the thecal sac or nerve roots.  Wound is then irrigated with antibiotic solution.  A 6 mm [employer] anatomic peek cage was packed with locally harvested autograft and packed into place.  The cage was recessed slightly from the anterior cortical margin.  27 mm Atlantis anterior cervical plate was then placed over the C4 and C5 level.  This then attached under fluoroscopic guidance using 13 mm variable angle screws to each of both levels.  All 4 screws given final tightening found to be solidly within the bone.  Locking screws engaged both levels.  Final images reveal good position of the cage and the hardware at the proper upper level with normal alignment of the spine.  Wound is then irrigated one final time.  Hemostasis was assured with the bipolar cautery.  Wounds and closed in layers with Vicryl sutures.  Steri-Strips and sterile dressing were applied.  No apparent complications.  Patient tolerated the procedure well and he returned to the recovery room postop.

## 2020-09-06 NOTE — Discharge Summary (Signed)
Physician Discharge Summary  Patient ID: Patrick Barrera MRN: [id number] DOB/AGE: 07-Feb-1956 64 y.o.  Admit date: 09/06/2020 Discharge date: 09/06/2020  Admission Diagnoses:  Discharge Diagnoses:  Active Problems:   Cervical radiculopathy   Discharged Condition: good  Hospital Course: Patient did have hospital where underwent uncomplicated E9-5 anterior cervical decompression and fusion.  Postop Truman Hayward doing very well.  Preoperative neck and upper extremity pain numbness and weakness resolved.  Standing ambulating and voiding without difficulty.  Swallowing well.  Voice strong.  Ready for discharge home.  Consults:   Significant Diagnostic Studies:   Treatments:   Discharge Exam: Blood pressure (!) 155/89, pulse 75, temperature (!) 97.5 F (36.4 C), temperature source Oral, resp. rate 18, height 5\' 10"  (1.778 m), weight 100.7 kg, SpO2 99 %. Awake and alert.  Oriented and appropriate.  Motor and sensory function intact.  Wound clean and dry.  Chest and abdomen benign.  Disposition: Discharge disposition: 01-Home or Self Care        Allergies as of 09/06/2020      Reactions   Codeine Nausea Only   Ezetimibe-simvastatin Other (See Comments)   Leg cramps      Medication List    TAKE these medications   acetaminophen 500 MG tablet Commonly known as: TYLENOL Take 1,000 mg by mouth every 6 (six) hours as needed for moderate pain or headache.   aspirin EC 81 MG tablet Take 1 tablet (81 mg total) by mouth daily. Swallow whole.   atorvastatin 80 MG tablet Commonly known as: LIPITOR Take 1 tablet (80 mg total) by mouth daily.   clopidogrel 75 MG tablet Commonly known as: PLAVIX Take 1 tablet (75 mg total) by mouth daily.   cyclobenzaprine 10 MG tablet Commonly known as: FLEXERIL Take 1 tablet (10 mg total) by mouth 3 (three) times daily as needed for muscle spasms.   diphenhydrAMINE 50 MG tablet Commonly known as: BENADRYL Take 50 mg by mouth at bedtime.    esomeprazole 20 MG capsule Commonly known as: NEXIUM Take 20 mg by mouth at bedtime.   HYDROcodone-acetaminophen 5-325 MG tablet Commonly known as: NORCO/VICODIN Take 1 tablet by mouth every 4 (four) hours as needed for moderate pain ((score 4 to 6)).   lisinopril 10 MG tablet Commonly known as: ZESTRIL Take 1 tablet (10 mg total) by mouth daily.   meloxicam 15 MG tablet Commonly known as: MOBIC Take 15 mg by mouth daily.   nitroGLYCERIN 0.4 MG SL tablet Commonly known as: NITROSTAT DISSOLVE 1 TABLET UNDER TONGUE EVERY 5 MIN AS NEED FOR CHEST PAIN. CALL 911 IF NO RELIEF AFTER 3DOSE What changed:   how much to take  how to take this  when to take this  reasons to take this  additional instructions   vitamin B-12 1000 MCG tablet Commonly known as: CYANOCOBALAMIN Take 1,000 mcg by mouth at bedtime.        Signed: Cooper Render Sriyan Cutting 09/06/2020, 6:19 PM

## 2020-09-06 NOTE — Plan of Care (Signed)
Patient ready for discharge to home

## 2020-09-06 NOTE — Anesthesia Procedure Notes (Signed)
Procedure Name: Intubation Date/Time: 09/06/2020 10:48 AM Performed by: Janene Harvey, CRNA Pre-anesthesia Checklist: Patient identified, Emergency Drugs available, Suction available and Patient being monitored Patient Re-evaluated:Patient Re-evaluated prior to induction Oxygen Delivery Method: Circle system utilized Preoxygenation: Pre-oxygenation with 100% oxygen Induction Type: IV induction Ventilation: Mask ventilation without difficulty and Oral airway inserted - appropriate to patient size Laryngoscope Size: Glidescope and 3 Grade View: Grade I Tube type: Oral Tube size: 7.5 mm Number of attempts: 1 Airway Equipment and Method: Stylet and Oral airway Placement Confirmation: ETT inserted through vocal cords under direct vision,  positive ETCO2 and breath sounds checked- equal and bilateral Secured at: 23 cm Tube secured with: Tape Dental Injury: Teeth and Oropharynx as per pre-operative assessment  Difficulty Due To: Difficulty was anticipated and Difficult Airway- due to reduced neck mobility

## 2020-09-06 NOTE — Evaluation (Signed)
Occupational Therapy Evaluation Patient Details Name: Patrick Barrera MRN: [id number] DOB: 03-12-1956 Today's Date: 09/06/2020    History of Present Illness Pt is a 64 yo male s/p C5-6 ACDF. PMHx: spinal fusion, R knee tear, HTN, MI.   Clinical Impression   Pt PTA: pt living with spouse and reports independence with ADL and mobility. Pt  performing LB dressing with set-upA at feet to start; pt able to perform figure 4 with RLE. Spouse plans to assist and has reacher and sock aid. Pt pulling pants to waist and donning shirt with mod I. Pt ambulatory in room with no AD and fair balance. Pt aware of back precautions, but did requiring cueing to avoid bending. Pt transferring on/off commode with mod I. Pt able to state 3/3 precautions to conclude session.  Neck handout provided and reviewed adls in detail. Pt educated on:  avoid sitting for long periods of time, correct bed positioning for sleeping, correct sequence for bed mobility, avoiding lifting more than 5 pounds and never wash directly over incision. All education is complete and patient indicates understanding. No further OT skilled services required. OT signing off. Thank you for this referral.      Follow Up Recommendations  No OT follow up;Supervision - Intermittent    Equipment Recommendations  None recommended by OT    Recommendations for Other Services       Precautions / Restrictions Precautions Precautions: Cervical Precaution Booklet Issued: Yes (comment) Precaution Comments: discussion with no bending, lifting, twisting. Required Braces or Orthoses: Cervical Brace Cervical Brace: Soft collar;At all times Restrictions Weight Bearing Restrictions: No      Mobility Bed Mobility Overal bed mobility: Needs Assistance Bed Mobility: Rolling;Sidelying to Sit Rolling: Supervision Sidelying to sit: Supervision       General bed mobility comments: use of head rail for assist    Transfers Overall transfer level:  Modified independent Equipment used: None                  Balance Overall balance assessment: Modified Independent                                         ADL either performed or assessed with clinical judgement   ADL Overall ADL's : Modified independent;At baseline                                       General ADL Comments: Pt  performing LB dressing with set-upA at feet to start; pt able to perform figure 4 with RLE. Spouse plans to assist and has reacher and sock aid. Pt pulling pants to waist and donning shirt with mod I. Pt ambulatory in room with no AD and fair balance. Pt aware of back precautions, but did requiring cueing to avoid bending. Pt transferring on/off commode with mod I.     Vision Baseline Vision/History: Wears glasses Wears Glasses: At all times Patient Visual Report: No change from baseline Vision Assessment?: No apparent visual deficits     Perception     Praxis      Pertinent Vitals/Pain Pain Assessment: Faces Faces Pain Scale: No hurt Pain Intervention(s): Monitored during session     Hand Dominance Right   Extremity/Trunk Assessment Upper Extremity Assessment Upper Extremity Assessment: Overall WFL for tasks assessed   Lower  Extremity Assessment Lower Extremity Assessment: Overall WFL for tasks assessed   Cervical / Trunk Assessment Cervical / Trunk Assessment: Other exceptions Cervical / Trunk Exceptions: s/p C-spine precautions   Communication Communication Communication: No difficulties   Cognition Arousal/Alertness: Awake/alert Behavior During Therapy: WFL for tasks assessed/performed Overall Cognitive Status: Within Functional Limits for tasks assessed                                     General Comments  Spouse present    Exercises     Shoulder Instructions      Home Living Family/patient expects to be discharged to:: Private residence Living Arrangements:  Spouse/significant other Available Help at Discharge: Family;Available 24 hours/day Type of Home: House Home Access: Stairs to enter [employer] of Steps: 2 Entrance Stairs-Rails: Can reach both Home Layout: One level     Bathroom Shower/Tub: Occupational psychologist: Handicapped height     Home Equipment: Bedside commode;Shower seat;Toilet riser;Adaptive equipment Adaptive Equipment: Reacher;Sock aid Additional Comments: Spouse has had 4 back surgeries and is very familiar with back precautions      Prior Functioning/Environment Level of Independence: Independent        Comments: Driving        OT Problem List: Decreased activity tolerance      OT Treatment/Interventions:      OT Goals(Current goals can be found in the care plan section) Acute Rehab OT Goals Patient Stated Goal: to go home tonight OT Goal Formulation: All assessment and education complete, DC therapy Potential to Achieve Goals: Good  OT Frequency:     Barriers to D/C:            Co-evaluation              AM-PAC OT "6 Clicks" Daily Activity     Outcome Measure Help from another person eating meals?: None Help from another person taking care of personal grooming?: None Help from another person toileting, which includes using toliet, bedpan, or urinal?: None Help from another person bathing (including washing, rinsing, drying)?: A Little Help from another person to put on and taking off regular upper body clothing?: None Help from another person to put on and taking off regular lower body clothing?: A Little 6 Click Score: 22   End of Session Equipment Utilized During Treatment: Cervical collar Nurse Communication: Mobility status;Other (comment) (ready for d/c.)  Activity Tolerance: Patient tolerated treatment well Patient left: in bed;with call bell/phone within reach;with family/visitor present  OT Visit Diagnosis: Muscle weakness (generalized) (M62.81)                 Time: 2094-7096 OT Time Calculation (min): 25 min Charges:  OT General Charges $OT Visit: 1 Visit OT Evaluation $OT Eval Moderate Complexity: 1 Mod OT Treatments $Self Care/Home Management : 8-22 mins  Patrick Barrera, OTR/L Acute Rehabilitation Services Pager: [phone] Office: [phone]   Patrick Barrera 09/06/2020, 5:40 PM

## 2020-09-07 ENCOUNTER — Encounter (HOSPITAL_COMMUNITY): Payer: Self-pay | Admitting: Neurosurgery

## 2020-09-20 ENCOUNTER — Other Ambulatory Visit: Payer: Self-pay

## 2020-09-20 ENCOUNTER — Ambulatory Visit (INDEPENDENT_AMBULATORY_CARE_PROVIDER_SITE_OTHER): Payer: Medicare Other | Admitting: Family Medicine

## 2020-09-20 ENCOUNTER — Encounter: Payer: Self-pay | Admitting: Family Medicine

## 2020-09-20 VITALS — BP 134/78 | HR 71 | Temp 98.4°F | Resp 16 | Wt 220.0 lb

## 2020-09-20 DIAGNOSIS — Z0000 Encounter for general adult medical examination without abnormal findings: Secondary | ICD-10-CM

## 2020-09-20 DIAGNOSIS — F5101 Primary insomnia: Secondary | ICD-10-CM | POA: Diagnosis not present

## 2020-09-20 DIAGNOSIS — Z125 Encounter for screening for malignant neoplasm of prostate: Secondary | ICD-10-CM

## 2020-09-20 DIAGNOSIS — Z23 Encounter for immunization: Secondary | ICD-10-CM | POA: Diagnosis not present

## 2020-09-20 DIAGNOSIS — J301 Allergic rhinitis due to pollen: Secondary | ICD-10-CM

## 2020-09-20 DIAGNOSIS — I10 Essential (primary) hypertension: Secondary | ICD-10-CM

## 2020-09-20 DIAGNOSIS — Z87891 Personal history of nicotine dependence: Secondary | ICD-10-CM

## 2020-09-20 DIAGNOSIS — E782 Mixed hyperlipidemia: Secondary | ICD-10-CM

## 2020-09-20 MED ORDER — ZOLPIDEM TARTRATE 5 MG PO TABS: 5.0000 mg | ORAL_TABLET | Freq: Every evening | ORAL | 1 refills | Status: DC | PRN

## 2020-09-20 NOTE — Patient Instructions (Addendum)
   The CDC recommends two doses of Shingrix (the shingles vaccine) separated by 2 to 6 months for adults age 66 years and older. I recommend checking with your pharmacy plan regarding coverage for this vaccine.      Screening for lung cancer is recommended for people between 2 and 69 years of age who have smoked the equivalent of 1 pack per day for 30 years. Please call our office at [phone] to schedule a low dose CT lung scan for lung cancer screening.     . Covid-19 vaccines: The Covid vaccines have been given to hundreds of millions of people and found to be very effective and are as safe as any other vaccine.  The [employer] vaccine has been associated with very rare dangerous blood clots, but only in adult women under the age of 35.  The risk of dying from Covid infections is much higher than having a serious reaction to the vaccine.  I strongly recommend getting fully vaccinated against Covid-19.  I recommend that adult women under 60 get fully vaccinated, but the [place] vaccines may be safer for those women than the [employer] vaccine.

## 2020-09-20 NOTE — Progress Notes (Signed)
Medicare Initial Preventative Physical Exam    Patient: Patrick Barrera, Male    DOB: 05-Jan-1956, 64 y.o.   MRN: [id number] Visit Date: 09/20/2020  Today's Provider: Lelon Huh, MD   Chief Complaint  Patient presents with  . Medicare Wellness   Subjective    Medicare Initial Preventative Physical Exam Patrick Barrera is a 64 y.o. male who presents today for his Initial Preventative Physical Exam.   HPI Follow up for Atherosclerosis of native coronary artery of native heart with stable angina pectoris:  The patient was last seen for this 1 year ago. Changes made at last visit include none; continue same medicationa.  He reports good compliance with treatment. He feels that condition is Unchanged. He is not having side effects.   -----------------------------------------------------------------------------------------  Social History   Socioeconomic History  . Marital status: Married    Spouse name: Not on file  . Number of children: 2  . Years of education: Not on file  . Highest education level: Not on file  Occupational History  . Occupation: MACHINE OPERATOR    Employer: [employer]    Comment: Full time  Tobacco Use  . Smoking status: Former Smoker    Packs/day: 1.00    Years: 45.00    Pack years: 45.00    Types: Cigarettes, E-cigarettes    Quit date: 07/12/2012    Years since quitting: 8.1  . Smokeless tobacco: Never Used  . Tobacco comment: changed from cigarettes to Vaping in 2013. Previousl 1.5 ppd since 12  Vaping Use  . Vaping Use: Every day  . Start date: 10/09/2011  . Substances: Nicotine  Substance and Sexual Activity  . Alcohol use: Yes    Alcohol/week: 1.0 standard drink    Types: 1 Standard drinks or equivalent per week    Comment: moderate  . Drug use: No  . Sexual activity: Not on file  Other Topics Concern  . Not on file  Social History Narrative   No regular exercise.   Social Determinants of Health   Financial Resource  Strain: Not on file  Food Insecurity: Not on file  Transportation Needs: Not on file  Physical Activity: Not on file  Stress: Not on file  Social Connections: Not on file  Intimate Partner Violence: Not on file    Past Medical History:  Diagnosis Date  . Cholesterol blood lowered   . Complex tear of lateral meniscus of right knee as current injury 03/20/2019  . Complex tear of medial meniscus of right knee as current injury 03/20/2019  . Coronary artery disease   . Elevated lipids   . GERD (gastroesophageal reflux disease)   . History of Barrett's esophagus   . History of rectal bleeding   . Hypertension   . Myocardial infarction ([place]) 11/07   With occlusion of RCA, stents x2  . PONV (postoperative nausea and vomiting)      Patient Active Problem List   Diagnosis Date Noted  . Cervical radiculopathy 09/06/2020  . Primary osteoarthritis of right knee 03/20/2019  . S/P carpal tunnel release 11/09/2018  . Diastasis of rectus abdominis 02/18/2018  . History of adenomatous polyp of colon 11/12/2017  . Low testosterone in male 01/14/2017  . Barrett esophagus 12/06/2015  . Diverticulosis of colon 12/06/2015  . H/O acute myocardial infarction 12/06/2015  . HTN (hypertension) 09/10/2011  . Hyperlipidemia 09/27/2010  . History of smoking 30 or more pack years 09/27/2010  . Atherosclerosis of native coronary artery of  native heart with stable angina pectoris ([place]) 09/27/2010  . ATHEROSCLEROSIS OF AORTA 09/27/2010  . Peripheral artery disease ([place]) 09/27/2010  . ABDOMINAL BRUIT 09/27/2010  . Allergic rhinitis 11/30/2009  . ED (erectile dysfunction) of organic origin 11/19/2008  . Dyslipidemia 10/08/2005  . Acid reflux 10/08/2004  . Calcification of intervertebral cartilage or disc of cervical region 11/09/2003    Past Surgical History:  Procedure Laterality Date  . ANTERIOR CERVICAL DECOMP/DISCECTOMY FUSION N/A 09/06/2020   Procedure: Anterior Cervical Decompression Discectomy  Fusion Cervical four-five;  Surgeon: Earnie Larsson, MD;  Location: [place];  Service: Neurosurgery;  Laterality: N/A;  . APPENDECTOMY    . CARPAL TUNNEL RELEASE Left 09/22/2018   Procedure: CARPAL TUNNEL RELEASE;  Surgeon: Dereck Leep, MD;  Location: [facility];  Service: Orthopedics;  Laterality: Left;  . CHONDROPLASTY Right 05/11/2019   Procedure: CHONDROPLASTY;  Surgeon: Dereck Leep, MD;  Location: [facility];  Service: Orthopedics;  Laterality: Right;  . COLON SURGERY     hyperplastic colon polyp removal  . COLONOSCOPY WITH PROPOFOL N/A 11/08/2017   Procedure: COLONOSCOPY WITH PROPOFOL;  Surgeon: Manya Silvas, MD;  Location[facility];  Service: Endoscopy;  Laterality: N/A;  . CORONARY STENT PLACEMENT  2007   x2  . ESOPHAGOGASTRODUODENOSCOPY (EGD) WITH PROPOFOL N/A 11/08/2017   Procedure: ESOPHAGOGASTRODUODENOSCOPY (EGD) WITH PROPOFOL;  Surgeon: Manya Silvas, MD;  Location: [facility];  Service: Endoscopy;  Laterality: N/A;  . GANGLION CYST EXCISION Right   . KNEE ARTHROSCOPY Right 05/11/2019   Procedure: ARTHROSCOPY KNEE- partial medial/lateral menisectomy ;  Surgeon: Dereck Leep, MD;  Location: [facility];  Service: Orthopedics;  Laterality: Right;  . TONSILLECTOMY AND ADENOIDECTOMY    . UPPER GI ENDOSCOPY  04/23/14   barretts esophagus, repeat 04/2017    His family history includes Heart disease in his father; Pulmonary fibrosis in his father.   Current Outpatient Medications:  .  acetaminophen (TYLENOL) 500 MG tablet, Take 1,000 mg by mouth every 6 (six) hours as needed for moderate pain or headache. , Disp: , Rfl:  .  aspirin EC 81 MG tablet, Take 1 tablet (81 mg total) by mouth daily. Swallow whole., Disp: 90 tablet, Rfl: 3 .  atorvastatin (LIPITOR) 80 MG tablet, Take 1 tablet (80 mg total) by mouth daily., Disp: 90 tablet, Rfl: 3 .  clopidogrel (PLAVIX) 75 MG tablet, Take 1 tablet (75 mg total) by mouth daily., Disp: 90 tablet, Rfl: 3 .  diphenhydrAMINE (BENADRYL)  50 MG tablet, Take 50 mg by mouth at bedtime., Disp: , Rfl:  .  esomeprazole (NEXIUM) 20 MG capsule, Take 20 mg by mouth at bedtime., Disp: , Rfl:  .  lisinopril (ZESTRIL) 10 MG tablet, Take 1 tablet (10 mg total) by mouth daily., Disp: 90 tablet, Rfl: 3 .  meloxicam (MOBIC) 15 MG tablet, Take 15 mg by mouth daily., Disp: , Rfl:  .  nitroGLYCERIN (NITROSTAT) 0.4 MG SL tablet, DISSOLVE 1 TABLET UNDER TONGUE EVERY 5 MIN AS NEED FOR CHEST PAIN. CALL 911 IF NO RELIEF AFTER 3DOSE (Patient taking differently: Place 0.4 mg under the tongue every 5 (five) minutes as needed for chest pain. CALL 911 IF NO RELIEF AFTER 3DOSE), Disp: 25 tablet, Rfl: 3 .  vitamin B-12 (CYANOCOBALAMIN) 1000 MCG tablet, Take 1,000 mcg by mouth at bedtime., Disp: , Rfl:  .  cyclobenzaprine (FLEXERIL) 10 MG tablet, Take 1 tablet (10 mg total) by mouth 3 (three) times daily as needed for muscle spasms. (Patient not taking: Reported  on 09/20/2020), Disp: 30 tablet, Rfl: 0 .  HYDROcodone-acetaminophen (NORCO/VICODIN) 5-325 MG tablet, Take 1 tablet by mouth every 4 (four) hours as needed for moderate pain ((score 4 to 6)). (Patient not taking: Reported on 09/20/2020), Disp: 30 tablet, Rfl: 0 .  zolpidem (AMBIEN) 5 MG tablet, Take 1 tablet (5 mg total) by mouth at bedtime as needed for sleep., Disp: 15 tablet, Rfl: 1   Patient Care Team: Birdie Sons, MD as PCP - General (Family Medicine) Rockey Situ, Kathlene November, MD as PCP - Cardiology (Cardiology) Minna Merritts, MD as Consulting Physician (Cardiology) Manya Silvas, MD (Inactive) (Gastroenterology)  Review of Systems  Constitutional: Negative for appetite change, chills, fatigue and fever.  HENT: Negative for congestion, ear pain, hearing loss, nosebleeds and trouble swallowing.   Eyes: Negative for pain and visual disturbance.  Respiratory: Negative for cough, chest tightness and shortness of breath.   Cardiovascular: Negative for chest pain, palpitations and leg swelling.   Gastrointestinal: Positive for diarrhea. Negative for abdominal pain, blood in stool, constipation, nausea and vomiting.  Endocrine: Negative for polydipsia, polyphagia and polyuria.  Genitourinary: Negative for dysuria and flank pain.  Musculoskeletal: Positive for neck pain. Negative for arthralgias, back pain, joint swelling, myalgias and neck stiffness.  Skin: Negative for color change, rash and wound.  Neurological: Negative for dizziness, tremors, seizures, speech difficulty, weakness, light-headedness and headaches.  Psychiatric/Behavioral: Negative for behavioral problems, confusion, decreased concentration, dysphoric mood and sleep disturbance. The patient is not nervous/anxious.   All other systems reviewed and are negative.     Objective    Vitals: BP 134/78 (BP Location: Left Arm, Patient Position: Sitting, Cuff Size: Large)   Pulse 71   Temp 98.4 F (36.9 C) (Oral)   Resp 16   Wt 220 lb (99.8 kg)   BMI 31.57 kg/m   Hearing Screening   125Hz  250Hz  500Hz  1000Hz  2000Hz  3000Hz  4000Hz  6000Hz  8000Hz   Right ear:           Left ear:             Visual Acuity Screening   Right eye Left eye Both eyes  Without correction:     With correction: 20/13 20/40 20/13   Comments: Patient did not see all colors  Physical Exam   General Appearance:    Obese male. Alert, cooperative, in no acute distress, appears stated age  Head:    Normocephalic, without obvious abnormality, atraumatic  Eyes:    PERRL, conjunctiva/corneas clear, EOM's intact, fundi    benign, both eyes       Ears:    Normal TM's and external ear canals, both ears  Nose:   Nares normal, septum midline, mucosa normal, no drainage   or sinus tenderness  Throat:   Lips, mucosa, and tongue normal; teeth and gums normal  Neck:   Supple, symmetrical, trachea midline, no adenopathy;       thyroid:  No enlargement/tenderness/nodules; no carotid   bruit or JVD  Back:     Symmetric, no curvature, ROM normal, no CVA  tenderness  Lungs:     Clear to auscultation bilaterally, respirations unlabored  Chest wall:    No tenderness or deformity  Heart:    Normal heart rate. Normal rhythm. No murmurs, rubs, or gallops.  S1 and S2 normal  Abdomen:     Soft, non-tender, bowel sounds active all four quadrants,    no masses, no organomegaly  Genitalia:    deferred  Rectal:    deferred  Extremities:   All extremities are intact. No cyanosis or edema  Pulses:   2+ and symmetric all extremities  Skin:   Skin color, texture, turgor normal, no rashes or lesions  Lymph nodes:   Cervical, supraclavicular, and axillary nodes normal  Neurologic:   CNII-XII intact. Normal strength, sensation and reflexes      throughout     Activities of Daily Living In your present state of health, do you have any difficulty performing the following activities: 09/20/2020 09/06/2020  Hearing? N -  Vision? N -  Difficulty concentrating or making decisions? N -  Walking or climbing stairs? N -  Dressing or bathing? N -  Doing errands, shopping? N N  Some recent data might be hidden    Fall Risk Assessment Fall Risk  09/20/2020 07/06/2019 01/14/2017  Falls in the past year? 0 0 No  Number falls in past yr: 0 0 -  Injury with Fall? 0 0 -  Follow up Falls evaluation completed - -     Depression Screen PHQ 2/9 Scores 09/20/2020 07/06/2019 07/06/2019 02/18/2018  PHQ - 2 Score 0 0 0 0  PHQ- 9 Score 2 0 - -    No flowsheet data found.  No results found for any visits on 09/20/20.  Assessment & Plan      Initial Preventative Physical Exam  Reviewed patient's Family Medical History Reviewed and updated list of patient's medical providers Assessment of cognitive impairment was done Assessed patient's functional ability Established a written schedule for health screening [place] Completed and Reviewed  Exercise Activities and Dietary recommendations Goals   None     Immunization History   Administered Date(s) Administered  . Fluad Quad(high Dose 65+) 07/06/2019, 09/20/2020  . Influenza,inj,Quad PF,6+ Mos 07/14/2018  . Pneumococcal Conjugate-13 07/06/2019  . Pneumococcal Polysaccharide-23 01/14/2017  . Td 02/18/2018  . Tdap 12/05/2007  . Zoster 12/09/2015  . Zoster Recombinat (Shingrix) 07/06/2019    Health Maintenance  Topic Date Due  . COVID-19 Vaccine (1) Never done  . PNA vac Low Risk Adult (2 of 2 - PPSV23) 01/14/2022  . COLONOSCOPY  11/08/2022  . TETANUS/TDAP  02/19/2028  . INFLUENZA VACCINE  Completed  . Hepatitis C Screening  Completed     Discussed health benefits of physical activity, and encouraged him to engage in regular exercise appropriate for his age and condition.   1. Encounter for initial preventive physical examination covered by Medicare   2. Need for influenza vaccination  - Flu Vaccine QUAD High Dose IM (Fluad)  3. Prostate cancer screening  - PSA  4. Primary insomnia Try- zolpidem (AMBIEN) 5 MG tablet; Take 1 tablet (5 mg total) by mouth at bedtime as needed for sleep.  Dispense: 15 tablet; Refill: 1  5. Allergic rhinitis due to pollen, unspecified seasonality   6. Primary hypertension Well controlled.  Continue current medications.   - Comprehensive metabolic panel  7. Mixed hyperlipidemia He is tolerating atorvastatin well with no adverse effects.   - Lipid panel - CBC  8. History of smoking 30 or more pack years Counseled regarding LDCT and AAA screening which he will consider.    No follow-ups on file.     The entirety of the information documented in the History of Present Illness, Review of Systems and Physical Exam were personally obtained by me. Portions of this information were initially documented by the CMA and reviewed by me for thoroughness and accuracy.      Lelon Huh,  MD  [facility] [phone] (phone) [phone] (fax)  [place]

## 2020-09-21 ENCOUNTER — Telehealth: Payer: Self-pay

## 2020-09-21 DIAGNOSIS — Z87891 Personal history of nicotine dependence: Secondary | ICD-10-CM

## 2020-09-21 LAB — CBC
Hematocrit: 41 % (ref 37.5–51.0)
Hemoglobin: 14.1 g/dL (ref 13.0–17.7)
MCH: 33.3 pg — ABNORMAL HIGH (ref 26.6–33.0)
MCHC: 34.4 g/dL (ref 31.5–35.7)
MCV: 97 fL (ref 79–97)
Platelets: 287 10*3/uL (ref 150–450)
RBC: 4.23 x10E6/uL (ref 4.14–5.80)
RDW: 12.1 % (ref 11.6–15.4)
WBC: 8.7 10*3/uL (ref 3.4–10.8)

## 2020-09-21 LAB — COMPREHENSIVE METABOLIC PANEL
ALT: 24 IU/L (ref 0–44)
AST: 18 IU/L (ref 0–40)
Albumin/Globulin Ratio: 1.4 (ref 1.2–2.2)
Albumin: 4.1 g/dL (ref 3.8–4.8)
Alkaline Phosphatase: 124 IU/L — ABNORMAL HIGH (ref 44–121)
BUN/Creatinine Ratio: 14 (ref 10–24)
BUN: 15 mg/dL (ref 8–27)
Bilirubin Total: 0.5 mg/dL (ref 0.0–1.2)
CO2: 21 mmol/L (ref 20–29)
Calcium: 9.5 mg/dL (ref 8.6–10.2)
Chloride: 99 mmol/L (ref 96–106)
Creatinine, Ser: 1.09 mg/dL (ref 0.76–1.27)
GFR calc Af Amer: 81 mL/min/{1.73_m2} (ref >59)
GFR calc non Af Amer: 70 mL/min/{1.73_m2} (ref >59)
Globulin, Total: 3 g/dL (ref 1.5–4.5)
Glucose: 97 mg/dL (ref 65–99)
Potassium: 4.4 mmol/L (ref 3.5–5.2)
Sodium: 137 mmol/L (ref 134–144)
Total Protein: 7.1 g/dL (ref 6.0–8.5)

## 2020-09-21 LAB — LIPID PANEL
Chol/HDL Ratio: 4.5 ratio (ref 0.0–5.0)
Cholesterol, Total: 135 mg/dL (ref 100–199)
HDL: 30 mg/dL — ABNORMAL LOW (ref >39)
LDL Chol Calc (NIH): 77 mg/dL (ref 0–99)
Triglycerides: 163 mg/dL — ABNORMAL HIGH (ref 0–149)
VLDL Cholesterol Cal: 28 mg/dL (ref 5–40)

## 2020-09-21 LAB — PSA: Prostate Specific Ag, Serum: 0.5 ng/mL (ref 0.0–4.0)

## 2020-09-21 NOTE — Telephone Encounter (Signed)
Patient advised. Patient says he would like to proceed with having the abdominal ultrasound to screen for aortic aneurysms if its free. He says he had one done several years ago but that was when he had private insurance.

## 2020-09-21 NOTE — Telephone Encounter (Signed)
-----   Message from Birdie Sons, MD sent at 09/21/2020  8:35 AM EST ----- Labs are all normal. Cholesterol well controlled at 135. PSA. Also, meant to tell patient yesterday that Medicare covers a one-time abdominal ultrasound to screen for aortic aneurysms. I do recommend this and can put an order to schedule if he likes.

## 2020-10-03 ENCOUNTER — Ambulatory Visit
Admission: RE | Admit: 2020-10-03 | Discharge: 2020-10-03 | Disposition: A | Discharge status: Home / Self Care | Payer: Medicare Other | Source: Ambulatory Visit | Attending: Family Medicine | Admitting: Family Medicine

## 2020-10-03 ENCOUNTER — Encounter: Payer: Self-pay | Admitting: Family Medicine

## 2020-10-03 ENCOUNTER — Other Ambulatory Visit: Payer: Self-pay

## 2020-10-03 DIAGNOSIS — Z87891 Personal history of nicotine dependence: Secondary | ICD-10-CM | POA: Diagnosis not present

## 2020-10-03 DIAGNOSIS — I719 Aortic aneurysm of unspecified site, without rupture: Secondary | ICD-10-CM | POA: Insufficient documentation

## 2021-01-10 ENCOUNTER — Other Ambulatory Visit: Payer: Self-pay | Admitting: Cardiovascular Disease

## 2021-01-10 ENCOUNTER — Other Ambulatory Visit: Payer: Self-pay | Admitting: *Deleted

## 2021-01-10 MED ORDER — NITROGLYCERIN 0.4 MG SL SUBL: SUBLINGUAL_TABLET | SUBLINGUAL | 0 refills | Status: AC

## 2021-01-10 MED ORDER — ATORVASTATIN CALCIUM 80 MG PO TABS
80.0000 mg | ORAL_TABLET | Freq: Every day | ORAL | 3 refills | Status: DC
Start: 2021-01-10 — End: 2021-08-23

## 2021-01-10 MED ORDER — CLOPIDOGREL BISULFATE 75 MG PO TABS
75.0000 mg | ORAL_TABLET | Freq: Every day | ORAL | 3 refills | Status: DC
Start: 2021-01-10 — End: 2021-08-23

## 2021-01-11 ENCOUNTER — Telehealth: Payer: Self-pay | Admitting: Cardiovascular Disease

## 2021-01-11 NOTE — Telephone Encounter (Signed)
Was able to return pt's call, advised that his metoprolol refill was not sent in for refills since at his last visit with Dr. Rockey Situ on 08/23/21 it was removed from his medication list d/t "Patient not taking: Reported on 08/23/2020". Medications were reviewed in clinic at that office visit and Dr. Rockey Situ did not address adding medication back to his list or if he needed to continue taking the medication at that time. Per Dr. Donivan Scull notes "Essential hypertension - Plan: EKG 12-Lead Blood pressure is well controlled on today's visit. No changes made to the medications. Stable"  Pt reports he still had been getting the medication refilled since it was still on his script at his pharmacy and had been taking it, reports "I do not remember telling anyone I was not taking it". He is now out of refills so pharmacy is not able to refill w/o MD approval. Advised could send Dr. Rockey Situ a message requesting for metropolol to be added back to med list and for refills. However, pt reports that he will reach out to PCP Dr. Caryn Section to see if he will fill medication, if not then he will call back to have a message sent to Dr. Rockey Situ for refills.   Noted Metoprolol 50 mg daily on med list 07/20/2019

## 2021-01-11 NOTE — Telephone Encounter (Signed)
Pt c/o medication issue:  1. Name of Medication: Metoprolol  2. How are you currently taking this medication (dosage and times per day)? 50 mg daily  3. Are you having a reaction (difficulty breathing--STAT)?   4. What is your medication issue? Patient went to refill medication and pharmacy request was denied. Patient unsure if he should be continuing this medication or if it has been discontinued  Please advise

## 2021-08-23 ENCOUNTER — Encounter: Payer: Self-pay | Admitting: Cardiovascular Disease

## 2021-08-23 ENCOUNTER — Ambulatory Visit (INDEPENDENT_AMBULATORY_CARE_PROVIDER_SITE_OTHER): Payer: Medicare Other | Admitting: Cardiovascular Disease

## 2021-08-23 ENCOUNTER — Other Ambulatory Visit: Payer: Self-pay

## 2021-08-23 VITALS — BP 140/85 | HR 75 | Ht 69.5 in | Wt 238.5 lb

## 2021-08-23 DIAGNOSIS — I2510 Atherosclerotic heart disease of native coronary artery without angina pectoris: Secondary | ICD-10-CM | POA: Diagnosis not present

## 2021-08-23 DIAGNOSIS — I739 Peripheral vascular disease, unspecified: Secondary | ICD-10-CM

## 2021-08-23 DIAGNOSIS — I25118 Atherosclerotic heart disease of native coronary artery with other forms of angina pectoris: Secondary | ICD-10-CM | POA: Diagnosis not present

## 2021-08-23 DIAGNOSIS — I10 Essential (primary) hypertension: Secondary | ICD-10-CM

## 2021-08-23 DIAGNOSIS — I208 Other forms of angina pectoris: Secondary | ICD-10-CM

## 2021-08-23 DIAGNOSIS — E782 Mixed hyperlipidemia: Secondary | ICD-10-CM

## 2021-08-23 DIAGNOSIS — I2089 Other forms of angina pectoris: Secondary | ICD-10-CM

## 2021-08-23 MED ORDER — EZETIMIBE 10 MG PO TABS: 10.0000 mg | ORAL_TABLET | Freq: Every day | ORAL | 3 refills | Status: DC

## 2021-08-23 MED ORDER — ATORVASTATIN CALCIUM 80 MG PO TABS
80.0000 mg | ORAL_TABLET | Freq: Every day | ORAL | 3 refills | Status: DC
Start: 2021-08-23 — End: 2022-07-16

## 2021-08-23 MED ORDER — LISINOPRIL 10 MG PO TABS
10.0000 mg | ORAL_TABLET | Freq: Every day | ORAL | 3 refills | Status: DC
Start: 2021-08-23 — End: 2022-07-16

## 2021-08-23 MED ORDER — CLOPIDOGREL BISULFATE 75 MG PO TABS
75.0000 mg | ORAL_TABLET | Freq: Every day | ORAL | 3 refills | Status: DC
Start: 2021-08-23 — End: 2022-07-16

## 2021-08-23 NOTE — Patient Instructions (Signed)
Medication Instructions:  Please START Zetia 10 mg daily Goal LDL <60  If you need a refill on your cardiac medications before your next appointment, please call your pharmacy.    Lab work: No new labs needed  Testing/Procedures: Tax inspector (stress test)  [place] Veterinary surgeon)  Your caregiver has ordered a Stress Test with nuclear imaging. The purpose of this test is to evaluate the blood supply to your heart muscle. This procedure is referred to as a "Non-Invasive Stress Test." This is because other than having an IV started in your vein, nothing is inserted or "invades" your body. Cardiac stress tests are done to find areas of poor blood flow to the heart by determining the extent of coronary artery disease (CAD). Some patients exercise on a treadmill, which naturally increases the blood flow to your heart, while others who are  unable to walk on a treadmill due to physical limitations have a pharmacologic/chemical stress agent called Lexiscan . This medicine will mimic walking on a treadmill by temporarily increasing your coronary blood flow.   Please note: these test may take anywhere between 2-4 hours to complete  PLEASE REPORT TO [place] AT THE FIRST DESK WILL DIRECT YOU WHERE TO GO  Instructions regarding medication:  May take all your regular medication  How to prepare for your Myoview test:  Do not eat or drink after midnight No caffeine for 24 hours prior to test No smoking 24 hours prior to test. ALL your medication may be taken with a few sips of water.   Please wear a short sleeve shirt. Comfortable pants are appropriate. No perfume, cologne or lotion.  PLEASE NOTIFY THE OFFICE AT LEAST 24 HOURS IN ADVANCE IF YOU ARE UNABLE TO KEEP YOUR APPOINTMENT.  [phone] AND  PLEASE NOTIFY NUCLEAR MEDICINE AT [facility] AT LEAST 24 HOURS IN ADVANCE IF YOU ARE UNABLE TO KEEP YOUR APPOINTMENT. [phone]  Follow-Up: At [facility], you and  your health needs are our priority.  As part of our continuing mission to provide you with exceptional heart care, we have created designated Provider Care Teams.  These Care Teams include your primary Cardiologist (physician) and Advanced Practice Providers (APPs -  Physician Assistants and Nurse Practitioners) who all work together to provide you with the care you need, when you need it.  You will need a follow up appointment in 12 months  Providers on your designated Care Team:   Murray Hodgkins, NP Christell Faith, PA-C Marrianne Mood, PA-C Cadence Kathlen Mody, [place]  Any Other Special Instructions Will Be Listed Below (If Applicable).  COVID-19 Vaccine Information can be found at: [email or web address] For questions related to vaccine distribution or appointments, please email [email or web address] or call [phone].

## 2021-08-23 NOTE — Progress Notes (Signed)
Patient ID: Patrick Barrera, male   DOB: Jan 20, 1956, 65 y.o.   MRN: [id number]   Cardiology Office Note  Date:  08/23/2021   ID:  Patrick Barrera, [place] 1955-10-12, MRN [id number]  PCP:  Birdie Sons, MD   Chief Complaint  Patient presents with   12 month follow up     Patient c/o shortness of breath at times. Medications reviewed by the patient verbally.     HPI:  Patrick Barrera is a 65 year old gentleman with  long smoking history who stopped in October 2013,  COPD HTN,  coronary artery disease,  occlusion of his RCA in November 2007 with 2 bare-metal stents placed at that time also with 70% mid LAD lesion that was not intervened upon,  hyperlipidemia  who presents for routine followup of his coronary artery disease  Last office visit November 2021 Has completed c4-5 anterior cervical fusion With Dr. Annette Stable Back on his Plavix,  aspirin  No regular exercise program He does have increasing shortness of breath on exertion, some chest tightness if he walks too far Exercise limited by chronic knee pain  He has 73 year old mother, lives alone  EKG personally reviewed by myself on todays visit Shows normal sinus rhythm rate 75 bpm no significant ST-T wave changes  Lab work reviewed with him Lab Results  Component Value Date   CHOL 135 09/20/2020   HDL 30 (L) 09/20/2020   LDLCALC 77 09/20/2020   TRIG 163 (H) 09/20/2020    PMH:   has a past medical history of Cholesterol blood lowered, Complex tear of lateral meniscus of right knee as current injury (03/20/2019), Complex tear of medial meniscus of right knee as current injury (03/20/2019), Coronary artery disease, Elevated lipids, GERD (gastroesophageal reflux disease), History of Barrett's esophagus, History of rectal bleeding, Hypertension, Myocardial infarction ([place]) (11/07), and PONV (postoperative nausea and vomiting).  PSH:    Past Surgical History:  Procedure Laterality Date   ANTERIOR CERVICAL DECOMP/DISCECTOMY FUSION N/A  09/06/2020   Procedure: Anterior Cervical Decompression Discectomy Fusion Cervical four-five;  Surgeon: Earnie Larsson, MD;  Location: [facility];  Service: Neurosurgery;  Laterality: N/A;   APPENDECTOMY     CARPAL TUNNEL RELEASE Left 09/22/2018   Procedure: CARPAL TUNNEL RELEASE;  Surgeon: Dereck Leep, MD;  Location: [facility];  Service: Orthopedics;  Laterality: Left;   CHONDROPLASTY Right 05/11/2019   Procedure: CHONDROPLASTY;  Surgeon: Dereck Leep, MD;  Location: [facility];  Service: Orthopedics;  Laterality: Right;   COLON SURGERY     hyperplastic colon polyp removal   COLONOSCOPY WITH PROPOFOL N/A 11/08/2017   Procedure: COLONOSCOPY WITH PROPOFOL;  Surgeon: Manya Silvas, MD;  Location: [facility];  Service: Endoscopy;  Laterality: N/A;   CORONARY STENT PLACEMENT  2007   x2   ESOPHAGOGASTRODUODENOSCOPY (EGD) WITH PROPOFOL N/A 11/08/2017   Procedure: ESOPHAGOGASTRODUODENOSCOPY (EGD) WITH PROPOFOL;  Surgeon: Manya Silvas, MD;  Location: [facility];  Service: Endoscopy;  Laterality: N/A;   GANGLION CYST EXCISION Right    KNEE ARTHROSCOPY Right 05/11/2019   Procedure: ARTHROSCOPY KNEE- partial medial/lateral menisectomy ;  Surgeon: Dereck Leep, MD;  Location: [facility];  Service: Orthopedics;  Laterality: Right;   TONSILLECTOMY AND ADENOIDECTOMY     UPPER GI ENDOSCOPY  04/23/14   barretts esophagus, repeat 04/2017    Current Outpatient Medications  Medication Sig Dispense Refill   acetaminophen (TYLENOL) 500 MG tablet Take 1,000 mg by mouth every 6 (six) hours as needed for moderate pain or headache.  aspirin EC 81 MG tablet Take 1 tablet (81 mg total) by mouth daily. Swallow whole. 90 tablet 3   diphenhydrAMINE (BENADRYL) 50 MG tablet Take 50 mg by mouth at bedtime.     esomeprazole (NEXIUM) 20 MG capsule Take 20 mg by mouth at bedtime.     nitroGLYCERIN (NITROSTAT) 0.4 MG SL tablet DISSOLVE 1 TABLET UNDER TONGUE EVERY 5 MIN AS NEED FOR CHEST PAIN. CALL 911 IF NO RELIEF  AFTER 3DOSE 25 tablet 0   atorvastatin (LIPITOR) 80 MG tablet Take 1 tablet (80 mg total) by mouth daily. 90 tablet 3   clopidogrel (PLAVIX) 75 MG tablet Take 1 tablet (75 mg total) by mouth daily. 90 tablet 3   cyclobenzaprine (FLEXERIL) 10 MG tablet Take 1 tablet (10 mg total) by mouth 3 (three) times daily as needed for muscle spasms. (Patient not taking: No sig reported) 30 tablet 0   HYDROcodone-acetaminophen (NORCO/VICODIN) 5-325 MG tablet Take 1 tablet by mouth every 4 (four) hours as needed for moderate pain ((score 4 to 6)). (Patient not taking: No sig reported) 30 tablet 0   lisinopril (ZESTRIL) 10 MG tablet Take 1 tablet (10 mg total) by mouth daily. 90 tablet 3   meloxicam (MOBIC) 15 MG tablet Take 15 mg by mouth daily. (Patient not taking: Reported on 08/23/2021)     vitamin B-12 (CYANOCOBALAMIN) 1000 MCG tablet Take 1,000 mcg by mouth at bedtime. (Patient not taking: Reported on 08/23/2021)     zolpidem (AMBIEN) 5 MG tablet Take 1 tablet (5 mg total) by mouth at bedtime as needed for sleep. (Patient not taking: Reported on 08/23/2021) 15 tablet 1   No current facility-administered medications for this visit.     Allergies:   Codeine and Simvastatin   Social History:  The patient  reports that he quit smoking about 9 years ago. His smoking use included cigarettes and e-cigarettes. He has a 45.00 pack-year smoking history. He has never used smokeless tobacco. He reports current alcohol use of about 1.0 standard drink per week. He reports that he does not use drugs.   Family History:   family history includes Heart disease in his father; Pulmonary fibrosis in his father.    Review of Systems: Review of Systems  Constitutional: Negative.   Respiratory: Negative.    Cardiovascular: Negative.   Gastrointestinal: Negative.   Musculoskeletal: Negative.   Neurological: Negative.   Psychiatric/Behavioral: Negative.    All other systems reviewed and are negative.  PHYSICAL  EXAM: VS:  BP (!) 150/90 (BP Location: Left Arm, Patient Position: Sitting, Cuff Size: Normal)   Pulse 75   Ht 5' 9.5" (1.765 m)   Wt 238 lb 8 oz (108.2 kg)   SpO2 98%   BMI 34.72 kg/m  , BMI Body mass index is 34.72 kg/m. Constitutional:  oriented to person, place, and time. No distress.  Please HENT:  Head: Grossly normal Eyes:  no discharge. No scleral icterus.  Neck: No JVD, no carotid bruits  Cardiovascular: Regular rate and rhythm, no murmurs appreciated Pulmonary/Chest: Clear to auscultation bilaterally, no wheezes or rails Abdominal: Soft.  no distension.  no tenderness.  Musculoskeletal: Normal range of motion Neurological:  normal muscle tone. Coordination normal. No atrophy Skin: Skin warm and dry Psychiatric: normal affect, pleasant   Recent Labs: 09/20/2020: ALT 24; BUN 15; Creatinine, Ser 1.09; Hemoglobin 14.1; Platelets 287; Potassium 4.4; Sodium 137    Lipid Panel Lab Results  Component Value Date   CHOL 135 09/20/2020   HDL  30 (L) 09/20/2020   LDLCALC 77 09/20/2020   TRIG 163 (H) 09/20/2020      Wt Readings from Last 3 Encounters:  08/23/21 238 lb 8 oz (108.2 kg)  09/20/20 220 lb (99.8 kg)  09/06/20 222 lb (100.7 kg)      ASSESSMENT AND PLAN:   Atherosclerosis of native coronary artery of native heart without angina pectoris -  Some tightness with walking, SOB on exertion Limited secondary to knee pain, arthritis Will order lexiscan myoview  Essential hypertension - Plan: EKG 12-Lead Blood pressure is well controlled on today's visit. No changes made to the medications.  SMOKER Stop smoking several years ago, 2013 mild COPD stable  Hyperlipidemia Starting zetia 10 mg daily Stay on statin Goal LDL <60   Total encounter time more than 25 minutes  Greater than 50% was spent in counseling and coordination of care with the patient    Orders Placed This Encounter  Procedures   EKG 12-Lead     Signed, Esmond Plants, M.D.,  Ph.D. 08/23/2021  [place], [place]

## 2021-08-29 ENCOUNTER — Other Ambulatory Visit: Payer: Self-pay

## 2021-08-29 ENCOUNTER — Ambulatory Visit
Admission: RE | Admit: 2021-08-29 | Discharge: 2021-08-29 | Disposition: A | Discharge status: Home / Self Care | Payer: Medicare Other | Source: Ambulatory Visit | Attending: Cardiovascular Disease | Admitting: Cardiovascular Disease

## 2021-08-29 DIAGNOSIS — I2510 Atherosclerotic heart disease of native coronary artery without angina pectoris: Secondary | ICD-10-CM

## 2021-08-29 DIAGNOSIS — I208 Other forms of angina pectoris: Secondary | ICD-10-CM | POA: Insufficient documentation

## 2021-08-29 DIAGNOSIS — I25118 Atherosclerotic heart disease of native coronary artery with other forms of angina pectoris: Secondary | ICD-10-CM | POA: Insufficient documentation

## 2021-08-29 MED ORDER — TECHNETIUM TC 99M TETROFOSMIN IV KIT
30.5900 | PACK | Freq: Once | INTRAVENOUS | Status: AC | PRN
  Administered 2021-08-29: 30.59 via INTRAVENOUS

## 2021-08-29 MED ORDER — TECHNETIUM TC 99M TETROFOSMIN IV KIT
10.5700 | PACK | Freq: Once | INTRAVENOUS | Status: AC | PRN
  Administered 2021-08-29: 10.57 via INTRAVENOUS

## 2021-08-29 MED ORDER — REGADENOSON 0.4 MG/5ML IV SOLN
0.4000 mg | Freq: Once | INTRAVENOUS | Status: AC
  Administered 2021-08-29: 0.4 mg via INTRAVENOUS

## 2021-08-29 MED ORDER — TECHNETIUM TC 99M TETROFOSMIN IV KIT: 31.0600 | PACK | Freq: Once | INTRAVENOUS | Status: DC | PRN

## 2021-08-31 LAB — NM MYOCAR MULTI W/SPECT W/WALL MOTION / EF
LV dias vol: 70 mL (ref 62–150)
LV sys vol: 23 mL
Nuc Stress EF: 67 %
Peak HR: 96 {beats}/min
Rest HR: 73 {beats}/min
Rest Nuclear Isotope Dose: 10.6 mCi
SDS: 5
SRS: 1
SSS: 6
ST Depression (mm): 0 mm
Stress Nuclear Isotope Dose: 30.6 mCi
TID: 0.95

## 2021-09-04 ENCOUNTER — Telehealth: Payer: Self-pay | Admitting: Cardiovascular Disease

## 2021-09-04 DIAGNOSIS — R222 Localized swelling, mass and lump, trunk: Secondary | ICD-10-CM

## 2021-09-04 DIAGNOSIS — R6889 Other general symptoms and signs: Secondary | ICD-10-CM

## 2021-09-04 DIAGNOSIS — R9389 Abnormal findings on diagnostic imaging of other specified body structures: Secondary | ICD-10-CM

## 2021-09-04 NOTE — Telephone Encounter (Signed)
Patient calling States that mychart message states he needs a Chest CT scan Please call to discuss/schedule

## 2021-09-04 NOTE — Telephone Encounter (Signed)
Pt c/o medication issue:  1. Name of Medication: metoprolol   2. How are you currently taking this medication (dosage and times per day)? Not sure   3. Are you having a reaction (difficulty breathing--STAT)? No   4. What is your medication issue? Patient wife thought previously advised to dc ( not at recent visit but previous one) patient did not receive a pharmacy refill and wants to  check

## 2021-09-04 NOTE — Telephone Encounter (Signed)
Able to reach pt regarding his recent Lexiscan (stress test) Dr. Rockey Situ had a chance to review his results and advised   "Stress test no significant ischemia noted  Normal ejection fraction  Low risk study  Of note a nodule was appreciated on the scan with recommendation for chest CT scan, noncontrast  We can order the noncontrast chest CT if he would like "  Patrick Barrera very thankful for the phone call of his results, wants to proceed with the CT chest for further evaluation of nodule.Advise to call [facility] [phone] to schedule at your earliest convince. Pt verbalized understanding/  Patrick Barrera also reports needs a refill on metoprolol succinate 50 mg daily. Advised that this medication was d/c in Nov 2021 and was not discuss at last OV with Dr. Rockey Situ or reviewed during check-in with CMA. Pt reports has been getting refills through out the year, but unable to at this time. Currently out, advise cannot send in any refills d/t old order from 2021. Will need Dr. Rockey Situ to review as this was not on med list at last visit 11/16. Patrick Barrera verbalized understanding and thankful for the return call, otherwise all questions were address with nothing further at this time. Will see at next schedule f/u appt.

## 2021-09-06 ENCOUNTER — Ambulatory Visit
Admission: RE | Admit: 2021-09-06 | Discharge: 2021-09-06 | Disposition: A | Discharge status: Home / Self Care | Payer: Medicare Other | Source: Ambulatory Visit | Attending: Cardiovascular Disease | Admitting: Cardiovascular Disease

## 2021-09-06 ENCOUNTER — Other Ambulatory Visit: Payer: Self-pay

## 2021-09-06 DIAGNOSIS — R222 Localized swelling, mass and lump, trunk: Secondary | ICD-10-CM | POA: Diagnosis present

## 2021-09-06 DIAGNOSIS — R9389 Abnormal findings on diagnostic imaging of other specified body structures: Secondary | ICD-10-CM | POA: Insufficient documentation

## 2021-09-06 DIAGNOSIS — R6889 Other general symptoms and signs: Secondary | ICD-10-CM | POA: Insufficient documentation

## 2021-09-06 MED ORDER — METOPROLOL SUCCINATE ER 25 MG PO TB24: 25.0000 mg | ORAL_TABLET | Freq: Every day | ORAL | 3 refills | Status: DC

## 2021-09-06 NOTE — Telephone Encounter (Signed)
Was able to reach back out to Patrick Barrera to f/u on last conversation of metoprolol, advise Dr. Rockey Situ advise  Looks like he was not taking it November 2021 when checked in for office visit  Perhaps heart rate was lower blood pressure was low, unclear  He is welcome to retry metoprolol succinate 25 daily, down from 50 daily  Monitor blood pressure and heart rate  Thx  TG   Patrick Barrera is okay with 25 mg metoprolol, wants to have his yearly physical in Jan, if BP elevated at that time, he will pick up metoprolol 25 mg daily. Advised will send in to pharmacy and when ready for medication, call pharmacy to fill. Patrick Barrera agree and verbalized understanding.  [name] thankful for follow-up call, will call back with anything further.

## 2021-09-12 ENCOUNTER — Telehealth: Payer: Self-pay | Admitting: Cardiovascular Disease

## 2021-09-12 NOTE — Telephone Encounter (Signed)
   [facility] Pre-operative Risk Assessment    Patient Name: Patrick Barrera  DOB: 03-Dec-1955 MRN: [id number]  HEARTCARE STAFF:  - IMPORTANT!!!!!! Under Visit Info/Reason for Call, type in Other and utilize the format Clearance MM/DD/YY or Clearance TBD. Do not use dashes or single digits. - Please review there is not already an duplicate clearance open for this procedure. - If request is for dental extraction, please clarify the # of teeth to be extracted. - If the patient is currently at the dentist's office, call Pre-Op Callback Staff (MA/nurse) to input urgent request.  - If the patient is not currently in the dentist office, please route to the Pre-Op pool.  Request for surgical clearance:  What type of surgery is being performed? Endoscopy   When is this surgery scheduled? 11/20/20  What type of clearance is required (medical clearance vs. Pharmacy clearance to hold med vs. Both)? both  Are there any medications that need to be held prior to surgery and how long? Plavix instructions  Practice name and name of physician performing surgery? [facility]   What is the office phone number? [phone]   7.   What is the office fax number? [phone]  8.   Anesthesia type (None, local, MAC, general) ? Not listed    Ace Gins 09/12/2021, 2:00 PM  _________________________________________________________________   (provider comments below)

## 2021-09-13 NOTE — Telephone Encounter (Signed)
Dr. Rockey Situ Pt has a history of BMS x 2 to RCA with residual 70% LAD disease in 2007. Myoview last month for exertional CP was likely nonischemic. We are asked to hold plavix for colonoscopy - ok to hold?

## 2021-09-18 NOTE — Telephone Encounter (Signed)
    Patient Name: Patrick Barrera  DOB: 29-Dec-1955 MRN: [id number]  Primary Cardiologist: Ida Rogue, MD  Chart reviewed as part of pre-operative protocol coverage. Given past medical history and time since last visit, based on ACC/AHA guidelines, Raheem Kolbe would be at acceptable risk for the planned procedure without further cardiovascular testing.  Recent Myoview was reassuring.  Patient may hold Plavix for 5 days prior to the procedure and restart as soon as possible afterward at the his GI doctor's discretion.  He will need to continue on aspirin through the procedure  The patient was advised that if he develops new symptoms prior to surgery to contact our office to arrange for a follow-up visit, and he verbalized understanding.  I will route this recommendation to the requesting party via Epic fax function and remove from pre-op pool.  Please call with questions.  [place], [place] 09/18/2021, 5:48 PM

## 2021-09-19 ENCOUNTER — Telehealth: Payer: Self-pay

## 2021-09-19 NOTE — Telephone Encounter (Signed)
Able to reach pt regarding his recent CT Scan Dr. Rockey Situ had a chance to review his results and advised   "CT scan chest  5 mm nodule seen right upper lobe  Would consider repeat in 1 year to make sure it does not get bigger "  Patrick Barrera very thankful for the phone call of his results, all questions and concerns were address with nothing further at this time. Will see at next schedule f/u appt.

## 2021-10-13 ENCOUNTER — Other Ambulatory Visit: Payer: Self-pay

## 2021-10-13 ENCOUNTER — Encounter: Payer: Self-pay | Admitting: Family Medicine

## 2021-10-13 ENCOUNTER — Ambulatory Visit (INDEPENDENT_AMBULATORY_CARE_PROVIDER_SITE_OTHER): Payer: Medicare Other | Admitting: Family Medicine

## 2021-10-13 VITALS — BP 149/82 | HR 61 | Temp 98.1°F | Resp 16 | Ht 70.0 in | Wt 241.0 lb

## 2021-10-13 DIAGNOSIS — Z0000 Encounter for general adult medical examination without abnormal findings: Secondary | ICD-10-CM

## 2021-10-13 DIAGNOSIS — Z125 Encounter for screening for malignant neoplasm of prostate: Secondary | ICD-10-CM | POA: Diagnosis not present

## 2021-10-13 DIAGNOSIS — Z289 Immunization not carried out for unspecified reason: Secondary | ICD-10-CM | POA: Diagnosis not present

## 2021-10-13 DIAGNOSIS — Z23 Encounter for immunization: Secondary | ICD-10-CM | POA: Diagnosis not present

## 2021-10-13 DIAGNOSIS — E785 Hyperlipidemia, unspecified: Secondary | ICD-10-CM

## 2021-10-13 DIAGNOSIS — I739 Peripheral vascular disease, unspecified: Secondary | ICD-10-CM

## 2021-10-13 DIAGNOSIS — I25118 Atherosclerotic heart disease of native coronary artery with other forms of angina pectoris: Secondary | ICD-10-CM

## 2021-10-13 DIAGNOSIS — Z87891 Personal history of nicotine dependence: Secondary | ICD-10-CM

## 2021-10-13 DIAGNOSIS — M1711 Unilateral primary osteoarthritis, right knee: Secondary | ICD-10-CM

## 2021-10-13 MED ORDER — HYDROCODONE-ACETAMINOPHEN 5-325 MG PO TABS: 1.0000 | ORAL_TABLET | ORAL | 0 refills | Status: DC | PRN

## 2021-10-13 MED ORDER — SHINGRIX 50 MCG/0.5ML IM SUSR: 0.5000 mL | Freq: Once | INTRAMUSCULAR | 0 refills | Status: AC

## 2021-10-13 NOTE — Progress Notes (Signed)
Annual Wellness Visit     Patient: Patrick Barrera, Male    DOB: 1955-11-25, 66 y.o.   MRN: [id number] Visit Date: 10/13/2021  Today's Provider: Lelon Huh, MD   Chief Complaint  Patient presents with   [facility] [place] is a 66 y.o. male who presents today for his Annual Wellness Visit. He reports consuming a general diet. The patient does not participate in regular exercise at present. He generally feels fairly well. He reports sleeping poorly. He does  have additional problems to discuss today (knee pain and skin changes on leg).   HPI Follow up for insomnia:  The patient was last seen for this 1  year  ago. Changes made at last visit include Trying- zolpidem (AMBIEN) 5 MG tablet; Take 1 tablet (5 mg total) by mouth at bedtime as needed for sleep.  He reports poor compliance with treatment. Patient feels that the Ambien didn't help, so he stopped taking the medication. Patient has been taking Benadryl to help with sleeping at night. He feels that condition is Unchanged.  He is not having side effects.   -----------------------------------------------------------------------------------------   Hypertension, follow-up  BP Readings from Last 3 Encounters:  10/13/21 (!) 149/82  08/23/21 140/85  09/20/20 134/78   Wt Readings from Last 3 Encounters:  10/13/21 241 lb (109.3 kg)  08/23/21 238 lb 8 oz (108.2 kg)  09/20/20 220 lb (99.8 kg)     He was last seen for hypertension 1  year  ago.  BP at that visit was 134/78. Management since that visit includes continuing same medication.  He reports good compliance with treatment. He is not having side effects.  He is following a Regular diet. He is not exercising. He does not smoke.  Use of agents associated with hypertension: NSAIDS.   Outside blood pressures are not checked. Symptoms: No chest pain No chest pressure  No palpitations No syncope  No dyspnea No orthopnea  No  paroxysmal nocturnal dyspnea No lower extremity edema   Pertinent labs: Lab Results  Component Value Date   CHOL 135 09/20/2020   HDL 30 (L) 09/20/2020   LDLCALC 77 09/20/2020   TRIG 163 (H) 09/20/2020   CHOLHDL 4.5 09/20/2020   Lab Results  Component Value Date   NA 137 09/20/2020   K 4.4 09/20/2020   CREATININE 1.09 09/20/2020   GFRNONAA 70 09/20/2020   GLUCOSE 97 09/20/2020   TSH 2.270 12/13/2015     The 10-year ASCVD risk score (Arnett DK, et al., 2019) is: 22.3%   ---------------------------------------------------------------------------------------------------  Lipid/Cholesterol, Follow-up  Last lipid panel Other pertinent labs  Lab Results  Component Value Date   CHOL 135 09/20/2020   HDL 30 (L) 09/20/2020   LDLCALC 77 09/20/2020   TRIG 163 (H) 09/20/2020   CHOLHDL 4.5 09/20/2020   Lab Results  Component Value Date   ALT 24 09/20/2020   AST 18 09/20/2020   PLT 287 09/20/2020   TSH 2.270 12/13/2015     He was last seen for this 1  year  ago.  Management since that visit includes continuing same medication.  He reports good compliance with treatment. He is not having side effects.   Symptoms: No chest pain No chest pressure/discomfort  No dyspnea No lower extremity edema  No numbness or tingling of extremity No orthopnea  No palpitations No paroxysmal nocturnal dyspnea  No speech difficulty No syncope   Current diet: well balanced  Current exercise: none  The 10-year ASCVD risk score (Arnett DK, et al., 2019) is: 22.3%  ---------------------------------------------------------------------------------------------------    Medications: Outpatient Medications Prior to Visit  Medication Sig   acetaminophen (TYLENOL) 500 MG tablet Take 1,000 mg by mouth every 6 (six) hours as needed for moderate pain or headache.    aspirin EC 81 MG tablet Take 1 tablet (81 mg total) by mouth daily. Swallow whole.   atorvastatin (LIPITOR) 80 MG tablet Take 1 tablet  (80 mg total) by mouth daily.   clopidogrel (PLAVIX) 75 MG tablet Take 1 tablet (75 mg total) by mouth daily.   diphenhydrAMINE (BENADRYL) 50 MG tablet Take 50 mg by mouth at bedtime.   ezetimibe (ZETIA) 10 MG tablet Take 1 tablet (10 mg total) by mouth daily.   lisinopril (ZESTRIL) 10 MG tablet Take 1 tablet (10 mg total) by mouth daily.   meloxicam (MOBIC) 15 MG tablet Take 15 mg by mouth daily.   metoprolol succinate (TOPROL XL) 25 MG 24 hr tablet Take 1 tablet (25 mg total) by mouth daily.   nitroGLYCERIN (NITROSTAT) 0.4 MG SL tablet DISSOLVE 1 TABLET UNDER TONGUE EVERY 5 MIN AS NEED FOR CHEST PAIN. CALL 911 IF NO RELIEF AFTER 3DOSE   pantoprazole (PROTONIX) 20 MG tablet Take 1 tablet by mouth daily.   zolpidem (AMBIEN) 5 MG tablet Take 1 tablet (5 mg total) by mouth at bedtime as needed for sleep.   [DISCONTINUED] cyclobenzaprine (FLEXERIL) 10 MG tablet Take 1 tablet (10 mg total) by mouth 3 (three) times daily as needed for muscle spasms. (Patient not taking: No sig reported)   [DISCONTINUED] esomeprazole (NEXIUM) 20 MG capsule Take 20 mg by mouth at bedtime.   [DISCONTINUED] HYDROcodone-acetaminophen (NORCO/VICODIN) 5-325 MG tablet Take 1 tablet by mouth every 4 (four) hours as needed for moderate pain ((score 4 to 6)). (Patient not taking: No sig reported)   [DISCONTINUED] vitamin B-12 (CYANOCOBALAMIN) 1000 MCG tablet Take 1,000 mcg by mouth at bedtime. (Patient not taking: Reported on 08/23/2021)   No facility-administered medications prior to visit.    Allergies  Allergen Reactions   Codeine Nausea Only   Simvastatin     Muscle ache     Patient Care Team: Birdie Sons, MD as PCP - General (Family Medicine) Rockey Situ, Kathlene November, MD as PCP - Cardiology (Cardiology) Rockey Situ Kathlene November, MD as Consulting Physician (Cardiology) Manya Silvas, MD (Inactive) (Gastroenterology) Earnie Larsson, MD as Consulting Physician (Neurosurgery)  Review of Systems  Constitutional:  Negative  for appetite change, chills, fatigue and fever.  HENT:  Negative for congestion, ear pain, hearing loss, nosebleeds and trouble swallowing.   Eyes:  Negative for pain and visual disturbance.  Respiratory:  Negative for cough, chest tightness and shortness of breath.   Cardiovascular:  Negative for chest pain, palpitations and leg swelling.  Gastrointestinal:  Negative for abdominal pain, blood in stool, constipation, diarrhea, nausea and vomiting.  Endocrine: Negative for polydipsia, polyphagia and polyuria.  Genitourinary:  Negative for dysuria and flank pain.  Musculoskeletal:  Positive for arthralgias. Negative for back pain, joint swelling, myalgias and neck stiffness.  Skin:  Negative for color change, rash and wound.       Dry skin on lower leg  Neurological:  Negative for dizziness, tremors, seizures, speech difficulty, weakness, light-headedness and headaches.  Psychiatric/Behavioral:  Negative for behavioral problems, confusion, decreased concentration, dysphoric mood and sleep disturbance. The patient is not nervous/anxious.   All other systems reviewed and are negative.  Objective    Vitals: BP (!) 149/82 (BP Location: Left Arm, Patient Position: Sitting, Cuff Size: Large)    Pulse 61    Temp 98.1 F (36.7 C) (Oral)    Resp 16    Ht 5\' 10"  (1.778 m)    Wt 241 lb (109.3 kg)    SpO2 97% Comment: room air   BMI 34.58 kg/m    Today's Vitals   10/13/21 1018 10/13/21 1023  BP: (!) 151/83 (!) 149/82  Pulse: 61   Resp: 16   Temp: 98.1 F (36.7 C)   TempSrc: Oral   SpO2: 97%   Weight: 241 lb (109.3 kg)   Height: 5\' 10"  (1.778 m)    Body mass index is 34.58 kg/m.   Physical Exam   General: Appearance:    Mildly obese male in no acute distress  Eyes:    PERRL, conjunctiva/corneas clear, EOM's intact       Lungs:     Clear to auscultation bilaterally, respirations unlabored  Heart:    Normal heart rate. Normal rhythm. No murmurs, rubs, or gallops.    MS:   All  extremities are intact.    Neurologic:   Awake, alert, oriented x 3. No apparent focal neurological defect.         Most recent functional status assessment: In your present state of health, do you have any difficulty performing the following activities: 10/13/2021  Hearing? N  Vision? N  Difficulty concentrating or making decisions? N  Walking or climbing stairs? N  Dressing or bathing? N  Doing errands, shopping? N  Some recent data might be hidden   Most recent fall risk assessment: Fall Risk  10/13/2021  Falls in the past year? 0  Number falls in past yr: 0  Injury with Fall? 0  Risk for fall due to : No Fall Risks  Follow up Falls evaluation completed    Most recent depression screenings: PHQ 2/9 Scores 10/13/2021 09/20/2020  PHQ - 2 Score 0 0  PHQ- 9 Score 2 2   Most recent cognitive screening: No flowsheet data found. Most recent Audit-C alcohol use screening Alcohol Use Disorder Test (AUDIT) 09/20/2020  1. How often do you have a drink containing alcohol? 2  2. How many drinks containing alcohol do you have on a typical day when you are drinking? 0  3. How often do you have six or more drinks on one occasion? 1  AUDIT-C Score 3  4. How often during the last year have you found that you were not able to stop drinking once you had started? 0  5. How often during the last year have you failed to do what was normally expected from you because of drinking? 0  6. How often during the last year have you needed a first drink in the morning to get yourself going after a heavy drinking session? 0  7. How often during the last year have you had a feeling of guilt of remorse after drinking? 0  8. How often during the last year have you been unable to remember what happened the night before because you had been drinking? 0  9. Have you or someone else been injured as a result of your drinking? 0  10. Has a relative or friend or a doctor or another health worker been concerned about  your drinking or suggested you cut down? 0  Alcohol Use Disorder Identification Test Final Score (AUDIT) 3  Alcohol Brief Interventions/Follow-up AUDIT Score <  7 follow-up not indicated   A score of 3 or more in women, and 4 or more in men indicates increased risk for alcohol abuse, EXCEPT if all of the points are from question 1   No results found for any visits on 10/13/21.  Assessment & Plan     Annual wellness visit done today including the all of the following: Reviewed patient's Family Medical History Reviewed and updated list of patient's medical providers Assessment of cognitive impairment was done Assessed patient's functional ability Established a written schedule for health screening services Health Risk Assessent Completed and Reviewed  Exercise Activities and Dietary recommendations  Goals   None     Immunization History  Administered Date(s) Administered   Fluad Quad(high Dose 65+) 07/06/2019, 09/20/2020   Influenza,inj,Quad PF,6+ Mos 07/14/2018   Pneumococcal Conjugate-13 07/06/2019   Pneumococcal Polysaccharide-23 01/14/2017   Td 02/18/2018   Tdap 12/05/2007   Zoster Recombinat (Shingrix) 07/06/2019   Zoster, Live 12/09/2015    Health Maintenance  Topic Date Due   COVID-19 Vaccine (1) Never done   Zoster Vaccines- Shingrix (2 of 2) 08/31/2019   INFLUENZA VACCINE  05/08/2021   Pneumonia Vaccine 46+ Years old (3 - PPSV23 if available, else PCV20) 01/14/2022   COLONOSCOPY (Pts 45-32yrs Insurance coverage will need to be confirmed)  11/08/2022   TETANUS/TDAP  02/19/2028   Hepatitis C Screening  Completed   HPV VACCINES  Aged Out     Discussed health benefits of physical activity, and encouraged him to engage in regular exercise appropriate for his age and condition.    1. Need for influenza vaccination  - Flu Vaccine QUAD High Dose IM (Fluad)  2. Prostate cancer screening  - PSA Total (Reflex To Free) ([facility] only)  3. Need for pneumococcal  vaccination  - Pneumococcal polysaccharide vaccine 23-valent greater than or equal to 68yo subcutaneous/IM  4. Prescription for Shingrix. Vaccine not administered in office.   - Zoster Vaccine Adjuvanted [facility]) injection; Inject 0.5 mLs into the muscle once for 1 dose.  Dispense: 0.5 mL; Refill: 0  (#2)  5. Peripheral artery disease (HCC) Stable, continue statin and tight blood pressure control.   6. Atherosclerosis of native coronary artery of native heart with stable angina pectoris (HCC) Asymptomatic. Compliant with medication.  Continue aggressive risk factor modification.    7. Dyslipidemia He is tolerating atorvastatin and ezetimibe well with no adverse effects.   - CBC - Comprehensive metabolic panel - Lipid panel  8. History of smoking 30 or more pack years UTD with LC screenings.   9. Primary osteoarthritis of right knee Anticipating knee replacement in March.  - HYDROcodone-acetaminophen (NORCO/VICODIN) 5-325 MG tablet; Take 1 tablet by mouth every 4 (four) hours as needed for moderate pain ((score 4 to 6)).  Dispense: 30 tablet; Refill: 0      The entirety of the information documented in the History of Present Illness, Review of Systems and Physical Exam were personally obtained by me. Portions of this information were initially documented by the CMA and reviewed by me for thoroughness and accuracy.     Lelon Huh, MD  [facility] [phone] (phone) [phone] (fax)  [place]

## 2021-10-13 NOTE — Patient Instructions (Addendum)
Please review the attached list of medications and notify my office if there are any errors.   Last recorded height was 5\' 10" . Weight goal for a BMI of 30 is 209.1 pounds.    DASH Eating Plan DASH stands for Dietary Approaches to Stop Hypertension. The DASH eating plan is a healthy eating plan that has been shown to: Reduce high blood pressure (hypertension). Reduce your risk for type 2 diabetes, heart disease, and stroke. Help with weight loss. What are tips for following this plan? Reading food labels Check food labels for the amount of salt (sodium) per serving. Choose foods with less than 5 percent of the Daily Value of sodium. Generally, foods with less than 300 milligrams (mg) of sodium per serving fit into this eating plan. To find whole grains, look for the word "whole" as the first word in the ingredient list. Shopping Buy products labeled as "low-sodium" or "no salt added." Buy fresh foods. Avoid canned foods and pre-made or frozen meals. Cooking Avoid adding salt when cooking. Use salt-free seasonings or herbs instead of table salt or sea salt. Check with your health care provider or pharmacist before using salt substitutes. Do not fry foods. Cook foods using healthy methods such as baking, boiling, grilling, roasting, and broiling instead. Cook with heart-healthy oils, such as olive, canola, avocado, soybean, or sunflower oil. Meal planning  Eat a balanced diet that includes: 4 or more servings of fruits and 4 or more servings of vegetables each day. Try to fill one-half of your plate with fruits and vegetables. 6-8 servings of whole grains each day. Less than 6 oz (170 g) of lean meat, poultry, or fish each day. A 3-oz (85-g) serving of meat is about the same size as a deck of cards. One egg equals 1 oz (28 g). 2-3 servings of low-fat dairy each day. One serving is 1 cup (237 mL). 1 serving of nuts, seeds, or beans 5 times each week. 2-3 servings of heart-healthy fats.  Healthy fats called omega-3 fatty acids are found in foods such as walnuts, flaxseeds, fortified milks, and eggs. These fats are also found in cold-water fish, such as sardines, salmon, and mackerel. Limit how much you eat of: Canned or prepackaged foods. Food that is high in trans fat, such as some fried foods. Food that is high in saturated fat, such as fatty meat. Desserts and other sweets, sugary drinks, and other foods with added sugar. Full-fat dairy products. Do not salt foods before eating. Do not eat more than 4 egg yolks a week. Try to eat at least 2 vegetarian meals a week. Eat more home-cooked food and less restaurant, buffet, and fast food. Lifestyle When eating at a restaurant, ask that your food be prepared with less salt or no salt, if possible. If you drink alcohol: Limit how much you use to: 0-1 drink a day for women who are not pregnant. 0-2 drinks a day for men. Be aware of how much alcohol is in your drink. In the [place] one drink equals one 12 oz bottle of beer (355 mL), one 5 oz glass of wine (148 mL), or one 1 oz glass of hard liquor (44 mL). General information Avoid eating more than 2,300 mg of salt a day. If you have hypertension, you may need to reduce your sodium intake to 1,500 mg a day. Work with your health care provider to maintain a healthy body weight or to lose weight. Ask what an ideal weight is for you.  Get at least 30 minutes of exercise that causes your heart to beat faster (aerobic exercise) most days of the week. Activities may include walking, swimming, or biking. Work with your health care provider or dietitian to adjust your eating plan to your individual calorie needs. What foods should I eat? Fruits All fresh, dried, or frozen fruit. Canned fruit in natural juice (without added sugar). Vegetables Fresh or frozen vegetables (raw, steamed, roasted, or grilled). Low-sodium or reduced-sodium tomato and vegetable juice. Low-sodium or reduced-sodium  tomato sauce and tomato paste. Low-sodium or reduced-sodium canned vegetables. Grains Whole-grain or whole-wheat bread. Whole-grain or whole-wheat pasta. Brown rice. [place]. Bulgur. Whole-grain and low-sodium cereals. Pita bread. Low-fat, low-sodium crackers. Whole-wheat flour tortillas. Meats and other proteins Skinless chicken or [place]. Ground chicken or [place]. Pork with fat trimmed off. Fish and seafood. Egg whites. Dried beans, peas, or lentils. Unsalted nuts, nut butters, and seeds. Unsalted canned beans. Lean cuts of beef with fat trimmed off. Low-sodium, lean precooked or cured meat, such as sausages or meat loaves. Dairy Low-fat (1%) or fat-free (skim) milk. Reduced-fat, low-fat, or fat-free cheeses. Nonfat, low-sodium ricotta or cottage cheese. Low-fat or nonfat yogurt. Low-fat, low-sodium cheese. Fats and oils Soft margarine without trans fats. Vegetable oil. Reduced-fat, low-fat, or light mayonnaise and salad dressings (reduced-sodium). Canola, safflower, olive, avocado, soybean, and sunflower oils. Avocado. Seasonings and condiments Herbs. Spices. Seasoning mixes without salt. Other foods Unsalted popcorn and pretzels. Fat-free sweets. The items listed above may not be a complete list of foods and beverages you can eat. Contact a dietitian for more information. What foods should I avoid? Fruits Canned fruit in a light or heavy syrup. Fried fruit. Fruit in cream or butter sauce. Vegetables Creamed or fried vegetables. Vegetables in a cheese sauce. Regular canned vegetables (not low-sodium or reduced-sodium). Regular canned tomato sauce and paste (not low-sodium or reduced-sodium). Regular tomato and vegetable juice (not low-sodium or reduced-sodium). Angie Fava. Olives. Grains Baked goods made with fat, such as croissants, muffins, or some breads. Dry pasta or rice meal packs. Meats and other proteins Fatty cuts of meat. Ribs. Fried meat. Berniece Salines. Bologna, salami, and other  precooked or cured meats, such as sausages or meat loaves. Fat from the back of a pig (fatback). Bratwurst. Salted nuts and seeds. Canned beans with added salt. Canned or smoked fish. Whole eggs or egg yolks. Chicken or [place] with skin. Dairy Whole or 2% milk, cream, and half-and-half. Whole or full-fat cream cheese. Whole-fat or sweetened yogurt. Full-fat cheese. Nondairy creamers. Whipped toppings. Processed cheese and cheese spreads. Fats and oils Butter. Stick margarine. Lard. Shortening. Ghee. Bacon fat. Tropical oils, such as coconut, palm kernel, or palm oil. Seasonings and condiments Onion salt, garlic salt, seasoned salt, table salt, and sea salt. Worcestershire sauce. Tartar sauce. Barbecue sauce. Teriyaki sauce. Soy sauce, including reduced-sodium. Steak sauce. Canned and packaged gravies. Fish sauce. Oyster sauce. Cocktail sauce. Store-bought horseradish. Ketchup. Mustard. Meat flavorings and tenderizers. Bouillon cubes. Hot sauces. Pre-made or packaged marinades. Pre-made or packaged taco seasonings. Relishes. Regular salad dressings. Other foods Salted popcorn and pretzels. The items listed above may not be a complete list of foods and beverages you should avoid. Contact a dietitian for more information. Where to find more information National Heart, Lung, and Blood Institute: https://wilson-eaton.com/ American Heart Association: www.heart.org Academy of Nutrition and Dietetics: www.eatright.[place]: www.kidney.org Summary The DASH eating plan is a healthy eating plan that has been shown to reduce high blood pressure (hypertension). It may also  reduce your risk for type 2 diabetes, heart disease, and stroke. When on the DASH eating plan, aim to eat more fresh fruits and vegetables, whole grains, lean proteins, low-fat dairy, and heart-healthy fats. With the DASH eating plan, you should limit salt (sodium) intake to 2,300 mg a day. If you have hypertension, you may need  to reduce your sodium intake to 1,500 mg a day. Work with your health care provider or dietitian to adjust your eating plan to your individual calorie needs. This information is not intended to replace advice given to you by your health care provider. Make sure you discuss any questions you have with your health care provider. Document Revised: 08/28/2019 Document Reviewed: 08/28/2019 Elsevier Patient Education  2022 [employer].

## 2021-10-17 LAB — COMPREHENSIVE METABOLIC PANEL
ALT: 44 IU/L (ref 0–44)
AST: 25 IU/L (ref 0–40)
Albumin/Globulin Ratio: 1.3 (ref 1.2–2.2)
Albumin: 4 g/dL (ref 3.8–4.8)
Alkaline Phosphatase: 99 IU/L (ref 44–121)
BUN/Creatinine Ratio: 20 (ref 10–24)
BUN: 23 mg/dL (ref 8–27)
Bilirubin Total: 0.6 mg/dL (ref 0.0–1.2)
CO2: 22 mmol/L (ref 20–29)
Calcium: 9.5 mg/dL (ref 8.6–10.2)
Chloride: 105 mmol/L (ref 96–106)
Creatinine, Ser: 1.16 mg/dL (ref 0.76–1.27)
Globulin, Total: 3.1 g/dL (ref 1.5–4.5)
Glucose: 114 mg/dL — ABNORMAL HIGH (ref 70–99)
Potassium: 4.8 mmol/L (ref 3.5–5.2)
Sodium: 140 mmol/L (ref 134–144)
Total Protein: 7.1 g/dL (ref 6.0–8.5)
eGFR: 69 mL/min/{1.73_m2} (ref >59)

## 2021-10-17 LAB — CBC
Hematocrit: 43 % (ref 37.5–51.0)
Hemoglobin: 14.5 g/dL (ref 13.0–17.7)
MCH: 32.8 pg (ref 26.6–33.0)
MCHC: 33.7 g/dL (ref 31.5–35.7)
MCV: 97 fL (ref 79–97)
Platelets: 260 10*3/uL (ref 150–450)
RBC: 4.42 x10E6/uL (ref 4.14–5.80)
RDW: 12.6 % (ref 11.6–15.4)
WBC: 7.1 10*3/uL (ref 3.4–10.8)

## 2021-10-17 LAB — LIPID PANEL
Chol/HDL Ratio: 4.4 ratio (ref 0.0–5.0)
Cholesterol, Total: 129 mg/dL (ref 100–199)
HDL: 29 mg/dL — ABNORMAL LOW (ref >39)
LDL Chol Calc (NIH): 64 mg/dL (ref 0–99)
Triglycerides: 217 mg/dL — ABNORMAL HIGH (ref 0–149)
VLDL Cholesterol Cal: 36 mg/dL (ref 5–40)

## 2021-10-17 LAB — PSA TOTAL (REFLEX TO FREE): Prostate Specific Ag, Serum: 0.5 ng/mL (ref 0.0–4.0)

## 2021-11-17 ENCOUNTER — Encounter: Payer: Self-pay | Admitting: Gastroenterology

## 2021-11-19 ENCOUNTER — Encounter: Payer: Self-pay | Admitting: Gastroenterology

## 2021-11-19 NOTE — H&P (Signed)
Pre-Procedure H&P   Patient ID: SLADE PIERPOINT is a 66 y.o. male.  Gastroenterology Provider: Annamaria Helling, DO  Referring Provider: Octavia Bruckner, PA PCP: Birdie Sons, MD  Date: 11/20/2021  HPI Mr. KIRUBEL AJA is a 66 y.o. male who presents today for Esophagogastroduodenoscopy for Barretts Esophagus surveillance.  Diagnosed with short segment BE w/o dysplasia. Has undergone regular surveillance q3y. Last checked in 2019- 3cm tongue in length. Normal stomach and duodenum. Small HH.  On plavix- held for this procedure.  Chronic diarrhea. Denies odynophagia, dysphagia, changes in weight.  Acid reflux controlled with ppi. Recently switched from esomeprazole otc to protonix rx. Quit tobacco multiple years ago (has 60 pack year hx).  No other acute gi complaints.  Mos trecent labs- hgb 14.5, mcv 97 plt 260 Cr 1.2   Past Medical History:  Diagnosis Date   Cholesterol blood lowered    Complex tear of lateral meniscus of right knee as current injury 03/20/2019   Complex tear of medial meniscus of right knee as current injury 03/20/2019   Coronary artery disease    Elevated lipids    GERD (gastroesophageal reflux disease)    History of Barrett's esophagus    History of rectal bleeding    Hypertension    Myocardial infarction ([place]) 11/07   With occlusion of RCA, stents x2   PONV (postoperative nausea and vomiting)     Past Surgical History:  Procedure Laterality Date   ANTERIOR CERVICAL DECOMP/DISCECTOMY FUSION N/A 09/06/2020   Procedure: Anterior Cervical Decompression Discectomy Fusion Cervical four-five;  Surgeon: Earnie Larsson, MD;  Location: [place];  Service: Neurosurgery;  Laterality: N/A;   APPENDECTOMY     CARDIAC CATHETERIZATION     CARPAL TUNNEL RELEASE Left 09/22/2018   Procedure: CARPAL TUNNEL RELEASE;  Surgeon: Dereck Leep, MD;  Location: [facility];  Service: Orthopedics;  Laterality: Left;   CHONDROPLASTY Right 05/11/2019   Procedure: CHONDROPLASTY;   Surgeon: Dereck Leep, MD;  Location: [facility];  Service: Orthopedics;  Laterality: Right;   COLON SURGERY     hyperplastic colon polyp removal   COLONOSCOPY WITH PROPOFOL N/A 11/08/2017   Procedure: COLONOSCOPY WITH PROPOFOL;  Surgeon: Manya Silvas, MD;  [facility]: [facility] END[facility];  Service: Endoscopy;  Laterality: N/A;   CORONARY STENT PLACEMENT  2007   x2   ESOPHAGOGASTRODUODENOSCOPY (EGD) WITH PROPOFOL N/A 11/08/2017   Procedure: ESOPHAGOGASTRODUODENOSCOPY (EGD) WITH PROPOFOL;  Surgeon: Manya Silvas, MD;  Location: [facility] ENDOSCOPY;  Service: Endoscopy;  Laterality: N/A;   GANGLION CYST EXCISION Right    KNEE ARTHROSCOPY Right 05/11/2019   Procedure: ARTHROSCOPY KNEE- partial medial/lateral menisectomy ;  Surgeon: Dereck Leep, MD;  Location: [facility];  Service: Orthopedics;  Laterality: Right;   TONSILLECTOMY AND ADENOIDECTOMY     UPPER GI ENDOSCOPY  04/23/2014   barretts esophagus, repeat 04/2017    Family History No h/o GI disease or malignancy  Review of Systems  Constitutional:  Negative for activity change, appetite change, chills, diaphoresis, fatigue, fever and unexpected weight change.  HENT:  Negative for trouble swallowing and voice change.   Respiratory:  Negative for shortness of breath and wheezing.   Cardiovascular:  Negative for chest pain, palpitations and leg swelling.  Gastrointestinal:  Positive for diarrhea. Negative for abdominal distention, abdominal pain, anal bleeding, blood in stool, constipation, nausea and vomiting.  Musculoskeletal:  Negative for arthralgias and myalgias.  Skin:  Negative for color change and pallor.  Neurological:  Negative for dizziness, syncope  and weakness.  Psychiatric/Behavioral:  Negative for confusion. The patient is not nervous/anxious.   All other systems reviewed and are negative.   Medications No current facility-administered medications on file prior to encounter.   Current Outpatient Medications on File  Prior to Encounter  Medication Sig Dispense Refill   aspirin EC 81 MG tablet Take 1 tablet (81 mg total) by mouth daily. Swallow whole. 90 tablet 3   atorvastatin (LIPITOR) 80 MG tablet Take 1 tablet (80 mg total) by mouth daily. 90 tablet 3   ezetimibe (ZETIA) 10 MG tablet Take 1 tablet (10 mg total) by mouth daily. 90 tablet 3   lisinopril (ZESTRIL) 10 MG tablet Take 1 tablet (10 mg total) by mouth daily. 90 tablet 3   meloxicam (MOBIC) 15 MG tablet Take 15 mg by mouth daily.     metoprolol succinate (TOPROL XL) 25 MG 24 hr tablet Take 1 tablet (25 mg total) by mouth daily. 90 tablet 3   vitamin B-12 (CYANOCOBALAMIN) 1000 MCG tablet Take 1,000 mcg by mouth daily.     acetaminophen (TYLENOL) 500 MG tablet Take 1,000 mg by mouth every 6 (six) hours as needed for moderate pain or headache.      clopidogrel (PLAVIX) 75 MG tablet Take 1 tablet (75 mg total) by mouth daily. 90 tablet 3   diphenhydrAMINE (BENADRYL) 50 MG tablet Take 50 mg by mouth at bedtime.     nitroGLYCERIN (NITROSTAT) 0.4 MG SL tablet DISSOLVE 1 TABLET UNDER TONGUE EVERY 5 MIN AS NEED FOR CHEST PAIN. CALL 911 IF NO RELIEF AFTER 3DOSE 25 tablet 0    Pertinent medications related to GI and procedure were reviewed by me with the patient prior to the procedure   Current Facility-Administered Medications:    0.9 %  sodium chloride infusion, , Intravenous, Continuous, Annamaria Helling, DO, Last Rate: 20 mL/hr at 11/20/21 1128, New Bag at 11/20/21 1128      Allergies  Allergen Reactions   Codeine Nausea Only   Simvastatin     Muscle ache    Allergies were reviewed by me prior to the procedure  Objective    Vitals:   11/20/21 1116  BP: (!) 158/97  Pulse: 89  Resp: 18  Temp: (!) 97.5 F (36.4 C)  TempSrc: Temporal  SpO2: 99%  Weight: 109.3 kg  Height: 5\' 9"  (1.753 m)    Physical Exam Vitals and nursing note reviewed.  Constitutional:      General: He is not in acute distress.    Appearance: Normal  appearance. He is not ill-appearing, toxic-appearing or diaphoretic.  HENT:     Head: Normocephalic and atraumatic.     Nose: Nose normal.     Mouth/Throat:     Mouth: Mucous membranes are moist.     Pharynx: Oropharynx is clear.  Eyes:     General: No scleral icterus.    Extraocular Movements: Extraocular movements intact.  Cardiovascular:     Rate and Rhythm: Normal rate and regular rhythm.     Heart sounds: Normal heart sounds. No murmur heard.   No friction rub. No gallop.  Pulmonary:     Effort: Pulmonary effort is normal. No respiratory distress.     Breath sounds: Normal breath sounds. No wheezing, rhonchi or rales.  Abdominal:     General: Bowel sounds are normal. There is no distension.     Palpations: Abdomen is soft.     Tenderness: There is no abdominal tenderness. There is no guarding or  rebound.  Musculoskeletal:     Cervical back: Neck supple.     Right lower leg: No edema.     Left lower leg: No edema.  Skin:    General: Skin is warm and dry.     Coloration: Skin is not jaundiced or pale.  Neurological:     General: No focal deficit present.     Mental Status: He is alert and oriented to person, place, and time. Mental status is at baseline.  Psychiatric:        Mood and Affect: Mood normal.        Behavior: Behavior normal.        Thought Content: Thought content normal.        Judgment: Judgment normal.     Assessment:  Mr. BARKLEY KRATOCHVIL is a 66 y.o. male  who presents today for Esophagogastroduodenoscopy for Barretts Esophagus surveillance. Plan:  Esophagogastroduodenoscopy with possible intervention today  Esophagogastroduodenoscopy with possible biopsy, control of bleeding, polypectomy, and interventions as necessary has been discussed with the patient/patient representative. Informed consent was obtained from the patient/patient representative after explaining the indication, nature, and risks of the procedure including but not limited to death,  bleeding, perforation, missed neoplasm/lesions, cardiorespiratory compromise, and reaction to medications. Opportunity for questions was given and appropriate answers were provided. Patient/patient representative has verbalized understanding is amenable to undergoing the procedure.   Annamaria Helling, DO  [facility] Gastroenterology  Portions of the record may have been created with voice recognition software. Occasional wrong-word or 'sound-a-like' substitutions may have occurred due to the inherent limitations of voice recognition software.  Read the chart carefully and recognize, using context, where substitutions may have occurred.

## 2021-11-20 ENCOUNTER — Other Ambulatory Visit: Payer: Self-pay

## 2021-11-20 ENCOUNTER — Encounter: Payer: Self-pay | Admitting: Gastroenterology

## 2021-11-20 ENCOUNTER — Encounter
Admission: RE | Disposition: A | Discharge status: Home / Self Care | Payer: Self-pay | Source: Ambulatory Visit | Attending: Gastroenterology

## 2021-11-20 ENCOUNTER — Ambulatory Visit: Payer: Medicare Other | Admitting: Certified Registered Nurse Anesthetist

## 2021-11-20 ENCOUNTER — Ambulatory Visit
Admission: RE | Admit: 2021-11-20 | Discharge: 2021-11-20 | Disposition: A | Discharge status: Home / Self Care | Payer: Medicare Other | Source: Ambulatory Visit | Attending: Gastroenterology | Admitting: Gastroenterology

## 2021-11-20 DIAGNOSIS — I2510 Atherosclerotic heart disease of native coronary artery without angina pectoris: Secondary | ICD-10-CM | POA: Diagnosis not present

## 2021-11-20 DIAGNOSIS — Z955 Presence of coronary angioplasty implant and graft: Secondary | ICD-10-CM | POA: Diagnosis not present

## 2021-11-20 DIAGNOSIS — K219 Gastro-esophageal reflux disease without esophagitis: Secondary | ICD-10-CM | POA: Insufficient documentation

## 2021-11-20 DIAGNOSIS — I10 Essential (primary) hypertension: Secondary | ICD-10-CM | POA: Diagnosis not present

## 2021-11-20 DIAGNOSIS — K259 Gastric ulcer, unspecified as acute or chronic, without hemorrhage or perforation: Secondary | ICD-10-CM | POA: Insufficient documentation

## 2021-11-20 DIAGNOSIS — I252 Old myocardial infarction: Secondary | ICD-10-CM | POA: Diagnosis not present

## 2021-11-20 DIAGNOSIS — Z87891 Personal history of nicotine dependence: Secondary | ICD-10-CM | POA: Insufficient documentation

## 2021-11-20 DIAGNOSIS — Z7902 Long term (current) use of antithrombotics/antiplatelets: Secondary | ICD-10-CM | POA: Diagnosis not present

## 2021-11-20 DIAGNOSIS — Z79899 Other long term (current) drug therapy: Secondary | ICD-10-CM | POA: Diagnosis not present

## 2021-11-20 DIAGNOSIS — I739 Peripheral vascular disease, unspecified: Secondary | ICD-10-CM | POA: Insufficient documentation

## 2021-11-20 DIAGNOSIS — K2980 Duodenitis without bleeding: Secondary | ICD-10-CM | POA: Insufficient documentation

## 2021-11-20 DIAGNOSIS — K2270 Barrett's esophagus without dysplasia: Secondary | ICD-10-CM | POA: Diagnosis not present

## 2021-11-20 DIAGNOSIS — K2950 Unspecified chronic gastritis without bleeding: Secondary | ICD-10-CM | POA: Insufficient documentation

## 2021-11-20 DIAGNOSIS — Z09 Encounter for follow-up examination after completed treatment for conditions other than malignant neoplasm: Secondary | ICD-10-CM | POA: Diagnosis not present

## 2021-11-20 HISTORY — PX: ESOPHAGOGASTRODUODENOSCOPY: SHX5428

## 2021-11-20 SURGERY — EGD (ESOPHAGOGASTRODUODENOSCOPY)
Anesthesia: General

## 2021-11-20 MED ORDER — SODIUM CHLORIDE 0.9 % IV SOLN: INTRAVENOUS | Status: DC

## 2021-11-20 MED ORDER — PROPOFOL 500 MG/50ML IV EMUL
INTRAVENOUS | Status: DC | PRN
  Administered 2021-11-20: 150 ug/kg/min via INTRAVENOUS

## 2021-11-20 MED ORDER — LIDOCAINE HCL (CARDIAC) PF 100 MG/5ML IV SOSY
PREFILLED_SYRINGE | INTRAVENOUS | Status: DC | PRN
Start: 2021-11-20 — End: 2021-11-20
  Administered 2021-11-20: 100 mg via INTRAVENOUS

## 2021-11-20 MED ORDER — GLYCOPYRROLATE 0.2 MG/ML IJ SOLN
INTRAMUSCULAR | Status: DC | PRN
  Administered 2021-11-20: .2 mg via INTRAVENOUS

## 2021-11-20 MED ORDER — PROPOFOL 10 MG/ML IV BOLUS
INTRAVENOUS | Status: DC | PRN
Start: 2021-11-20 — End: 2021-11-20
  Administered 2021-11-20: 50 mg via INTRAVENOUS
  Administered 2021-11-20: 30 mg via INTRAVENOUS

## 2021-11-20 NOTE — Anesthesia Preprocedure Evaluation (Addendum)
Anesthesia Evaluation  Patient identified by MRN, date of birth, ID band Patient awake    Reviewed: Allergy & Precautions, NPO status , Patient's Chart, lab work & pertinent test results  History of Anesthesia Complications (+) PONV and history of anesthetic complications  Airway Mallampati: II  TM Distance: >3 FB Neck ROM: Full    Dental no notable dental hx. (+) Teeth Intact   Pulmonary neg pulmonary ROS, neg sleep apnea, neg COPD, Patient abstained from smoking.Not current smoker, former smoker,    Pulmonary exam normal breath sounds clear to auscultation       Cardiovascular Exercise Tolerance: Good METShypertension, + CAD, + Past MI, + Cardiac Stents and + Peripheral Vascular Disease  Normal cardiovascular exam(-) dysrhythmias  Rhythm:Regular Rate:Normal     Neuro/Psych negative neurological ROS  negative psych ROS   GI/Hepatic negative GI ROS, Neg liver ROS, neg GERD  ,  Endo/Other  negative endocrine ROSneg diabetes  Renal/GU negative Renal ROS  negative genitourinary   Musculoskeletal  (+) Arthritis ,   Abdominal   Peds negative pediatric ROS (+)  Hematology negative hematology ROS (+)   Anesthesia Other Findings Past Medical History: No date: Cholesterol blood lowered 03/20/2019: Complex tear of lateral meniscus of right knee as current  injury 03/20/2019: Complex tear of medial meniscus of right knee as current  injury No date: Coronary artery disease No date: Elevated lipids No date: GERD (gastroesophageal reflux disease) No date: History of Barrett's esophagus No date: History of rectal bleeding No date: Hypertension 11/07: Myocardial infarction ([place])     Comment:  With occlusion of RCA, stents x2 No date: PONV (postoperative nausea and vomiting)  Reproductive/Obstetrics negative OB ROS                             Anesthesia Physical  Anesthesia Plan  ASA:  3  Anesthesia Plan: General   Post-op Pain Management: Minimal or no pain anticipated   Induction: Intravenous  PONV Risk Score and Plan: 3 and Ondansetron, TIVA and Propofol infusion  Airway Management Planned: Natural Airway  Additional Equipment: None  Intra-op Plan:   Post-operative Plan: Extubation in OR  Informed Consent: I have reviewed the patients History and Physical, chart, labs and discussed the procedure including the risks, benefits and alternatives for the proposed anesthesia with the patient or authorized representative who has indicated his/her understanding and acceptance.     Dental advisory given  Plan Discussed with: CRNA and Surgeon  Anesthesia Plan Comments: (Discussed risks of anesthesia with patient, including possibility of difficulty with spontaneous ventilation under anesthesia necessitating airway intervention, PONV, and rare risks such as cardiac or respiratory or neurological events, and allergic reactions. Discussed the role of CRNA in patient's perioperative care. Patient understands.)       Anesthesia Quick Evaluation

## 2021-11-20 NOTE — Anesthesia Postprocedure Evaluation (Signed)
Anesthesia Post Note  Patient: Patrick Barrera  Procedure(s) Performed: ESOPHAGOGASTRODUODENOSCOPY (EGD)  Patient location during evaluation: Endoscopy Anesthesia Type: General Level of consciousness: awake and alert Pain management: pain level controlled Vital Signs Assessment: post-procedure vital signs reviewed and stable Respiratory status: spontaneous breathing, nonlabored ventilation, respiratory function stable and patient connected to nasal cannula oxygen Cardiovascular status: blood pressure returned to baseline and stable Postop Assessment: no apparent nausea or vomiting Anesthetic complications: no   No notable events documented.   Last Vitals:  Vitals:   11/20/21 1231 11/20/21 1241  BP: 101/76 126/81  Pulse: 90 86  Resp: 13 15  Temp:  (!) 36.1 C  SpO2: 96% 98%    Last Pain:  Vitals:   11/20/21 1241  TempSrc: Temporal  PainSc: 0-No pain                 Arita Miss

## 2021-11-20 NOTE — Transfer of Care (Signed)
Immediate Anesthesia Transfer of Care Note  Patient: Patrick Barrera  Procedure(s) Performed: ESOPHAGOGASTRODUODENOSCOPY (EGD)  Patient Location: Endoscopy Unit  Anesthesia Type:General  Level of Consciousness: drowsy  Airway & Oxygen Therapy: Patient Spontanous Breathing  Post-op Assessment: Report given to RN and Post -op Vital signs reviewed and stable  Post vital signs: Reviewed and stable  Last Vitals:  Vitals Value Taken Time  BP 110/64 11/20/21 1221  Temp    Pulse 88 11/20/21 1221  Resp 14 11/20/21 1221  SpO2 92 % 11/20/21 1221    Last Pain:  Vitals:   11/20/21 1221  TempSrc:   PainSc: Asleep         Complications: No notable events documented.

## 2021-11-20 NOTE — Op Note (Signed)
[facility] Gastroenterology Patient Name: Patrick Barrera Procedure Date: 11/20/2021 11:46 AM MRN: [id number] Account #: [id number] Date of Birth: [birth year] Admit Type: Outpatient Age: 66 Room: [facility] ENDO ROOM 2 Gender: Male Note Status: Finalized Instrument Name: Upper Endoscope [id number] Procedure:             Upper GI endoscopy Indications:           Follow-up of Barrett's esophagus Providers:             Annamaria Helling DO, DO Referring MD:          Minna Merritts, MD (Referring MD) Medicines:             Monitored Anesthesia Care Complications:         No immediate complications. Estimated blood loss:                         Minimal. Procedure:             Pre-Anesthesia Assessment:                        - Prior to the procedure, a History and Physical was                         performed, and patient medications and allergies were                         reviewed. The patient is competent. The risks and                         benefits of the procedure and the sedation options and                         risks were discussed with the patient. All questions                         were answered and informed consent was obtained.                         Patient identification and proposed procedure were                         verified by the physician, the nurse, the anesthetist                         and the technician in the endoscopy suite. Mental                         Status Examination: alert and oriented. Airway                         Examination: normal oropharyngeal airway and neck                         mobility. Respiratory Examination: clear to                         auscultation. CV Examination: RRR, no murmurs, no S3  or S4. Prophylactic Antibiotics: The patient does not                         require prophylactic antibiotics. Prior                         Anticoagulants: The patient has taken no previous                          anticoagulant or antiplatelet agents. ASA Grade                         Assessment: III - A patient with severe systemic                         disease. After reviewing the risks and benefits, the                         patient was deemed in satisfactory condition to                         undergo the procedure. The anesthesia plan was to use                         monitored anesthesia care (MAC). Immediately prior to                         administration of medications, the patient was                         re-assessed for adequacy to receive sedatives. The                         heart rate, respiratory rate, oxygen saturations,                         blood pressure, adequacy of pulmonary ventilation, and                         response to care were monitored throughout the                         procedure. The physical status of the patient was                         re-assessed after the procedure.                        After obtaining informed consent, the endoscope was                         passed under direct vision. Throughout the procedure,                         the patient's blood pressure, pulse, and oxygen                         saturations were monitored continuously. The Endoscope  was introduced through the mouth, and advanced to the                         second part of duodenum. The upper GI endoscopy was                         accomplished without difficulty. The patient tolerated                         the procedure well. Findings:      Localized mild inflammation characterized by erythema, enlarged folds       was found in the duodenal bulb. Biopsies for histology were taken with a       cold forceps for evaluation of celiac disease. Estimated blood loss was       minimal.      One non-bleeding superficial gastric ulcer with no stigmata of bleeding       was found in the gastric antrum. The lesion was 4 mm in  largest       dimension. Biopsies were taken with a cold forceps for histology.       Estimated blood loss was minimal.      Scattered moderate inflammation characterized by erythema and linear       erosions was found in the entire examined stomach. Biopsies were taken       with a cold forceps for Helicobacter pylori testing. Estimated blood       loss was minimal.      The exam of the stomach was otherwise normal.      There were esophageal mucosal changes secondary to established       short-segment Barrett's disease present in the distal esophagus. The       maximum longitudinal extent of these mucosal changes was 3 cm in length.       Mucosa was biopsied with a cold forceps for histology. A total of 2       specimen bottles were sent to pathology. Estimated blood loss was       minimal.      Esophagogastric landmarks were identified: the gastroesophageal junction       was found at 39 cm from the incisors.      The exam was otherwise without abnormality. Impression:            - Duodenitis. Biopsied.                        - Non-bleeding gastric ulcer with no stigmata of                         bleeding. Biopsied.                        - Gastritis. Biopsied.                        - Esophageal mucosal changes secondary to established                         short-segment Barrett's disease. Biopsied.                        - Esophagogastric landmarks identified.                        -  The examination was otherwise normal. Recommendation:        - Discharge patient to home.                        - Resume previous diet.                        - No aspirin, ibuprofen, naproxen, or other                         non-steroidal anti-inflammatory drugs.                        - Resume Plavix (clopidogrel) at prior dose in 3 days.                         Refer to managing physician for further adjustment of                         therapy.                        - Use Nexium  (esomeprazole) 40 mg PO daily                         indefinitely.                        - Await pathology results.                        - Repeat upper endoscopy in 3 years for surveillance                         of Barrett's esophagus.                        - Return to GI office as previously scheduled.                        - The findings and recommendations were discussed with                         the patient. Procedure Code(s):     --- Professional ---                        [phone], Esophagogastroduodenoscopy, flexible,                         transoral; with biopsy, single or multiple Diagnosis Code(s):     --- Professional ---                        K29.80, Duodenitis without bleeding                        K25.9, Gastric ulcer, unspecified as acute or chronic,                         without hemorrhage or perforation  K29.70, Gastritis, unspecified, without bleeding                        K22.70, Barrett's esophagus without dysplasia CPT copyright 2019 American Medical Association. All rights reserved. The codes documented in this report are preliminary and upon coder review may  be revised to meet current compliance requirements. Attending Participation:      I personally performed the entire procedure. Volney American, DO Annamaria Helling DO, DO 11/20/2021 12:26:10 PM This report has been signed electronically. Number of Addenda: 0 Note Initiated On: 11/20/2021 11:46 AM Estimated Blood Loss:  Estimated blood loss was minimal.      [facility]

## 2021-11-20 NOTE — Interval H&P Note (Signed)
History and Physical Interval Note: Preprocedure H&P from 11/20/21  was reviewed and there was no interval change after seeing and examining the patient.  Written consent was obtained from the patient after discussion of risks, benefits, and alternatives. Patient has consented to proceed with Esophagogastroduodenoscopy with possible intervention   11/20/2021 11:55 AM  Patrick Barrera  has presented today for surgery, with the diagnosis of Barrett's esophagus without dysplasia (K22.70).  The various methods of treatment have been discussed with the patient and family. After consideration of risks, benefits and other options for treatment, the patient has consented to  Procedure(s): ESOPHAGOGASTRODUODENOSCOPY (EGD) (N/A) as a surgical intervention.  The patient's history has been reviewed, patient examined, no change in status, stable for surgery.  I have reviewed the patient's chart and labs.  Questions were answered to the patient's satisfaction.     Annamaria Helling

## 2021-11-20 NOTE — Anesthesia Procedure Notes (Signed)
Date/Time: 11/20/2021 11:59 AM Performed by: Lily Peer, Amarionna Arca, CRNA Pre-anesthesia Checklist: Patient identified, Emergency Drugs available, Suction available, Patient being monitored and Timeout performed Patient Re-evaluated:Patient Re-evaluated prior to induction Oxygen Delivery Method: Simple face mask Induction Type: IV induction

## 2021-11-21 ENCOUNTER — Encounter: Payer: Self-pay | Admitting: Gastroenterology

## 2021-11-22 LAB — SURGICAL PATHOLOGY

## 2022-05-30 IMAGING — RF DG C-ARM 1-60 MIN
1 series · 2 of 2 positions shown · non-contrast
Comparison: Cervical MRI 08/10/2020

CLINICAL DATA: ACDF C4-5.  Removal of hardware  C5-6.

EXAM:
DG CERVICAL SPINE - 1 VIEW; DG C-ARM 1-60 MIN

[Series 1: unknown protocol · 0.14mm/px · 2 of 2 slices shown]
[im 1/2]
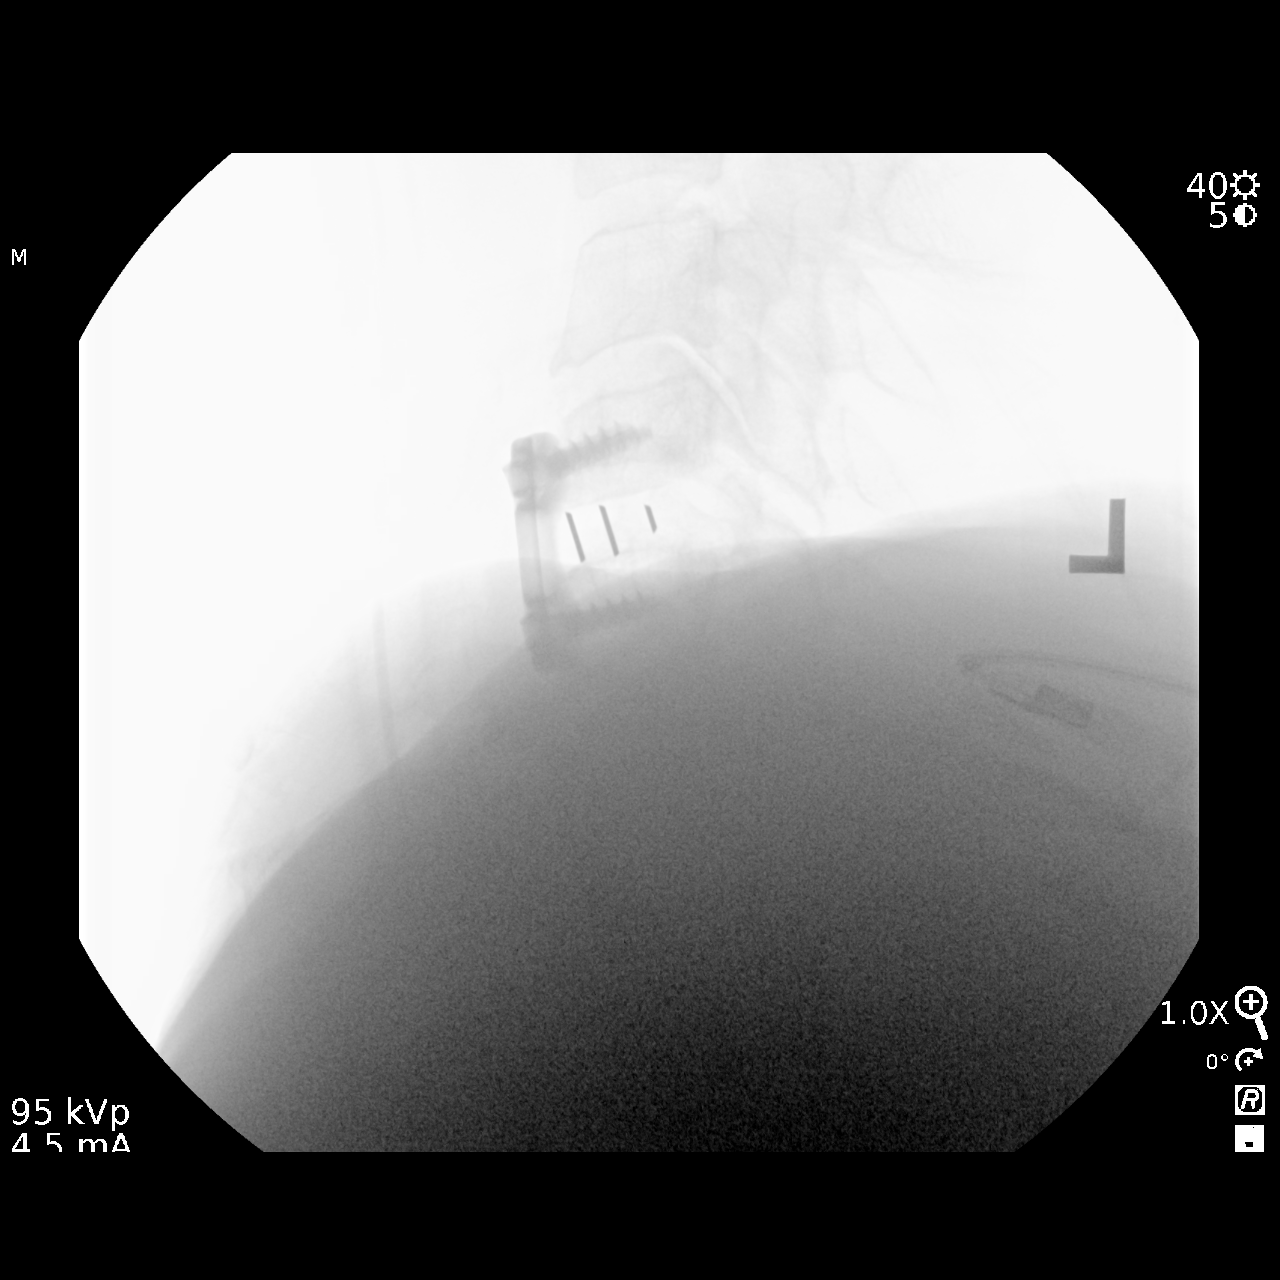
[im 2/2]
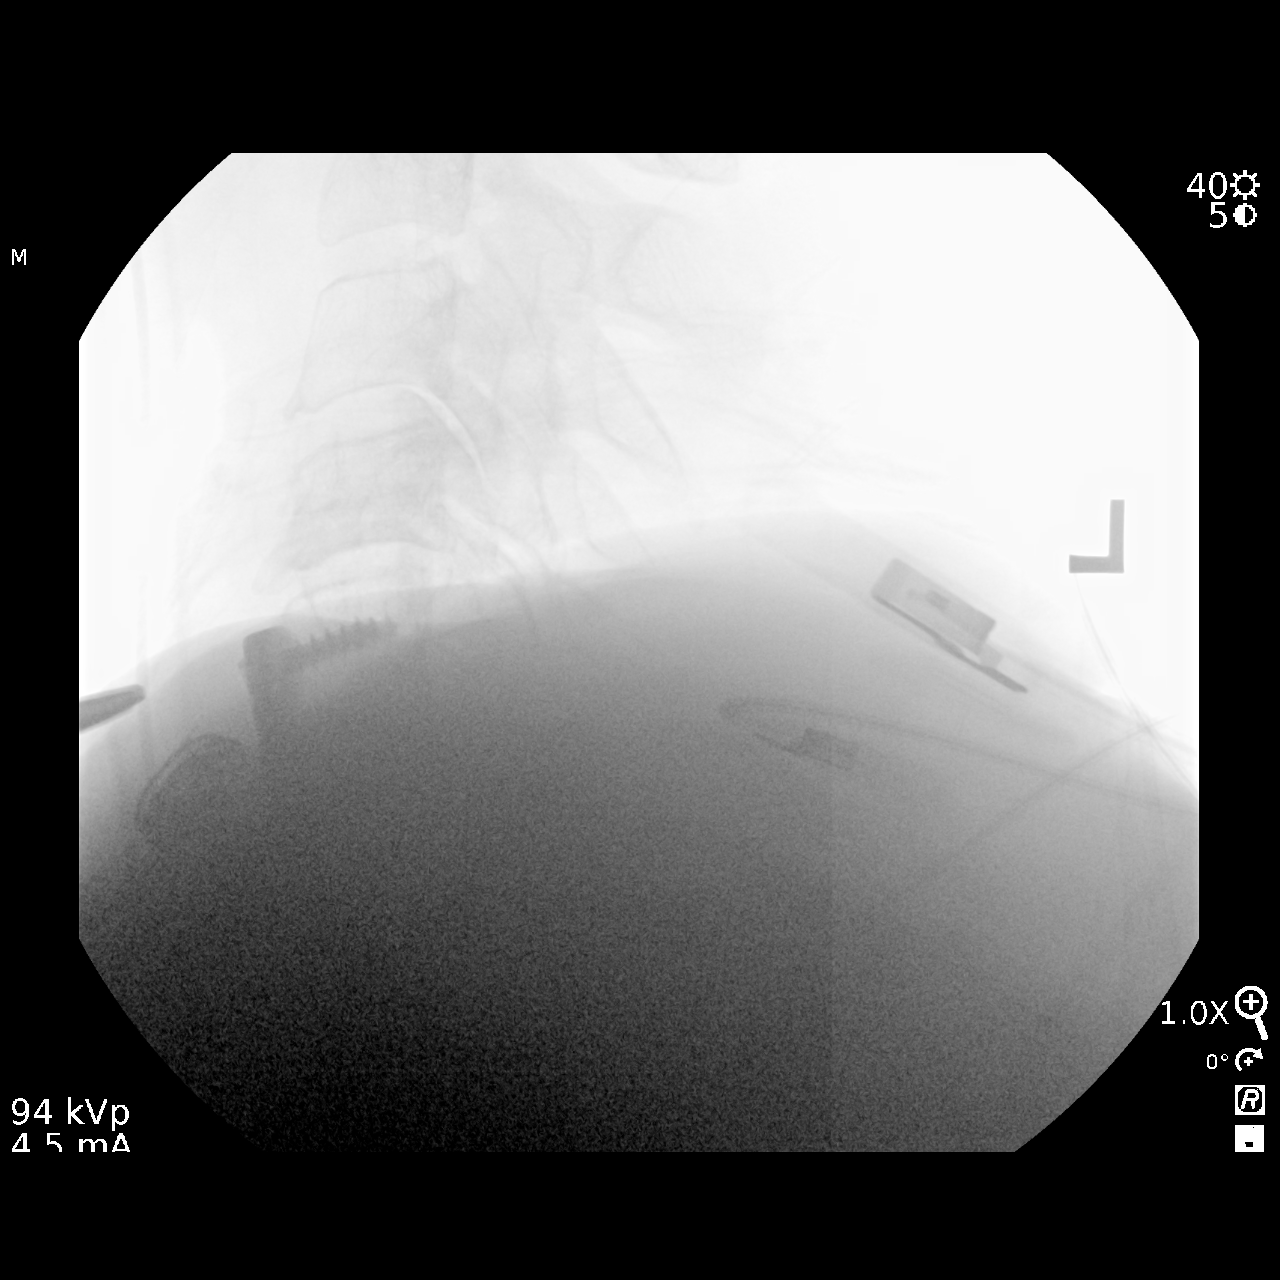

[2 of 2 positions shown; findings below may reference images not displayed]

FINDINGS: Two lateral cervical spine images obtained with C-arm device in the
operating room.

Anterior plate and screw at C5 and C6 is noted on the initial image.

Subsequent image demonstrates ACDF at C4-5 with interbody spacer in
satisfactory position.
IMPRESSION: ACDF C4-5.

## 2022-06-26 IMAGING — US US ABDOMINAL AORTA SCREENING AAA
1 series · 14 of 15 positions shown · non-contrast
Comparison: None.

CLINICAL DATA: Male between 65-75 years of age with a smoking
history.

EXAM:
US ABDOMINAL AORTA MEDICARE SCREENING
TECHNIQUE: Ultrasound examination of the abdominal aorta was performed as a
screening evaluation for abdominal aortic aneurysm.

[Series 1: us abdominal aorta screening aaa · 0.33mm/px · 14 of 15 slices shown]
[im 1/15]
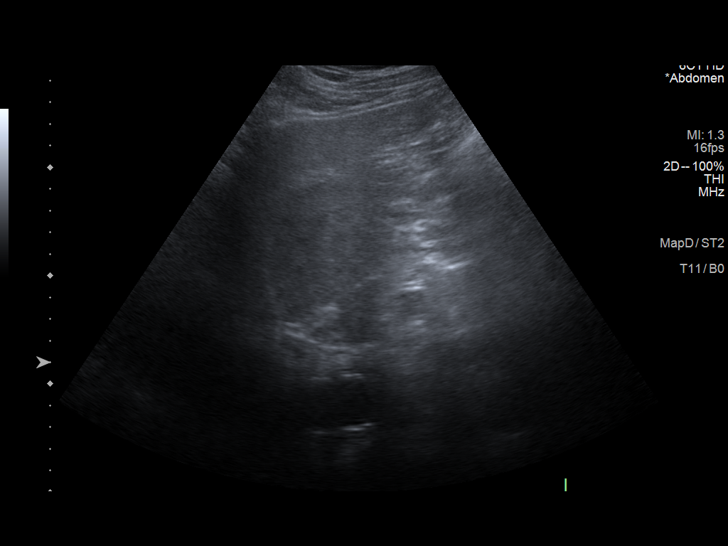
[im 2/15]
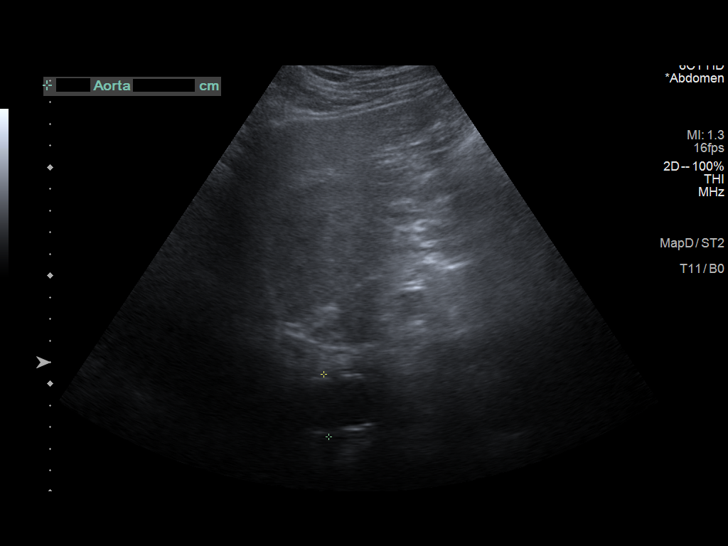
[im 3/15]
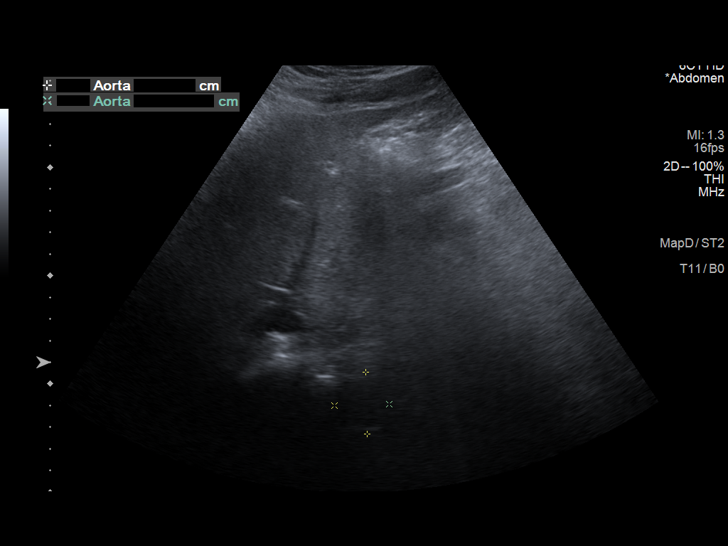
[im 4/15]
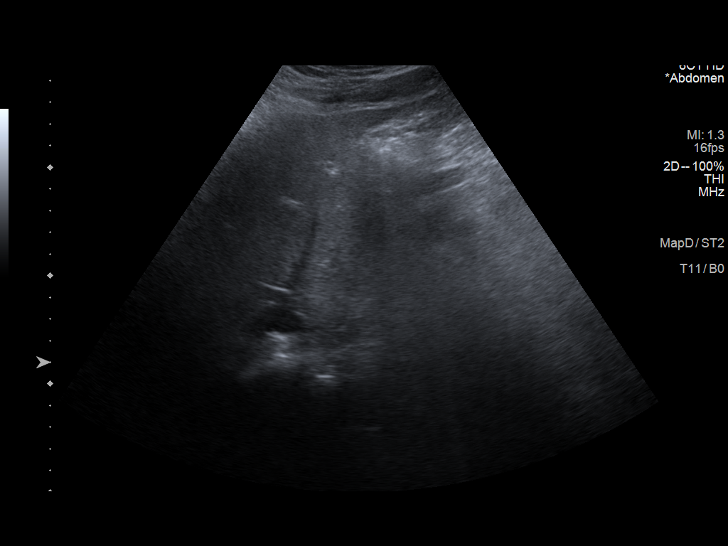
[im 5/15]
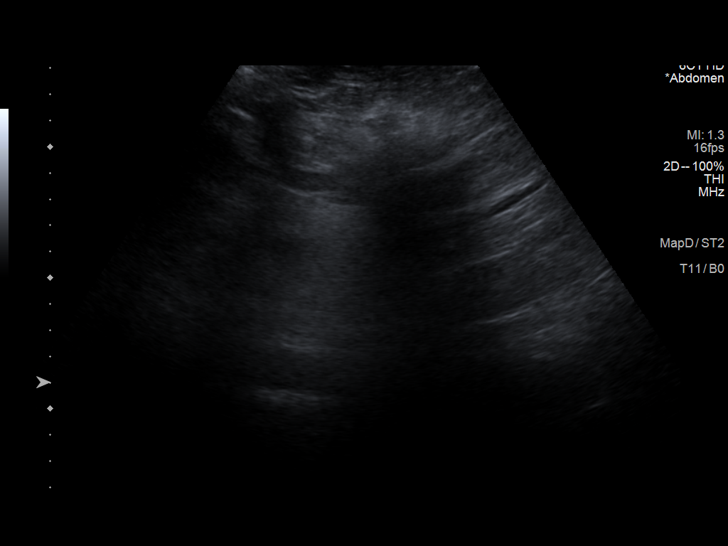
[im 6/15]
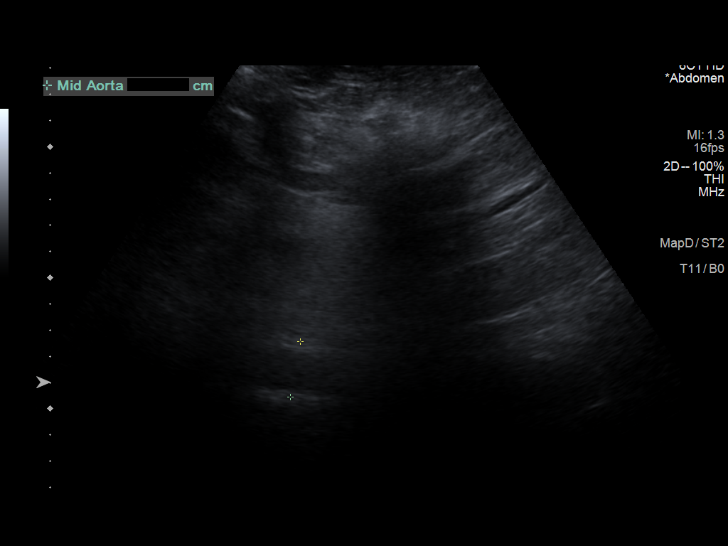
[im 7/15]
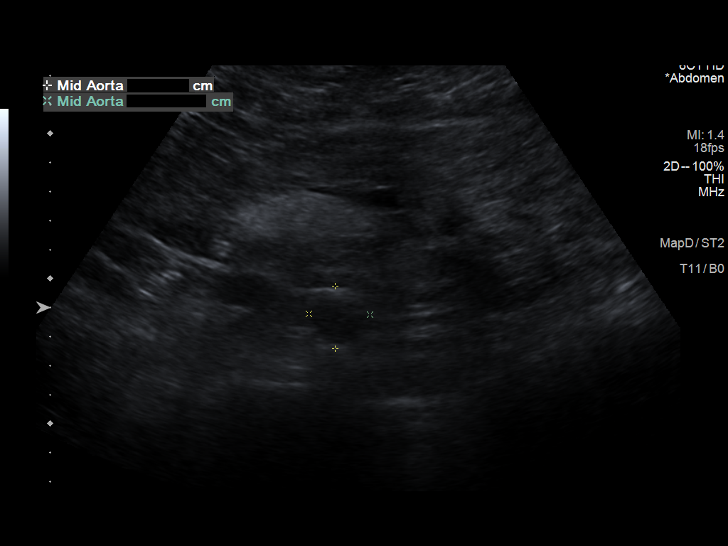
[im 9/15]
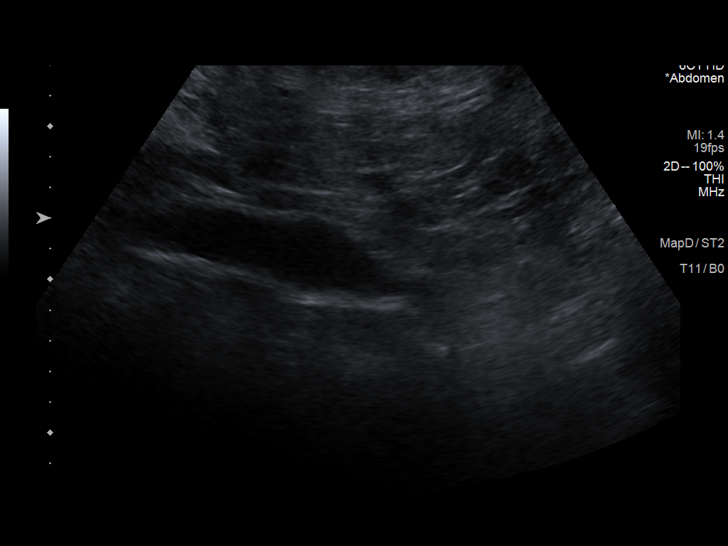
[im 10/15]
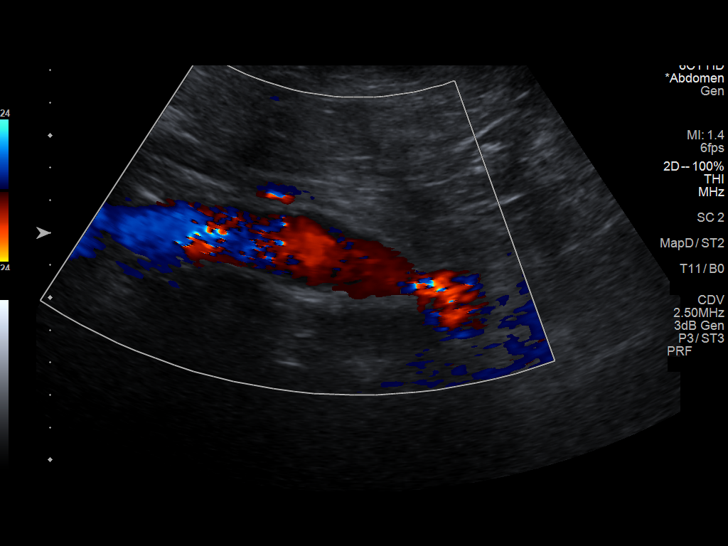
[im 11/15]
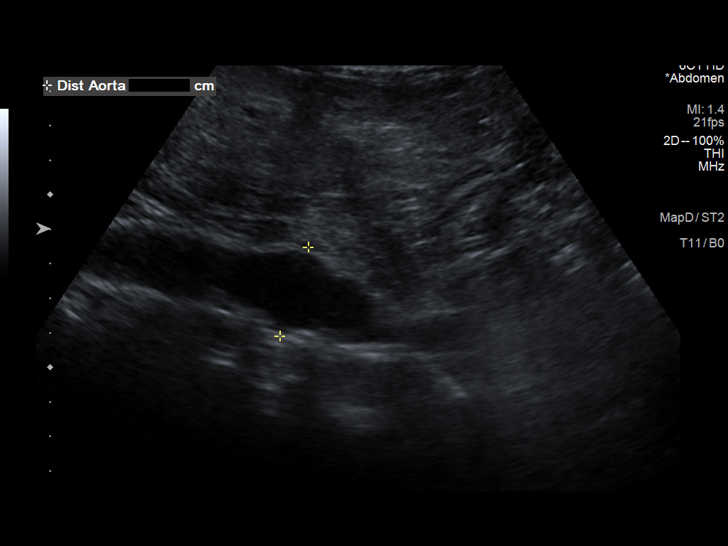
[im 12/15]
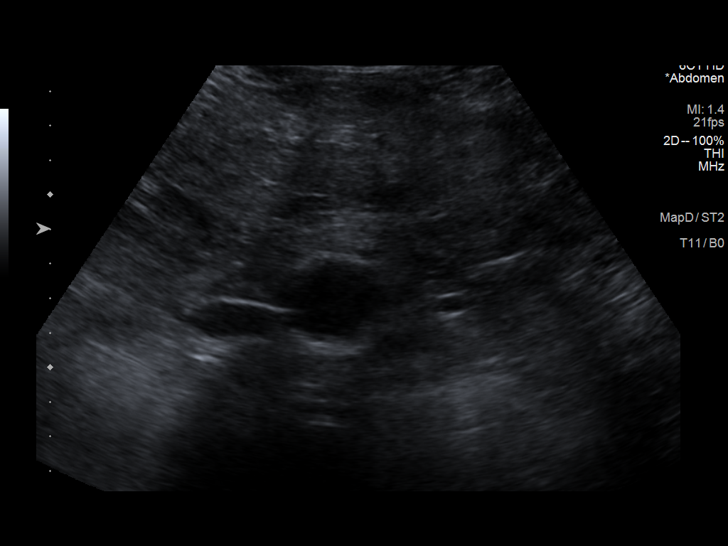
[im 13/15]
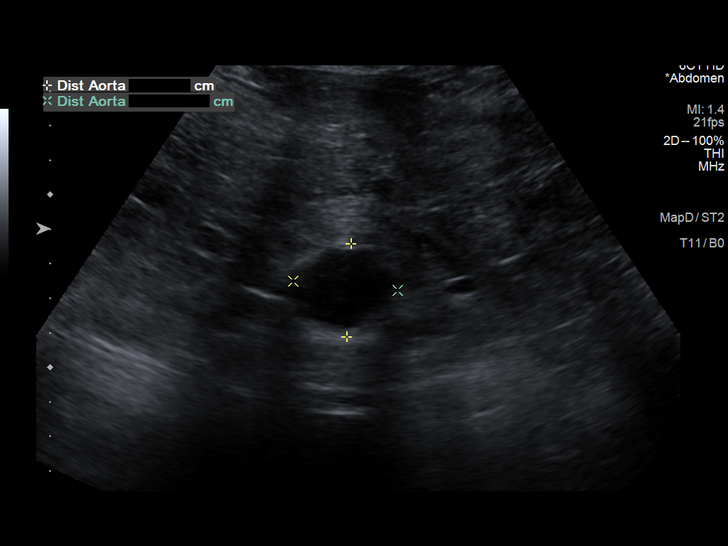
[im 14/15]
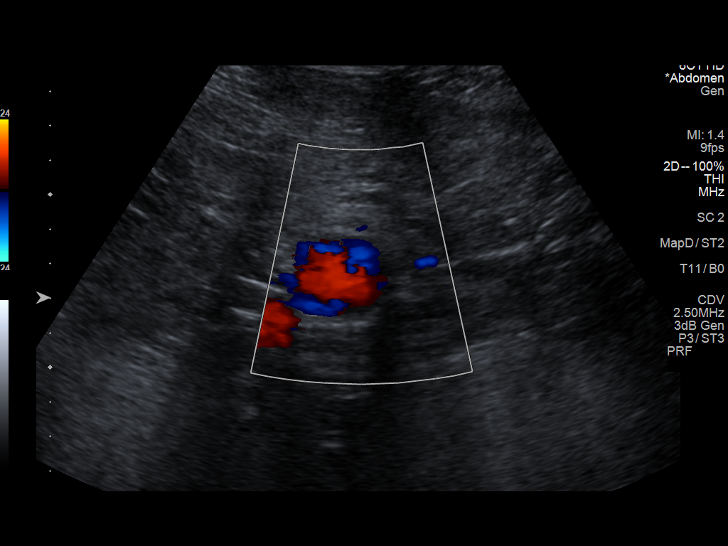
[im 15/15]
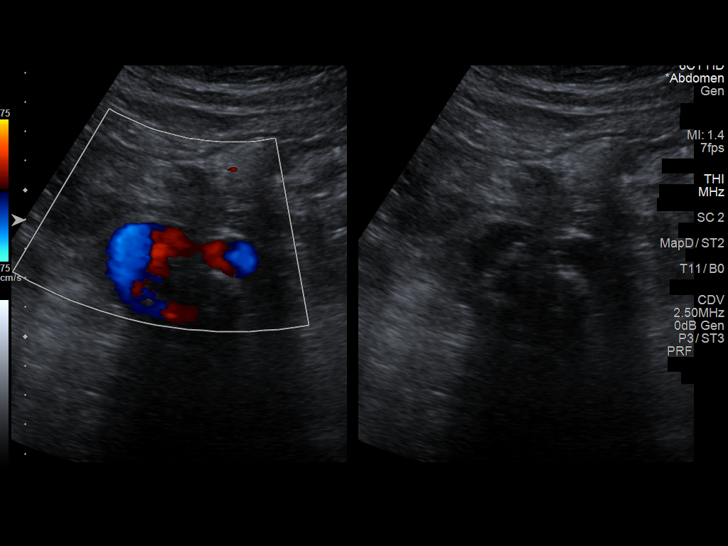

[14 of 15 positions shown; findings below may reference images not displayed]

FINDINGS: Abdominal aortic measurements as follows:

Proximal:  2.9 cm

Mid:  2.2 cm

Distal:  3.0 cm
IMPRESSION: Positive for mild aneurysmal dilation of the infrarenal abdominal
aorta with a maximal diameter of 3.0 cm.

Recommend follow-up every 3 years. This recommendation follows ACR
consensus guidelines: White Paper of the ACR Incidental Findings
Committee II on Vascular Findings. [HOSPITAL] [id number];
[DATE].

## 2022-07-14 ENCOUNTER — Other Ambulatory Visit: Payer: Self-pay | Admitting: Cardiovascular Disease

## 2022-09-06 ENCOUNTER — Other Ambulatory Visit: Payer: Self-pay | Admitting: Cardiovascular Disease

## 2022-10-08 DIAGNOSIS — Z9841 Cataract extraction status, right eye: Secondary | ICD-10-CM

## 2022-10-08 HISTORY — DX: Cataract extraction status, right eye: Z98.41

## 2022-10-28 ENCOUNTER — Other Ambulatory Visit: Payer: Self-pay | Admitting: Cardiovascular Disease

## 2022-10-31 ENCOUNTER — Ambulatory Visit (INDEPENDENT_AMBULATORY_CARE_PROVIDER_SITE_OTHER): Payer: Medicare Other | Admitting: Family Medicine

## 2022-10-31 ENCOUNTER — Encounter: Payer: Self-pay | Admitting: Family Medicine

## 2022-10-31 VITALS — BP 127/80 | HR 67 | Temp 98.6°F | Resp 16 | Ht 69.0 in | Wt 243.5 lb

## 2022-10-31 DIAGNOSIS — Z0000 Encounter for general adult medical examination without abnormal findings: Secondary | ICD-10-CM | POA: Diagnosis not present

## 2022-10-31 DIAGNOSIS — I10 Essential (primary) hypertension: Secondary | ICD-10-CM | POA: Diagnosis not present

## 2022-10-31 DIAGNOSIS — Z23 Encounter for immunization: Secondary | ICD-10-CM

## 2022-10-31 DIAGNOSIS — I714 Abdominal aortic aneurysm, without rupture, unspecified: Secondary | ICD-10-CM

## 2022-10-31 DIAGNOSIS — Z6835 Body mass index (BMI) 35.0-35.9, adult: Secondary | ICD-10-CM

## 2022-10-31 DIAGNOSIS — Z87891 Personal history of nicotine dependence: Secondary | ICD-10-CM

## 2022-10-31 DIAGNOSIS — I25118 Atherosclerotic heart disease of native coronary artery with other forms of angina pectoris: Secondary | ICD-10-CM | POA: Diagnosis not present

## 2022-10-31 DIAGNOSIS — E782 Mixed hyperlipidemia: Secondary | ICD-10-CM

## 2022-10-31 DIAGNOSIS — Z289 Immunization not carried out for unspecified reason: Secondary | ICD-10-CM

## 2022-10-31 DIAGNOSIS — Z125 Encounter for screening for malignant neoplasm of prostate: Secondary | ICD-10-CM

## 2022-10-31 DIAGNOSIS — I739 Peripheral vascular disease, unspecified: Secondary | ICD-10-CM

## 2022-10-31 MED ORDER — SHINGRIX 50 MCG/0.5ML IM SUSR: 0.5000 mL | Freq: Once | INTRAMUSCULAR | 0 refills | Status: AC

## 2022-10-31 NOTE — Patient Instructions (Signed)
Please review the attached list of medications and notify my office if there are any errors.   We will contact you in November to set an aortic ultrasound in December of 2024

## 2022-10-31 NOTE — Progress Notes (Signed)
Annual Wellness Visit     Patient: Patrick Barrera, Male    DOB: May 28, 1956, 67 y.o.   MRN: [id number] Visit Date: 10/31/2022  Today's Provider: Lelon Huh, MD    Subjective    CEASAR DECANDIA is a 67 y.o. male who presents today for his Annual Wellness Visit.  Medications: Outpatient Medications Prior to Visit  Medication Sig   acetaminophen (TYLENOL) 500 MG tablet Take 1,000 mg by mouth every 6 (six) hours as needed for moderate pain or headache.    aspirin EC 81 MG tablet Take 1 tablet (81 mg total) by mouth daily. Swallow whole.   atorvastatin (LIPITOR) 80 MG tablet TAKE 1 TABLET EVERY DAY   clopidogrel (PLAVIX) 75 MG tablet TAKE 1 TABLET EVERY DAY   diphenhydrAMINE (BENADRYL) 50 MG tablet Take 50 mg by mouth at bedtime.   ezetimibe (ZETIA) 10 MG tablet TAKE 1 TABLET EVERY DAY   lisinopril (ZESTRIL) 10 MG tablet TAKE 1 TABLET EVERY DAY (NEED MD APPOINTMENT)   metoprolol succinate (TOPROL-XL) 25 MG 24 hr tablet Take 1 tablet (25 mg total) by mouth daily. Additional refill available at office visit.   nitroGLYCERIN (NITROSTAT) 0.4 MG SL tablet DISSOLVE 1 TABLET UNDER TONGUE EVERY 5 MIN AS NEED FOR CHEST PAIN. CALL 911 IF NO RELIEF AFTER 3DOSE   pantoprazole (PROTONIX) 20 MG tablet Take 1 tablet by mouth daily.   [DISCONTINUED] HYDROcodone-acetaminophen (NORCO/VICODIN) 5-325 MG tablet Take 1 tablet by mouth every 4 (four) hours as needed for moderate pain ((score 4 to 6)). (Patient not taking: Reported on 10/31/2022)   [DISCONTINUED] meloxicam (MOBIC) 15 MG tablet Take 15 mg by mouth daily. (Patient not taking: Reported on 10/31/2022)   [DISCONTINUED] vitamin B-12 (CYANOCOBALAMIN) 1000 MCG tablet Take 1,000 mcg by mouth daily.   No facility-administered medications prior to visit.    Allergies  Allergen Reactions   Codeine Nausea Only   Simvastatin     Muscle ache     Patient Care Team: Birdie Sons, MD as PCP - General (Family Medicine) Rockey Situ, Kathlene November, MD as PCP -  Cardiology (Cardiology) Rockey Situ Kathlene November, MD as Consulting Physician (Cardiology) Manya Silvas, MD (Inactive) (Gastroenterology) Earnie Larsson, MD as Consulting Physician (Neurosurgery) Hallows, Verlee Monte, MD (Orthopedic Surgery) Lesly Rubenstein, MD as Consulting Physician (Gastroenterology)        Objective     Most recent functional status assessment:    10/31/2022    8:32 AM  In your present state of health, do you have any difficulty performing the following activities:  Hearing? 0  Vision? 1  Difficulty concentrating or making decisions? 0  Walking or climbing stairs? 0  Dressing or bathing? 0  Doing errands, shopping? 0   Most recent fall risk assessment:    10/31/2022    8:31 AM  Fall Risk   Falls in the past year? 0  Number falls in past yr: 0  Injury with Fall? 0  Risk for fall due to : No Fall Risks  Follow up Falls evaluation completed    Most recent depression screenings:    10/31/2022    8:31 AM 10/13/2021   10:14 AM  PHQ 2/9 Scores  PHQ - 2 Score 0 0  PHQ- 9 Score 2 2   Most recent cognitive screening:    10/31/2022    8:32 AM  6CIT Screen  What Year? 0 points  What month? 0 points  What time? 0 points  Count back from  20 0 points  Months in reverse 0 points  Repeat phrase 0 points  Total Score 0 points   Most recent Audit-C alcohol use screening    10/31/2022    8:32 AM  Alcohol Use Disorder Test (AUDIT)  1. How often do you have a drink containing alcohol? 1  2. How many drinks containing alcohol do you have on a typical day when you are drinking? 0  3. How often do you have six or more drinks on one occasion? 1  AUDIT-C Score 2   A score of 3 or more in women, and 4 or more in men indicates increased risk for alcohol abuse, EXCEPT if all of the points are from question 1   No results found for any visits on 10/31/22.  Assessment & Plan     Annual wellness visit done today including the all of the following: Reviewed patient's  Family Medical History Reviewed and updated list of patient's medical providers Assessment of cognitive impairment was done Assessed patient's functional ability Established a written schedule for health screening [place] Completed and Reviewed  Exercise Activities and Dietary recommendations  Goals   None     Immunization History  Administered Date(s) Administered   Fluad Quad(high Dose 65+) 07/06/2019, 09/20/2020, 10/13/2021, 10/31/2022   Influenza,inj,Quad PF,6+ Mos 07/14/2018   Pneumococcal Conjugate-13 07/06/2019   Pneumococcal Polysaccharide-23 01/14/2017, 10/13/2021   Td 02/18/2018   Tdap 12/05/2007   Zoster Recombinat (Shingrix) 07/06/2019   Zoster, Live 12/09/2015    Health Maintenance  Topic Date Due   COVID-19 Vaccine (1) Never done   Zoster Vaccines- Shingrix (2 of 2) 08/31/2019   Lung Cancer Screening  09/06/2022   COLONOSCOPY (Pts 45-90yr Insurance coverage will need to be confirmed)  11/08/2022   Medicare Annual Wellness (AWV)  11/01/2023   DTaP/Tdap/Td (3 - Td or Tdap) 02/19/2028   Pneumonia Vaccine 67 Years old  Completed   INFLUENZA VACCINE  Completed   Hepatitis C Screening  Completed   HPV VACCINES  Aged Out     Discussed health benefits of physical activity, and encouraged him to engage in regular exercise appropriate for his age and condition.         The entirety of the information documented in the History of Present Illness, Review of Systems and Physical Exam were personally obtained by me. Portions of this information were initially documented by the CMA and reviewed by me for thoroughness and accuracy.     DLelon Huh MD  [phone](phone) [phone](fax)  [place]

## 2022-10-31 NOTE — Progress Notes (Signed)
I,Sulibeya S Dimas,acting as a scribe for Lelon Huh, MD.,have documented all relevant documentation on the behalf of Lelon Huh, MD,as directed by  Lelon Huh, MD while in the presence of Lelon Huh, MD.    Complete Physical Exam      Patient: Patrick Barrera, Male    DOB: 12-Dec-1955, 67 y.o.   MRN: [id number] Visit Date: 10/31/2022  Today's Provider: Lelon Huh, MD    Subjective    Patrick Barrera is a 67 y.o. male who presents today for his complete physical examination He reports consuming a general diet. The patient does not participate in regular exercise at present. He generally feels well. He reports sleeping fairly well. He does not have additional problems to discuss today.   Is also due to follow up on cholesterol, hypertension and CAD. Doing well with current medications. He has follow up scheduled with Dr. Rockey Situ on 11/05/2022.     Medications: Outpatient Medications Prior to Visit  Medication Sig   acetaminophen (TYLENOL) 500 MG tablet Take 1,000 mg by mouth every 6 (six) hours as needed for moderate pain or headache.    aspirin EC 81 MG tablet Take 1 tablet (81 mg total) by mouth daily. Swallow whole.   atorvastatin (LIPITOR) 80 MG tablet TAKE 1 TABLET EVERY DAY   clopidogrel (PLAVIX) 75 MG tablet TAKE 1 TABLET EVERY DAY   diphenhydrAMINE (BENADRYL) 50 MG tablet Take 50 mg by mouth at bedtime.   ezetimibe (ZETIA) 10 MG tablet TAKE 1 TABLET EVERY DAY   lisinopril (ZESTRIL) 10 MG tablet TAKE 1 TABLET EVERY DAY (NEED MD APPOINTMENT)   metoprolol succinate (TOPROL-XL) 25 MG 24 hr tablet Take 1 tablet (25 mg total) by mouth daily. Additional refill available at office visit.   nitroGLYCERIN (NITROSTAT) 0.4 MG SL tablet DISSOLVE 1 TABLET UNDER TONGUE EVERY 5 MIN AS NEED FOR CHEST PAIN. CALL 911 IF NO RELIEF AFTER 3DOSE   pantoprazole (PROTONIX) 20 MG tablet Take 1 tablet by mouth daily.   HYDROcodone-acetaminophen (NORCO/VICODIN) 5-325 MG tablet Take 1 tablet  by mouth every 4 (four) hours as needed for moderate pain ((score 4 to 6)). (Patient not taking: Reported on 10/31/2022)   meloxicam (MOBIC) 15 MG tablet Take 15 mg by mouth daily. (Patient not taking: Reported on 10/31/2022)   [DISCONTINUED] vitamin B-12 (CYANOCOBALAMIN) 1000 MCG tablet Take 1,000 mcg by mouth daily.   No facility-administered medications prior to visit.    Allergies  Allergen Reactions   Codeine Nausea Only   Simvastatin     Muscle ache     Patient Care Team: Birdie Sons, MD as PCP - General (Family Medicine) Rockey Situ, Kathlene November, MD as PCP - Cardiology (Cardiology) Rockey Situ Kathlene November, MD as Consulting Physician (Cardiology) Manya Silvas, MD (Inactive) (Gastroenterology) Earnie Larsson, MD as Consulting Physician (Neurosurgery) Hallows, Verlee Monte, MD (Orthopedic Surgery) Lesly Rubenstein, MD as Consulting Physician (Gastroenterology)  Review of Systems  Constitutional:  Negative for chills, diaphoresis and fever.  HENT:  Negative for congestion, ear discharge, ear pain, hearing loss, nosebleeds, sore throat and tinnitus.   Eyes:  Positive for visual disturbance. Negative for photophobia, pain, discharge and redness.  Respiratory:  Negative for cough, shortness of breath, wheezing and stridor.   Cardiovascular:  Negative for chest pain, palpitations and leg swelling.  Gastrointestinal:  Negative for abdominal pain, blood in stool, constipation, diarrhea, nausea and vomiting.  Endocrine: Negative for polydipsia.  Genitourinary:  Positive for urgency. Negative for dysuria, flank pain, frequency  and hematuria.  Musculoskeletal:  Negative for back pain, myalgias and neck pain.  Skin:  Negative for rash.  Allergic/Immunologic: Negative for environmental allergies.  Neurological:  Negative for dizziness, tremors, seizures, weakness and headaches.  Hematological:  Does not bruise/bleed easily.  Psychiatric/Behavioral:  Negative for hallucinations and suicidal ideas.  The patient is not nervous/anxious.   All other systems reviewed and are negative.   Last CBC Lab Results  Component Value Date   WBC 7.1 10/16/2021   HGB 14.5 10/16/2021   HCT 43.0 10/16/2021   MCV 97 10/16/2021   MCH 32.8 10/16/2021   RDW 12.6 10/16/2021   PLT 260 62/69/4854   Last metabolic panel Lab Results  Component Value Date   GLUCOSE 114 (H) 10/16/2021   NA 140 10/16/2021   K 4.8 10/16/2021   CL 105 10/16/2021   CO2 22 10/16/2021   BUN 23 10/16/2021   CREATININE 1.16 10/16/2021   EGFR 69 10/16/2021   CALCIUM 9.5 10/16/2021   PROT 7.1 10/16/2021   ALBUMIN 4.0 10/16/2021   LABGLOB 3.1 10/16/2021   AGRATIO 1.3 10/16/2021   BILITOT 0.6 10/16/2021   ALKPHOS 99 10/16/2021   AST 25 10/16/2021   ALT 44 10/16/2021   ANIONGAP 9 08/30/2020   Last lipids Lab Results  Component Value Date   CHOL 129 10/16/2021   HDL 29 (L) 10/16/2021   LDLCALC 64 10/16/2021   TRIG 217 (H) 10/16/2021   CHOLHDL 4.4 10/16/2021   Last thyroid functions Lab Results  Component Value Date   TSH 2.270 12/13/2015   T4TOTAL 6.8 12/13/2015       Objective    Vitals: BP 127/80 (BP Location: Left Arm, Patient Position: Sitting, Cuff Size: Large)   Pulse 67   Temp 98.6 F (37 C) (Temporal)   Resp 16   Ht '5\' 9"'$  (1.753 m)   Wt 243 lb 8 oz (110.5 kg)   BMI 35.96 kg/m  BP Readings from Last 3 Encounters:  10/31/22 127/80  11/20/21 126/81  10/13/21 (!) 149/82   Wt Readings from Last 3 Encounters:  10/31/22 243 lb 8 oz (110.5 kg)  11/20/21 241 lb (109.3 kg)  10/13/21 241 lb (109.3 kg)    Physical Exam  General Appearance:    Mildly obese male. Alert, cooperative, in no acute distress, appears stated age  Head:    Normocephalic, without obvious abnormality, atraumatic  Eyes:    PERRL, conjunctiva/corneas clear, EOM's intact, fundi    benign, both eyes       Ears:    Normal TM's and external ear canals, both ears  Nose:   Nares normal, septum midline, mucosa normal, no  drainage   or sinus tenderness  Throat:   Lips, mucosa, and tongue normal; teeth and gums normal  Neck:   Supple, symmetrical, trachea midline, no adenopathy;       thyroid:  No enlargement/tenderness/nodules; no carotid   bruit or JVD  Back:     Symmetric, no curvature, ROM normal, no CVA tenderness  Lungs:     Clear to auscultation bilaterally, respirations unlabored  Chest wall:    No tenderness or deformity  Heart:    Normal heart rate. Normal rhythm. No murmurs, rubs, or gallops.  S1 and S2 normal  Abdomen:     Soft, non-tender, bowel sounds active all four quadrants,    no masses, no organomegaly  Genitalia:    deferred  Rectal:    deferred  Extremities:   All extremities are intact.  No cyanosis or edema  Pulses:   2+ and symmetric all extremities  Skin:   Skin color, texture, turgor normal, no rashes or lesions  Lymph nodes:   Cervical, supraclavicular, and axillary nodes normal  Neurologic:   CNII-XII intact. Normal strength, sensation and reflexes      throughout      Assessment & Plan     Annual wellness visit done today including the all of the following: Reviewed patient's Family Medical History Reviewed and updated list of patient's medical providers Assessment of cognitive impairment was done Assessed patient's functional ability Established a written schedule for health screening services Health Risk Assessent Completed and Reviewed  Exercise Activities and Dietary recommendations  Goals   None     Immunization History  Administered Date(s) Administered   Fluad Quad(high Dose 65+) 07/06/2019, 09/20/2020, 10/13/2021   Influenza,inj,Quad PF,6+ Mos 07/14/2018   Pneumococcal Conjugate-13 07/06/2019   Pneumococcal Polysaccharide-23 01/14/2017, 10/13/2021   Td 02/18/2018   Tdap 12/05/2007   Zoster Recombinat (Shingrix) 07/06/2019   Zoster, Live 12/09/2015    Health Maintenance  Topic Date Due   COVID-19 Vaccine (1) Never done   Zoster Vaccines- Shingrix  (2 of 2) 08/31/2019   INFLUENZA VACCINE  05/08/2022   Lung Cancer Screening  09/06/2022   Medicare Annual Wellness (AWV)  10/13/2022   COLONOSCOPY (Pts 45-51yr Insurance coverage will need to be confirmed)  11/08/2022   DTaP/Tdap/Td (3 - Td or Tdap) 02/19/2028   Pneumonia Vaccine 67 Years old  Completed   Hepatitis C Screening  Completed   HPV VACCINES  Aged Out     Discussed health benefits of physical activity, and encouraged him to engage in regular exercise appropriate for his age and condition.     2. Prescription for Shingrix. Vaccine not administered in office.   - Zoster Vaccine Adjuvanted ([facility] injection; Inject 0.5 mLs into the muscle once for 1 dose.  Dispense: 0.5 mL; Refill: 0 #2  3. Prostate cancer screening  - PSA Total (Reflex To Free) ([facility] only)  4. History of smoking 30 or more pack years  - Ambulatory Referral for Lung Cancer Screening [REF832]  5. Need for influenza vaccination  - Flu Vaccine QUAD High Dose(Fluad)  6. Primary hypertension Well controlled.  Continue current medications.    7. Mixed hyperlipidemia He is tolerating atorvastatin and ezetimibe well with no adverse effects.   - CBC - Comprehensive metabolic panel - Lipid panel - TSH  8. Morbid obesity ([place] Encourage increasing physical activity and healthy diet  9. Atherosclerosis of native coronary artery of native heart with stable angina pectoris (HCC) Asymptomatic. Compliant with medication.  Continue aggressive risk factor modification.    10. Peripheral artery disease ([place] He does report that legs and feet get very cold when sitting in recliner, although no over claudication. He is going to discuss this with Dr. GRockey Situnext week.   11. Abdominal aortic aneurysm (AAA) without rupture, unspecified part ([place] (Borderline per u/s 12/21) has quit smoking. BP is under control. Will schedule aortic ultrasound when due 12/24     The entirety of the information documented  in the History of Present Illness, Review of Systems and Physical Exam were personally obtained by me. Portions of this information were initially documented by the CMA and reviewed by me for thoroughness and accuracy.     DLelon Huh MD  [phone](phone) [phone](fax)  [place]

## 2022-11-01 LAB — COMPREHENSIVE METABOLIC PANEL
ALT: 42 IU/L (ref 0–44)
AST: 29 IU/L (ref 0–40)
Albumin/Globulin Ratio: 1.4 (ref 1.2–2.2)
Albumin: 4.3 g/dL (ref 3.9–4.9)
Alkaline Phosphatase: 96 IU/L (ref 44–121)
BUN/Creatinine Ratio: 14 (ref 10–24)
BUN: 15 mg/dL (ref 8–27)
Bilirubin Total: 0.6 mg/dL (ref 0.0–1.2)
CO2: 21 mmol/L (ref 20–29)
Calcium: 9.2 mg/dL (ref 8.6–10.2)
Chloride: 101 mmol/L (ref 96–106)
Creatinine, Ser: 1.07 mg/dL (ref 0.76–1.27)
Globulin, Total: 3.1 g/dL (ref 1.5–4.5)
Glucose: 114 mg/dL — ABNORMAL HIGH (ref 70–99)
Potassium: 4.9 mmol/L (ref 3.5–5.2)
Sodium: 139 mmol/L (ref 134–144)
Total Protein: 7.4 g/dL (ref 6.0–8.5)
eGFR: 76 mL/min/{1.73_m2} (ref >59)

## 2022-11-01 LAB — LIPID PANEL
Chol/HDL Ratio: 3.4 ratio (ref 0.0–5.0)
Cholesterol, Total: 101 mg/dL (ref 100–199)
HDL: 30 mg/dL — ABNORMAL LOW (ref >39)
LDL Chol Calc (NIH): 48 mg/dL (ref 0–99)
Triglycerides: 126 mg/dL (ref 0–149)
VLDL Cholesterol Cal: 23 mg/dL (ref 5–40)

## 2022-11-01 LAB — CBC
Hematocrit: 48.3 % (ref 37.5–51.0)
Hemoglobin: 15.2 g/dL (ref 13.0–17.7)
MCH: 30.5 pg (ref 26.6–33.0)
MCHC: 31.5 g/dL (ref 31.5–35.7)
MCV: 97 fL (ref 79–97)
Platelets: 274 10*3/uL (ref 150–450)
RBC: 4.99 x10E6/uL (ref 4.14–5.80)
RDW: 12.4 % (ref 11.6–15.4)
WBC: 7.2 10*3/uL (ref 3.4–10.8)

## 2022-11-01 LAB — TSH: TSH: 2.7 u[IU]/mL (ref 0.450–4.500)

## 2022-11-01 LAB — PSA TOTAL (REFLEX TO FREE): Prostate Specific Ag, Serum: 1 ng/mL (ref 0.0–4.0)

## 2022-11-02 NOTE — Addendum Note (Signed)
Addended by: Birdie Sons on: 11/02/2022 02:29 PM   Modules accepted: Level of Service

## 2022-11-03 NOTE — Progress Notes (Unsigned)
Patient ID: Patrick Barrera, male   DOB: [birth year] Barrera, 67 y.o.   MRN: [id number]   Cardiology Office Note  Date:  11/05/2022   ID:  Patrick Barrera, Patrick Barrera, Patrick Barrera, MRN [id number]  PCP:  Patrick Sons, MD   Chief Complaint  Patient presents with   12 month follow up     "Doing well." Patient c/o lower leg freezing at times. Medications reviewed by the patient verbally.     HPI:  Patrick Barrera is a 67 year old gentleman with  long smoking history who stopped in October 2013,  COPD HTN,  coronary artery disease,  occlusion of his RCA in November 2007 with 2 bare-metal stents placed at that time also with 70% mid LAD lesion that was not intervened upon,  hyperlipidemia  who presents for routine followup of his coronary artery disease  Last seen by myself in clinic November 2022  Sedentary at baseline, no regular exercise program Knee replacement on left, has recovered Bad knee on right, not ready to do surgery  Feet "freeze" at night when legs are up, concerned about circulation Continues to have mild chest discomfort/shortness of breath on heavy exertion Stress test done for similar symptoms end of 2022, no significant ischemia Symptoms have not changed much since that time  Lab work reviewed Total cholesterol 101 LDL 48 Creatinine 1.07 Normal LFTs, hemoglobin 15  EKG personally reviewed by myself on todays visit Nsr rate 74 bpm unable to exclude old inferior MI  Other past medical history reviewed completed c4-5 anterior cervical fusion With Dr. Annette Stable   Lab Results  Component Value Date   CHOL 101 10/31/2022   HDL 30 (L) 10/31/2022   [place] 48 10/31/2022   TRIG 126 10/31/2022    PMH:   has a past medical history of Cholesterol blood lowered, Complex tear of lateral meniscus of right knee as current injury (03/20/2019), Complex tear of medial meniscus of right knee as current injury (03/20/2019), Coronary artery disease, Elevated lipids, GERD (gastroesophageal reflux disease),  History of Barrett's esophagus, History of rectal bleeding, Hypertension, Myocardial infarction ([place]) (11/07), and PONV (postoperative nausea and vomiting).  PSH:    Past Surgical History:  Procedure Laterality Date   ANTERIOR CERVICAL DECOMP/DISCECTOMY FUSION N/A 09/06/2020   Procedure: Anterior Cervical Decompression Discectomy Fusion Cervical four-five;  Surgeon: Earnie Larsson, MD;  Location: [place];  Service: Neurosurgery;  Laterality: N/A;   APPENDECTOMY     CARDIAC CATHETERIZATION     CARPAL TUNNEL RELEASE Left 09/22/2018   Procedure: CARPAL TUNNEL RELEASE;  Surgeon: Dereck Leep, MD;  Location: [facility];  Service: Orthopedics;  Laterality: Left;   CHONDROPLASTY Right 05/11/2019   Procedure: CHONDROPLASTY;  Surgeon: Dereck Leep, MD;  Location: [facility];  Service: Orthopedics;  Laterality: Right;   COLON SURGERY     hyperplastic colon polyp removal   COLONOSCOPY WITH PROPOFOL N/A 11/08/2017   Procedure: COLONOSCOPY WITH PROPOFOL;  Surgeon: Manya Silvas, MD;  Location: [facility];  Service: Endoscopy;  Laterality: N/A;   CORONARY STENT PLACEMENT  2007   x2   ESOPHAGOGASTRODUODENOSCOPY N/A 11/20/2021   Procedure: ESOPHAGOGASTRODUODENOSCOPY (EGD);  Surgeon: Annamaria Helling, DO;  Location: [facility];  Service: Gastroenterology;  Laterality: N/A;   ESOPHAGOGASTRODUODENOSCOPY (EGD) WITH PROPOFOL N/A 11/08/2017   Procedure: ESOPHAGOGASTRODUODENOSCOPY (EGD) WITH PROPOFOL;  Surgeon: Manya Silvas, MD;  Location[facility] ENDOSCOPY;  Service: Endoscopy;  Laterality: N/A;   GANGLION CYST EXCISION Right    KNEE ARTHROSCOPY Right 05/11/2019   Procedure: ARTHROSCOPY  KNEE- partial medial/lateral menisectomy ;  Surgeon: Dereck Leep, MD;  Location: [facility];  Service: Orthopedics;  Laterality: Right;   TONSILLECTOMY AND ADENOIDECTOMY     UPPER GI ENDOSCOPY  04/23/2014   barretts esophagus, repeat 04/2017    Current Outpatient Medications  Medication Sig Dispense Refill    acetaminophen (TYLENOL) 500 MG tablet Take 1,000 mg by mouth every 6 (six) hours as needed for moderate pain or headache.      aspirin EC 81 MG tablet Take 1 tablet (81 mg total) by mouth daily. Swallow whole. 90 tablet 3   atorvastatin (LIPITOR) 80 MG tablet TAKE 1 TABLET EVERY DAY 60 tablet 1   clopidogrel (PLAVIX) 75 MG tablet TAKE 1 TABLET EVERY DAY 60 tablet 1   diphenhydrAMINE (BENADRYL) 50 MG tablet Take 50 mg by mouth at bedtime.     esomeprazole (NEXIUM) 20 MG capsule Take 20 mg by mouth daily at 12 noon.     ezetimibe (ZETIA) 10 MG tablet TAKE 1 TABLET EVERY DAY 60 tablet 1   lisinopril (ZESTRIL) 10 MG tablet TAKE 1 TABLET EVERY DAY (NEED MD APPOINTMENT) 60 tablet 1   metoprolol succinate (TOPROL-XL) 25 MG 24 hr tablet Take 1 tablet (25 mg total) by mouth daily. Additional refill available at office visit. 14 tablet 0   nitroGLYCERIN (NITROSTAT) 0.4 MG SL tablet DISSOLVE 1 TABLET UNDER TONGUE EVERY 5 MIN AS NEED FOR CHEST PAIN. CALL 911 IF NO RELIEF AFTER 3DOSE 25 tablet 0   No current facility-administered medications for this visit.     Allergies:   Codeine and Simvastatin   Social History:  The patient  reports that he quit smoking about 10 years ago. His smoking use included cigarettes and e-cigarettes. He has a 45.00 pack-year smoking history. He has never used smokeless tobacco. He reports current alcohol use of about 1.0 standard drink of alcohol per week. He reports that he does not use drugs.   Family History:   family history includes Heart disease in his father; Pulmonary fibrosis in his father.    Review of Systems: Review of Systems  Constitutional: Negative.   Respiratory: Negative.    Cardiovascular: Negative.        Legs are cold at night when elevated  Gastrointestinal: Negative.   Musculoskeletal: Negative.   Neurological: Negative.   Psychiatric/Behavioral: Negative.    All other systems reviewed and are negative.   PHYSICAL EXAM: VS:  BP 130/70 (BP  Location: Left Arm, Patient Position: Sitting, Cuff Size: Normal)   Pulse 74   Ht '5\' 9"'$  (1.753 m)   Wt 244 lb 8 oz (110.9 kg)   SpO2 97%   BMI 36.11 kg/m  , BMI Body mass index is 36.11 kg/m. Constitutional:  oriented to person, place, and time. No distress.  HENT:  Head: Grossly normal Eyes:  no discharge. No scleral icterus.  Neck: No JVD, no carotid bruits  Cardiovascular: Regular rate and rhythm, no murmurs appreciated Pulmonary/Chest: Clear to auscultation bilaterally, no wheezes or rails Abdominal: Soft.  no distension.  no tenderness.  Musculoskeletal: Normal range of motion Neurological:  normal muscle tone. Coordination normal. No atrophy Skin: Skin warm and dry Psychiatric: normal affect, pleasant   Recent Labs: 10/31/2022: ALT 42; BUN 15; Creatinine, Ser 1.07; Hemoglobin 15.2; Platelets 274; Potassium 4.9; Sodium 139; TSH 2.700    Lipid Panel Lab Results  Component Value Date   CHOL 101 10/31/2022   HDL 30 (L) 10/31/2022   LDLCALC 48 10/31/2022  TRIG 126 10/31/2022      Wt Readings from Last 3 Encounters:  11/05/22 244 lb 8 oz (110.9 kg)  10/31/22 243 lb 8 oz (110.5 kg)  11/20/21 241 lb (109.3 kg)      ASSESSMENT AND PLAN:   Atherosclerosis of native coronary artery of native heart without angina pectoris -  Mild chest tightness shortness of breath on heavy exertion, same as November 2022 discussion Stress test that time with no high risk ischemia Recommend he continue current medications, we will start Imdur 30  Essential hypertension - Plan: EKG 12-Lead Blood pressure is well controlled on today's visit.  Adding isosorbide 30 daily to regiment for chest tightness/shortness of breath on heavy exertion  SMOKER Stop smoking several years ago, 2013 mild COPD Not on inhalers  Hyperlipidemia Cholesterol is at goal on the current lipid regimen. No changes to the medications were made.  Leg pain Legs are very cold when elevated at nighttime, unable  to exclude claudication type symptoms Lower extremity ABI, arterial Dopplers ordered    Total encounter time more than 30 minutes  Greater than 50% was spent in counseling and coordination of care with the patient    Orders Placed This Encounter  Procedures   EKG 12-Lead     Signed, Esmond Plants, M.D., Ph.D. 11/05/2022  [place], [place]

## 2022-11-05 ENCOUNTER — Ambulatory Visit: Payer: Medicare Other | Attending: Cardiovascular Disease | Admitting: Cardiovascular Disease

## 2022-11-05 ENCOUNTER — Encounter: Payer: Self-pay | Admitting: Cardiovascular Disease

## 2022-11-05 VITALS — BP 130/70 | HR 74 | Ht 69.0 in | Wt 244.5 lb

## 2022-11-05 DIAGNOSIS — Z87891 Personal history of nicotine dependence: Secondary | ICD-10-CM | POA: Insufficient documentation

## 2022-11-05 DIAGNOSIS — I714 Abdominal aortic aneurysm, without rupture, unspecified: Secondary | ICD-10-CM | POA: Insufficient documentation

## 2022-11-05 DIAGNOSIS — E782 Mixed hyperlipidemia: Secondary | ICD-10-CM | POA: Insufficient documentation

## 2022-11-05 DIAGNOSIS — I739 Peripheral vascular disease, unspecified: Secondary | ICD-10-CM | POA: Insufficient documentation

## 2022-11-05 DIAGNOSIS — I25118 Atherosclerotic heart disease of native coronary artery with other forms of angina pectoris: Secondary | ICD-10-CM | POA: Diagnosis not present

## 2022-11-05 DIAGNOSIS — I10 Essential (primary) hypertension: Secondary | ICD-10-CM | POA: Insufficient documentation

## 2022-11-05 MED ORDER — ATORVASTATIN CALCIUM 80 MG PO TABS: 80.0000 mg | ORAL_TABLET | Freq: Every day | ORAL | 2 refills | Status: DC

## 2022-11-05 MED ORDER — CLOPIDOGREL BISULFATE 75 MG PO TABS: 75.0000 mg | ORAL_TABLET | Freq: Every day | ORAL | 2 refills | Status: DC

## 2022-11-05 MED ORDER — METOPROLOL SUCCINATE ER 25 MG PO TB24: 25.0000 mg | ORAL_TABLET | Freq: Every day | ORAL | 2 refills | Status: DC

## 2022-11-05 MED ORDER — LISINOPRIL 10 MG PO TABS: ORAL_TABLET | ORAL | 2 refills | Status: DC

## 2022-11-05 MED ORDER — ISOSORBIDE MONONITRATE ER 30 MG PO TB24: 30.0000 mg | ORAL_TABLET | Freq: Every day | ORAL | 3 refills | Status: DC

## 2022-11-05 MED ORDER — EZETIMIBE 10 MG PO TABS: 10.0000 mg | ORAL_TABLET | Freq: Every day | ORAL | 2 refills | Status: DC

## 2022-11-05 NOTE — Patient Instructions (Addendum)
LE arterial u/s, and ABIs for claudication  Medication Instructions:  Isosorbide 30 mg daily got chest tightness  90 day refills  If you need a refill on your cardiac medications before your next appointment, please call your pharmacy.   Lab work: No new labs needed  Testing/Procedures: Your physician has requested that you have a lower extremity arterial duplex. During this test ultrasound is used to evaluate arterial blood flow in the legs. Allow one hour for this exam. There are no restrictions or special instructions.  Your physician has requested that you have an ankle brachial index (ABI). During this test an ultrasound and blood pressure cuff are used to evaluate the arteries that supply the arms and legs with blood. Allow thirty minutes for this exam. There are no restrictions or special instructions.   Follow-Up: At [facility], you and your health needs are our priority.  As part of our continuing mission to provide you with exceptional heart care, we have created designated Provider Care Teams.  These Care Teams include your primary Cardiologist (physician) and Advanced Practice Providers (APPs -  Physician Assistants and Nurse Practitioners) who all work together to provide you with the care you need, when you need it.  You will need a follow up appointment in 12 months  Providers on your designated Care Team:   Murray Hodgkins, NP Christell Faith, PA-C Cadence Kathlen Mody, [place]  COVID-19 Vaccine Information can be found at: [email or web address] For questions related to vaccine distribution or appointments, please email [email or web address] or call [phone].

## 2022-11-18 ENCOUNTER — Other Ambulatory Visit: Payer: Self-pay | Admitting: Cardiovascular Disease

## 2022-12-26 ENCOUNTER — Encounter: Payer: Self-pay | Admitting: Ophthalmology

## 2023-01-01 NOTE — Discharge Instructions (Signed)

## 2023-01-02 ENCOUNTER — Ambulatory Visit: Payer: Medicare Other | Admitting: Anesthesiology

## 2023-01-02 ENCOUNTER — Encounter
Admission: RE | Disposition: A | Discharge status: Home / Self Care | Payer: Self-pay | Source: Home / Self Care | Attending: Ophthalmology

## 2023-01-02 ENCOUNTER — Other Ambulatory Visit: Payer: Self-pay

## 2023-01-02 ENCOUNTER — Encounter: Payer: Self-pay | Admitting: Ophthalmology

## 2023-01-02 ENCOUNTER — Ambulatory Visit
Admission: RE | Admit: 2023-01-02 | Discharge: 2023-01-02 | Disposition: A | Discharge status: Home / Self Care | Payer: Medicare Other | Attending: Ophthalmology | Admitting: Ophthalmology

## 2023-01-02 DIAGNOSIS — K2270 Barrett's esophagus without dysplasia: Secondary | ICD-10-CM | POA: Diagnosis not present

## 2023-01-02 DIAGNOSIS — I10 Essential (primary) hypertension: Secondary | ICD-10-CM | POA: Insufficient documentation

## 2023-01-02 DIAGNOSIS — H2511 Age-related nuclear cataract, right eye: Secondary | ICD-10-CM | POA: Insufficient documentation

## 2023-01-02 DIAGNOSIS — I252 Old myocardial infarction: Secondary | ICD-10-CM | POA: Diagnosis not present

## 2023-01-02 DIAGNOSIS — E669 Obesity, unspecified: Secondary | ICD-10-CM | POA: Diagnosis not present

## 2023-01-02 DIAGNOSIS — Z6835 Body mass index (BMI) 35.0-35.9, adult: Secondary | ICD-10-CM | POA: Diagnosis not present

## 2023-01-02 DIAGNOSIS — Z955 Presence of coronary angioplasty implant and graft: Secondary | ICD-10-CM | POA: Insufficient documentation

## 2023-01-02 DIAGNOSIS — Z87891 Personal history of nicotine dependence: Secondary | ICD-10-CM | POA: Diagnosis not present

## 2023-01-02 DIAGNOSIS — M1990 Unspecified osteoarthritis, unspecified site: Secondary | ICD-10-CM | POA: Insufficient documentation

## 2023-01-02 DIAGNOSIS — K219 Gastro-esophageal reflux disease without esophagitis: Secondary | ICD-10-CM | POA: Diagnosis not present

## 2023-01-02 DIAGNOSIS — I2510 Atherosclerotic heart disease of native coronary artery without angina pectoris: Secondary | ICD-10-CM | POA: Diagnosis not present

## 2023-01-02 DIAGNOSIS — I739 Peripheral vascular disease, unspecified: Secondary | ICD-10-CM | POA: Insufficient documentation

## 2023-01-02 DIAGNOSIS — Z7902 Long term (current) use of antithrombotics/antiplatelets: Secondary | ICD-10-CM | POA: Insufficient documentation

## 2023-01-02 HISTORY — DX: Presence of dental prosthetic device (complete) (partial): Z97.2

## 2023-01-02 HISTORY — PX: CATARACT EXTRACTION W/PHACO: SHX586

## 2023-01-02 SURGERY — PHACOEMULSIFICATION, CATARACT, WITH IOL INSERTION
Anesthesia: Monitor Anesthesia Care | Site: Eye | Laterality: Right

## 2023-01-02 MED ORDER — SIGHTPATH DOSE#1 BSS IO SOLN
INTRAOCULAR | Status: DC | PRN
  Administered 2023-01-02: 1 mL

## 2023-01-02 MED ORDER — SIGHTPATH DOSE#1 BSS IO SOLN
INTRAOCULAR | Status: DC | PRN
  Administered 2023-01-02: 15 mL

## 2023-01-02 MED ORDER — SIGHTPATH DOSE#1 BSS IO SOLN
INTRAOCULAR | Status: DC | PRN
  Administered 2023-01-02: 60 mL via OPHTHALMIC

## 2023-01-02 MED ORDER — LACTATED RINGERS IV SOLN: INTRAVENOUS | Status: DC

## 2023-01-02 MED ORDER — FENTANYL CITRATE (PF) 100 MCG/2ML IJ SOLN
INTRAMUSCULAR | Status: DC | PRN
  Administered 2023-01-02: 50 ug via INTRAVENOUS

## 2023-01-02 MED ORDER — ARMC OPHTHALMIC DILATING DROPS
1.0000 | OPHTHALMIC | Status: DC | PRN
  Administered 2023-01-02 (×3): 1 via OPHTHALMIC

## 2023-01-02 MED ORDER — ONDANSETRON HCL 4 MG/2ML IJ SOLN
INTRAMUSCULAR | Status: DC | PRN
  Administered 2023-01-02: 4 mg via INTRAVENOUS

## 2023-01-02 MED ORDER — SIGHTPATH DOSE#1 NA HYALUR & NA CHOND-NA HYALUR IO KIT
PACK | INTRAOCULAR | Status: DC | PRN
  Administered 2023-01-02: 1 via OPHTHALMIC

## 2023-01-02 MED ORDER — CEFUROXIME OPHTHALMIC INJECTION 1 MG/0.1 ML
INJECTION | OPHTHALMIC | Status: DC | PRN
  Administered 2023-01-02: .1 mL via INTRACAMERAL

## 2023-01-02 MED ORDER — TETRACAINE HCL 0.5 % OP SOLN
1.0000 [drp] | OPHTHALMIC | Status: DC | PRN
  Administered 2023-01-02 (×3): 1 [drp] via OPHTHALMIC

## 2023-01-02 MED ORDER — BRIMONIDINE TARTRATE-TIMOLOL 0.2-0.5 % OP SOLN
OPHTHALMIC | Status: DC | PRN
  Administered 2023-01-02: 1 [drp] via OPHTHALMIC

## 2023-01-02 MED ORDER — MIDAZOLAM HCL 2 MG/2ML IJ SOLN
INTRAMUSCULAR | Status: DC | PRN
  Administered 2023-01-02: 2 mg via INTRAVENOUS

## 2023-01-02 SURGICAL SUPPLY — 21 items
CANNULA ANT/CHMB 27G (MISCELLANEOUS) IMPLANT
CANNULA ANT/CHMB 27GA (MISCELLANEOUS) IMPLANT
CATARACT SUITE SIGHTPATH (MISCELLANEOUS) ×1 IMPLANT
FEE CATARACT SUITE SIGHTPATH (MISCELLANEOUS) ×2 IMPLANT
GLOVE SRG 8 PF TXTR STRL LF DI (GLOVE) ×2 IMPLANT
GLOVE SURG ENC TEXT LTX SZ7.5 (GLOVE) ×2 IMPLANT
GLOVE SURG GAMMEX PI TX LF 7.5 (GLOVE) IMPLANT
GLOVE SURG UNDER POLY LF SZ8 (GLOVE) ×1
LENS CLAREON TRC 13.5 ×1 IMPLANT
LENS IOL CLRN TRC 7 13.5 IMPLANT
NDL FILTER BLUNT 18X1 1/2 (NEEDLE) ×2 IMPLANT
NDL RETROBULBAR .5 NSTRL (NEEDLE) IMPLANT
NEEDLE FILTER BLUNT 18X1 1/2 (NEEDLE) ×1 IMPLANT
PACK VIT ANT 23G (MISCELLANEOUS) IMPLANT
RING MALYGIN 7.0 (MISCELLANEOUS) IMPLANT
SUT ETHILON 10-0 CS-B-6CS-B-6 (SUTURE)
SUT VICRYL  9 0 (SUTURE)
SUT VICRYL 9 0 (SUTURE) IMPLANT
SUTURE EHLN 10-0 CS-B-6CS-B-6 (SUTURE) IMPLANT
SYR 3ML LL SCALE MARK (SYRINGE) ×2 IMPLANT
WATER STERILE IRR 250ML POUR (IV SOLUTION) ×2 IMPLANT

## 2023-01-02 NOTE — Transfer of Care (Signed)
Immediate Anesthesia Transfer of Care Note  Patient: Patrick Barrera  Procedure(s) Performed: CATARACT EXTRACTION PHACO AND INTRAOCULAR LENS PLACEMENT (IOC) RIGHT (Right: Eye)  Patient Location: PACU  Anesthesia Type: MAC  Level of Consciousness: awake, alert  and patient cooperative  Airway and Oxygen Therapy: Patient Spontanous Breathing and Patient connected to supplemental oxygen  Post-op Assessment: Post-op Vital signs reviewed, Patient's Cardiovascular Status Stable, Respiratory Function Stable, Patent Airway and No signs of Nausea or vomiting  Post-op Vital Signs: Reviewed and stable  Complications: No notable events documented.

## 2023-01-02 NOTE — Anesthesia Preprocedure Evaluation (Addendum)
Anesthesia Evaluation  Patient identified by MRN, date of birth, ID band Patient awake    Reviewed: Allergy & Precautions, NPO status , Patient's Chart, lab work & pertinent test results  History of Anesthesia Complications (+) PONV and history of anesthetic complications  Airway Mallampati: IV   Neck ROM: Full    Dental  (+) [place] :   Pulmonary former smoker (quit 2013)   Pulmonary exam normal breath sounds clear to auscultation       Cardiovascular hypertension, + CAD (s/p MI and stents on Plavix) and + Peripheral Vascular Disease  Normal cardiovascular exam Rhythm:Regular Rate:Normal     Neuro/Psych negative neurological ROS     GI/Hepatic ,GERD (Barrett esophagus)  ,,  Endo/Other  Obesity   Renal/GU negative Renal ROS     Musculoskeletal  (+) Arthritis ,    Abdominal   Peds  Hematology negative hematology ROS (+)   Anesthesia Other Findings   Reproductive/Obstetrics                             Anesthesia Physical Anesthesia Plan  ASA: 3  Anesthesia Plan: MAC   Post-op Pain Management:    Induction: Intravenous  PONV Risk Score and Plan: 2 and Treatment may vary due to age or medical condition, Midazolam and TIVA  Airway Management Planned: Natural Airway and Nasal Cannula  Additional Equipment:   Intra-op Plan:   Post-operative Plan:   Informed Consent: I have reviewed the patients History and Physical, chart, labs and discussed the procedure including the risks, benefits and alternatives for the proposed anesthesia with the patient or authorized representative who has indicated his/her understanding and acceptance.     Dental advisory given  Plan Discussed with: CRNA  Anesthesia Plan Comments: (LMA/GETA backup discussed.  Patient consented for risks of anesthesia including but not limited to:  - adverse reactions to medications - damage to eyes, teeth,  lips or other oral mucosa - nerve damage due to positioning  - sore throat or hoarseness - damage to heart, brain, nerves, lungs, other parts of body or loss of life  Informed patient about role of CRNA in peri- and intra-operative care.  Patient voiced understanding.)        Anesthesia Quick Evaluation

## 2023-01-02 NOTE — H&P (Signed)
[facility]   Primary Care Physician:  Birdie Sons, MD Ophthalmologist: Dr. Leandrew Koyanagi  Pre-Procedure History & Physical: HPI:  Patrick Barrera is a 67 y.o. male here for ophthalmic surgery.   Past Medical History:  Diagnosis Date   Cholesterol blood lowered    Complex tear of lateral meniscus of right knee as current injury 03/20/2019   Complex tear of medial meniscus of right knee as current injury 03/20/2019   Coronary artery disease    Dental bridge present    permanent.  Bottom front   Elevated lipids    GERD (gastroesophageal reflux disease)    History of Barrett's esophagus    History of rectal bleeding    Hypertension    Myocardial infarction ([place]) 08/2006   With occlusion of RCA, stents x2   PONV (postoperative nausea and vomiting)     Past Surgical History:  Procedure Laterality Date   ANTERIOR CERVICAL DECOMP/DISCECTOMY FUSION N/A 09/06/2020   Procedure: Anterior Cervical Decompression Discectomy Fusion Cervical four-five;  Surgeon: Earnie Larsson, MD;  Location: [place];  Service: Neurosurgery;  Laterality: N/A;   APPENDECTOMY     CARDIAC CATHETERIZATION     CARPAL TUNNEL RELEASE Left 09/22/2018   Procedure: CARPAL TUNNEL RELEASE;  Surgeon: Dereck Leep, MD;  Location: [facility];  Service: Orthopedics;  Laterality: Left;   CHONDROPLASTY Right 05/11/2019   Procedure: CHONDROPLASTY;  Surgeon: Dereck Leep, MD;  Location: [facility];  Service: Orthopedics;  Laterality: Right;   COLON SURGERY     hyperplastic colon polyp removal   COLONOSCOPY WITH PROPOFOL N/A 11/08/2017   Procedure: COLONOSCOPY WITH PROPOFOL;  Surgeon: Manya Silvas, MD;  Location: [facility] END[facility];  Service: Endoscopy;  Laterality: N/A;   CORONARY STENT PLACEMENT  2007   x2   ESOPHAGOGASTRODUODENOSCOPY N/A 11/20/2021   Procedure: ESOPHAGOGASTRODUODENOSCOPY (EGD);  Surgeon: Annamaria Helling, DO;  Location: [facility] ENDOSCOPY;  Service: Gastroenterology;  Laterality: N/A;    ESOPHAGOGASTRODUODENOSCOPY (EGD) WITH PROPOFOL N/A 11/08/2017   Procedure: ESOPHAGOGASTRODUODENOSCOPY (EGD) WITH PROPOFOL;  Surgeon: Manya Silvas, MD;  Location: [facility] ENDOSCOPY;  Service: Endoscopy;  Laterality: N/A;   GANGLION CYST EXCISION Right    KNEE ARTHROSCOPY Right 05/11/2019   Procedure: ARTHROSCOPY KNEE- partial medial/lateral menisectomy ;  Surgeon: Dereck Leep, MD;  Location: [facility];  Service: Orthopedics;  Laterality: Right;   TONSILLECTOMY AND ADENOIDECTOMY     UPPER GI ENDOSCOPY  04/23/2014   barretts esophagus, repeat 04/2017    Prior to Admission medications   Medication Sig Start Date End Date Taking? Authorizing Provider  aspirin EC 81 MG tablet Take 1 tablet (81 mg total) by mouth daily. Swallow whole. 08/23/20  Yes Minna Merritts, MD  atorvastatin (LIPITOR) 80 MG tablet Take 1 tablet (80 mg total) by mouth daily. 11/05/22  Yes Minna Merritts, MD  clopidogrel (PLAVIX) 75 MG tablet Take 1 tablet (75 mg total) by mouth daily. 11/05/22  Yes Minna Merritts, MD  diphenhydrAMINE (BENADRYL) 50 MG tablet Take 50 mg by mouth at bedtime.   Yes [provider]  esomeprazole (NEXIUM) 20 MG capsule Take 20 mg by mouth daily at 12 noon.   Yes [provider]  ezetimibe (ZETIA) 10 MG tablet Take 1 tablet (10 mg total) by mouth daily. 11/05/22  Yes Minna Merritts, MD  lisinopril (ZESTRIL) 10 MG tablet TAKE 1 TABLET EVERY DAY 11/05/22  Yes Gollan, Kathlene November, MD  metoprolol succinate (TOPROL-XL) 25 MG 24 hr tablet Take 1  tablet (25 mg total) by mouth daily. 11/05/22  Yes Minna Merritts, MD  acetaminophen (TYLENOL) 500 MG tablet Take 1,000 mg by mouth every 6 (six) hours as needed for moderate pain or headache.     [provider]  isosorbide mononitrate (IMDUR) 30 MG 24 hr tablet Take 1 tablet (30 mg total) by mouth daily. Patient not taking: Reported on 12/26/2022 11/05/22   Minna Merritts, MD  nitroGLYCERIN (NITROSTAT) 0.4 MG SL tablet  DISSOLVE 1 TABLET UNDER TONGUE EVERY 5 MIN AS NEED FOR CHEST PAIN. CALL 911 IF NO RELIEF AFTER 3DOSE 01/10/21   Minna Merritts, MD    Allergies as of 12/12/2022 - Review Complete 11/05/2022  Allergen Reaction Noted   Codeine Nausea Only 08/13/2011   Simvastatin  08/23/2021    Family History  Problem Relation Age of Onset   Heart disease Father        Stents placed   Pulmonary fibrosis Father     Social History   Socioeconomic History   Marital status: Married    Spouse name: Not on file   Number of children: 2   Years of education: Not on file   Highest education level: Not on file  Occupational History   Occupation: MACHINE OPERATOR    Employer: [employer]    Comment: Full time  Tobacco Use   Smoking status: Former    Packs/day: 1.00    Years: 45.00    Additional pack years: 0.00    Total pack years: 45.00    Types: Cigarettes, E-cigarettes    Quit date: 07/12/2012    Years since quitting: 10.4   Smokeless tobacco: Never   Tobacco comments:    changed from cigarettes to [place] in 2013. Previousl 1.5 ppd since 12  Vaping Use   Vaping Use: Every day   Start date: 10/09/2011   Substances: Nicotine  Substance and Sexual Activity   Alcohol use: Yes    Alcohol/week: 1.0 standard drink of alcohol    Types: 1 Standard drinks or equivalent per week    Comment: moderate   Drug use: No   Sexual activity: Not on file  Other Topics Concern   Not on file  Social History Narrative   No regular exercise.   Social Determinants of Health   Financial Resource Strain: Not on file  Food Insecurity: Not on file  Transportation Needs: Not on file  Physical Activity: Not on file  Stress: Not on file  Social Connections: Not on file  Intimate Partner Violence: Not on file    Review of Systems: See HPI, otherwise negative ROS  Physical Exam: BP (!) 156/109   Temp (!) 97.5 F (36.4 C) (Tympanic)   Resp 13   Ht 5' 9.02" (1.753 m)   Wt 109.1 kg   SpO2 96%   BMI  35.51 kg/m  General:   Alert,  pleasant and cooperative in NAD Head:  Normocephalic and atraumatic. Lungs:  Clear to auscultation.    Heart:  Regular rate and rhythm.   Impression/Plan: Talmage Nap is here for ophthalmic surgery.  Risks, benefits, limitations, and alternatives regarding ophthalmic surgery have been reviewed with the patient.  Questions have been answered.  All parties agreeable.   Leandrew Koyanagi, MD  01/02/2023, 11:32 AM

## 2023-01-02 NOTE — Op Note (Signed)
LOCATION:  [place]   PREOPERATIVE DIAGNOSIS:  Nuclear sclerotic cataract of the right eye.  H25.11   POSTOPERATIVE DIAGNOSIS:  Nuclear sclerotic cataract of the right eye.   PROCEDURE:  Phacoemulsification with Toric posterior chamber intraocular lens placement of the right eye.  Ultrasound time: Procedure(s) with comments: CATARACT EXTRACTION PHACO AND INTRAOCULAR LENS PLACEMENT (IOC) RIGHT (Right) - 9.73 1:06.4  LENS:   Implant Name Type Inv. Item Serial No. Manufacturer Lot No. LRB No. Used Action  LENS CLAREON TRC 13.5 - [id number]  LENS CLAREON TRC 13.5 [id number] SIGHTPATH  Right 1 Implanted     [id number] Toric intraocular lens with 4.5 diopters of cylindrical power with axis orientation at 102 degrees.   SURGEON:  Wyonia Hough, MD   ANESTHESIA: Topical with tetracaine drops and 2% Xylocaine jelly, augmented with 1% preservative-free intracameral lidocaine. .   COMPLICATIONS:  None.   DESCRIPTION OF PROCEDURE:  The patient was identified in the holding room and transported to the operating suite and placed in the supine position under the operating microscope.  The right eye was identified as the operative eye, and it was prepped and draped in the usual sterile ophthalmic fashion.    A clear-corneal paracentesis incision was made at the 12:00 position.  0.5 ml of preservative-free 1% lidocaine was injected into the anterior chamber. The anterior chamber was filled with Viscoat.  A 2.4 millimeter near clear corneal incision was then made at the 9:00 position.  A cystotome and capsulorrhexis forceps were then used to make a curvilinear capsulorrhexis.  Hydrodissection and hydrodelineation were then performed using balanced salt solution.   Phacoemulsification was then used in stop and chop fashion to remove the lens, nucleus and epinucleus.  The remaining cortex was aspirated using the irrigation and aspiration handpiece.  Provisc viscoelastic was then placed  into the capsular bag to distend it for lens placement.  The Verion digital marker was used to align the implant at the intended axis.   A Toric lens was then injected into the capsular bag.  It was rotated clockwise until the axis marks on the lens were approximately 15 degrees in the counterclockwise direction to the intended alignment.  The viscoelastic was aspirated from the eye using the irrigation aspiration handpiece.  Then, a Koch spatula through the sideport incision was used to rotate the lens in a clockwise direction until the axis markings of the intraocular lens were lined up with the Verion alignment.  Balanced salt solution was then used to hydrate the wounds. Cefuroxime 0.1 ml of a 10mg /ml solution was injected into the anterior chamber for a dose of 1 mg of intracameral antibiotic at the completion of the case.    The eye was noted to have a physiologic pressure and there was no wound leak noted.   Timolol and Brimonidine drops were applied to the eye.  The patient was taken to the recovery room in stable condition having had no complications of anesthesia or surgery.  Danicia Terhaar 01/02/2023, 12:49 PM

## 2023-01-02 NOTE — Anesthesia Postprocedure Evaluation (Signed)
Anesthesia Post Note  Patient: Patrick Barrera  Procedure(s) Performed: CATARACT EXTRACTION PHACO AND INTRAOCULAR LENS PLACEMENT (IOC) RIGHT (Right: Eye)  Patient location during evaluation: PACU Anesthesia Type: MAC Level of consciousness: awake and alert, oriented and patient cooperative Pain management: pain level controlled Vital Signs Assessment: post-procedure vital signs reviewed and stable Respiratory status: spontaneous breathing, nonlabored ventilation and respiratory function stable Cardiovascular status: blood pressure returned to baseline and stable Postop Assessment: adequate PO intake Anesthetic complications: no   No notable events documented.   Last Vitals:  Vitals:   01/02/23 1249 01/02/23 1258  BP: 118/86 (!) 140/87  Pulse: 64   Resp: 18   Temp: 36.5 C 36.6 C  SpO2: 97%     Last Pain:  Vitals:   01/02/23 1258  TempSrc:   PainSc: 0-No pain                 Darrin Nipper

## 2023-02-11 ENCOUNTER — Other Ambulatory Visit: Payer: Self-pay

## 2023-02-11 DIAGNOSIS — Z87891 Personal history of nicotine dependence: Secondary | ICD-10-CM

## 2023-02-11 DIAGNOSIS — Z122 Encounter for screening for malignant neoplasm of respiratory organs: Secondary | ICD-10-CM

## 2023-02-12 ENCOUNTER — Ambulatory Visit: Payer: Medicare Other | Attending: Cardiovascular Disease

## 2023-02-12 DIAGNOSIS — I739 Peripheral vascular disease, unspecified: Secondary | ICD-10-CM | POA: Insufficient documentation

## 2023-02-13 LAB — VAS US ABI WITH/WO TBI
Left ABI: 0.97
Right ABI: 1.04

## 2023-03-15 ENCOUNTER — Ambulatory Visit (INDEPENDENT_AMBULATORY_CARE_PROVIDER_SITE_OTHER): Payer: Medicare Other | Admitting: Primary Care

## 2023-03-15 DIAGNOSIS — Z87891 Personal history of nicotine dependence: Secondary | ICD-10-CM | POA: Diagnosis not present

## 2023-03-15 NOTE — Progress Notes (Signed)
Virtual Visit via Telephone Note  I connected with Patrick Barrera on 03/15/23 at  8:30 AM EDT by telephone and verified that I am speaking with the correct person using two identifiers.  Location: Patient: Home Provider: Office   I discussed the limitations, risks, security and privacy concerns of performing an evaluation and management service by telephone and the availability of in person appointments. I also discussed with the patient that there may be a patient responsible charge related to this service. The patient expressed understanding and agreed to proceed.    Shared Decision Making Visit Lung Cancer Screening Program [phone])   Eligibility: Age 67 y.o. Pack Years Smoking History Calculation 54 (# packs/per year x # years smoked) Recent History of coughing up blood  no Unexplained weight loss? no ( >Than 15 pounds within the last 6 months ) Prior History Lung / other cancer no (Diagnosis within the last 5 years already requiring surveillance chest CT Scans). Smoking Status Former Smoker Former Smokers: Years since quit: 11 years  Quit Date: 2013  Visit Components: Discussion included one or more decision making aids. yes Discussion included risk/benefits of screening. yes Discussion included potential follow up diagnostic testing for abnormal scans. yes Discussion included meaning and risk of over diagnosis. yes Discussion included meaning and risk of False Positives. yes Discussion included meaning of total radiation exposure. yes  Counseling Included: Importance of adherence to annual lung cancer LDCT screening. yes Impact of comorbidities on ability to participate in the program. yes Ability and willingness to under diagnostic treatment. yes  Smoking Cessation Counseling: Current Smokers:  Discussed importance of smoking cessation. no Information about tobacco cessation classes and interventions provided to patient. no Patient provided with "ticket" for LDCT  Scan. NA Symptomatic Patient. no  Counseling(Intermediate counseling: > three minutes) [id number] Diagnosis Code: Tobacco Use Z72.0 Asymptomatic Patient yes  Counseling (Intermediate counseling: > three minutes counseling) O1308 Former Smokers:  Discussed the importance of maintaining cigarette abstinence. yes Diagnosis Code: Personal History of Nicotine Dependence. M57.846 Information about tobacco cessation classes and interventions provided to patient. Yes Patient provided with "ticket" for LDCT Scan. NA Written Order for Lung Cancer Screening with LDCT placed in Epic. Yes (CT Chest Lung Cancer Screening Low Dose W/O CM) [id number] Z12.2-Screening of respiratory organs Z87.891-Personal history of nicotine dependence  I have spent 25 minutes of face to face/ virtual visit time with Patrick Barrera discussing the risks and benefits of lung cancer screening. We viewed / discussed a power point together that explained in detail the above noted topics. We paused at intervals to allow for questions to be asked and answered to ensure understanding.We discussed that the single most powerful action that he can take to decrease him risk of developing lung cancer is to quit smoking. We discussed whether or not he is ready to commit to setting a quit date. We discussed options for tools to aid in quitting smoking including nicotine replacement therapy, non-nicotine medications, support groups, Quit Smart classes, and behavior modification. We discussed that often times setting smaller, more achievable goals, such as eliminating 1 cigarette a day for a week and then 2 cigarettes a day for a week can be helpful in slowly decreasing the number of cigarettes smoked. This allows for a sense of accomplishment as well as providing a clinical benefit. I provided him with smoking cessation  information  with contact information for community resources, classes, free nicotine replacement therapy, and access to mobile apps, text  messaging, and on-line smoking  cessation help. I have also provided him the office contact information in the event he needs to contact me, or the screening staff. We discussed the time and location of the scan, and that either Doroteo Glassman RN, Joella Prince, RN  or I will call / send a letter with the results within 24-72 hours of receiving them. The patient verbalized understanding of all of  the above and had no further questions upon leaving the office. They have my contact information in the event they have any further questions.  I spent 3-5 minutes counseling on smoking cessation and the health risks of continued tobacco abuse.  I explained to the patient that there has been a high incidence of coronary artery disease noted on these exams. I explained that this is a non-gated exam therefore degree or severity cannot be determined. This patient is on statin therapy. I have asked the patient to follow-up with their PCP regarding any incidental finding of coronary artery disease and management with diet or medication as their PCP  feels is clinically indicated. The patient verbalized understanding of the above and had no further questions upon completion of the visit.   Martyn Ehrich, NP

## 2023-03-15 NOTE — Patient Instructions (Signed)
Thank you for participating in the [place] Lung Cancer Screening Program. It was our pleasure to meet you today. We will call you with the results of your scan within the next few days. Your scan will be assigned a Lung RADS category score by the physicians reading the scans.  This Lung RADS score determines follow up scanning.  See below for description of categories, and follow up screening recommendations. We will be in touch to schedule your follow up screening annually or based on recommendations of our providers. We will fax a copy of your scan results to your Primary Care Physician, or the physician who referred you to the program, to ensure they have the results. Please call the office if you have any questions or concerns regarding your scanning experience or results.  Our office number is [phone]. Please speak with Denise Phelps, RN. , or  Denise Buckner RN, They are  our Lung Cancer Screening RN.'s If They are unavailable when you call, Please leave a message on the voice mail. We will return your call at our earliest convenience.This voice mail is monitored several times a day.  Remember, if your scan is normal, we will scan you annually as long as you continue to meet the criteria for the program. (Age 50-80, Current smoker or smoker who has quit within the last 15 years). If you are a smoker, remember, quitting is the single most powerful action that you can take to decrease your risk of lung cancer and other pulmonary, breathing related problems. We know quitting is hard, and we are here to help.  Please let us know if there is anything we can do to help you meet your goal of quitting. If you are a former smoker, congratulations. We are proud of you! Remain smoke free! Remember you can refer friends or family members through the number above.  We will screen them to make sure they meet criteria for the program. Thank you for helping us take better care of you by  participating in Lung Screening.  You can receive free nicotine replacement therapy ( patches, gum or mints) by calling 1-800-QUIT NOW. Please call so we can get you on the path to becoming  a non-smoker. I know it is hard, but you can do this!  Lung RADS Categories:  Lung RADS 1: no nodules or definitely non-concerning nodules.  Recommendation is for a repeat annual scan in 12 months.  Lung RADS 2:  nodules that are non-concerning in appearance and behavior with a very low likelihood of becoming an active cancer. Recommendation is for a repeat annual scan in 12 months.  Lung RADS 3: nodules that are probably non-concerning , includes nodules with a low likelihood of becoming an active cancer.  Recommendation is for a 6-month repeat screening scan. Often noted after an upper respiratory illness. We will be in touch to make sure you have no questions, and to schedule your 6-month scan.  Lung RADS 4 A: nodules with concerning findings, recommendation is most often for a follow up scan in 3 months or additional testing based on our provider's assessment of the scan. We will be in touch to make sure you have no questions and to schedule the recommended 3 month follow up scan.  Lung RADS 4 B:  indicates findings that are concerning. We will be in touch with you to schedule additional diagnostic testing based on our provider's  assessment of the scan.  Other options for assistance in smoking cessation (   As covered by your insurance benefits)  Hypnosis for smoking cessation  [employer] [phone]  Acupuncture for smoking cessation  [facility] [phone]   

## 2023-03-18 ENCOUNTER — Ambulatory Visit
Admission: RE | Admit: 2023-03-18 | Discharge: 2023-03-18 | Disposition: A | Discharge status: Home / Self Care | Payer: Medicare Other | Source: Ambulatory Visit | Attending: Acute Care | Admitting: Acute Care

## 2023-03-18 DIAGNOSIS — Z87891 Personal history of nicotine dependence: Secondary | ICD-10-CM

## 2023-03-18 DIAGNOSIS — Z122 Encounter for screening for malignant neoplasm of respiratory organs: Secondary | ICD-10-CM | POA: Diagnosis present

## 2023-03-22 ENCOUNTER — Other Ambulatory Visit: Payer: Self-pay | Admitting: Acute Care

## 2023-03-22 ENCOUNTER — Encounter: Payer: Self-pay | Admitting: Family Medicine

## 2023-03-22 DIAGNOSIS — K5790 Diverticulosis of intestine, part unspecified, without perforation or abscess without bleeding: Secondary | ICD-10-CM | POA: Insufficient documentation

## 2023-03-22 DIAGNOSIS — Z87891 Personal history of nicotine dependence: Secondary | ICD-10-CM

## 2023-03-22 DIAGNOSIS — Z122 Encounter for screening for malignant neoplasm of respiratory organs: Secondary | ICD-10-CM

## 2023-03-22 DIAGNOSIS — K760 Fatty (change of) liver, not elsewhere classified: Secondary | ICD-10-CM | POA: Insufficient documentation

## 2023-05-27 ENCOUNTER — Encounter: Payer: Self-pay | Admitting: Ophthalmology

## 2023-05-27 ENCOUNTER — Telehealth: Payer: Self-pay | Admitting: Family Medicine

## 2023-05-27 NOTE — Telephone Encounter (Signed)
Pt is calling to request his last lab results printed off for him. Pt reports that he will be in the area tomorrow morning. Is this ok for him to pick up results then? Please advise [phone]- [phone]

## 2023-05-27 NOTE — Telephone Encounter (Signed)
Labs printed and place up front ready for pick up.

## 2023-05-27 NOTE — Anesthesia Preprocedure Evaluation (Addendum)
Anesthesia Evaluation  Patient identified by MRN, date of birth, ID band Patient awake    Reviewed: Allergy & Precautions, H&P , NPO status , Patient's Chart, lab work & pertinent test results  History of Anesthesia Complications (+) PONV and history of anesthetic complications  Airway Mallampati: IV  TM Distance: <3 FB Neck ROM: Full    Dental no notable dental hx. (+) Chipped, Poor Dentition Crooked upper teeth, especially front teeth:   Pulmonary former smoker   Pulmonary exam normal breath sounds clear to auscultation       Cardiovascular hypertension, + angina  + CAD, + Past MI and + Peripheral Vascular Disease  Normal cardiovascular exam Rhythm:Regular Rate:Normal     Neuro/Psych  Neuromuscular disease negative neurological ROS  negative psych ROS   GI/Hepatic negative GI ROS, Neg liver ROS, hiatal hernia,GERD  ,,Barretts esophagus   Endo/Other  negative endocrine ROS    Renal/GU negative Renal ROS  negative genitourinary   Musculoskeletal negative musculoskeletal ROS (+) Arthritis ,    Abdominal   Peds negative pediatric ROS (+)  Hematology negative hematology ROS (+)   Anesthesia Other Findings Coronary artery disease  Myocardial infarction (HCC) Cholesterol blood lowered  History of Barrett's esophagus GERD (gastroesophageal reflux disease) History of rectal bleeding Elevated lipids Hypertension Complex tear of medial meniscus of right knee as current injury  Complex tear of lateral meniscus of right knee as current injury PONV (postoperative nausea and vomiting) Dental bridge present Previous cataract surgery 01-02-23 Dr. Ronni Rumble   Reproductive/Obstetrics negative OB ROS                             Anesthesia Physical Anesthesia Plan  ASA: 3  Anesthesia Plan: MAC   Post-op Pain Management:    Induction: Intravenous  PONV Risk Score and Plan:   Airway  Management Planned: Natural Airway and Nasal Cannula  Additional Equipment:   Intra-op Plan:   Post-operative Plan:   Informed Consent: I have reviewed the patients History and Physical, chart, labs and discussed the procedure including the risks, benefits and alternatives for the proposed anesthesia with the patient or authorized representative who has indicated his/her understanding and acceptance.     Dental Advisory Given  Plan Discussed with: Anesthesiologist, CRNA and Surgeon  Anesthesia Plan Comments: (Patient consented for risks of anesthesia including but not limited to:  - adverse reactions to medications - damage to eyes, teeth, lips or other oral mucosa - nerve damage due to positioning  - sore throat or hoarseness - Damage to heart, brain, nerves, lungs, other parts of body or loss of life  Patient voiced understanding.)        Anesthesia Quick Evaluation

## 2023-05-30 IMAGING — CT CT CHEST W/O CM
2 of 4 series · 15 of 36 positions shown, 18 images · non-contrast
Comparison: None.

CLINICAL DATA: Lung nodule.  Dyspnea on exertion.

EXAM:
CT CHEST WITHOUT CONTRAST
TECHNIQUE: Multidetector CT imaging of the chest was performed following the
standard protocol without IV contrast.

[Series 2: chest 2.00 · axial · 0.74mm/px · z∈[-1238,-972]mm · 12 of 159 slices shown, 15 images]
[im 13/159  mediastinal]
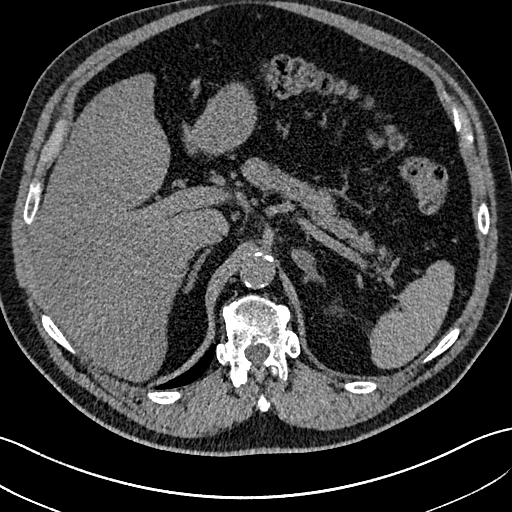
[im 13/159  lung]
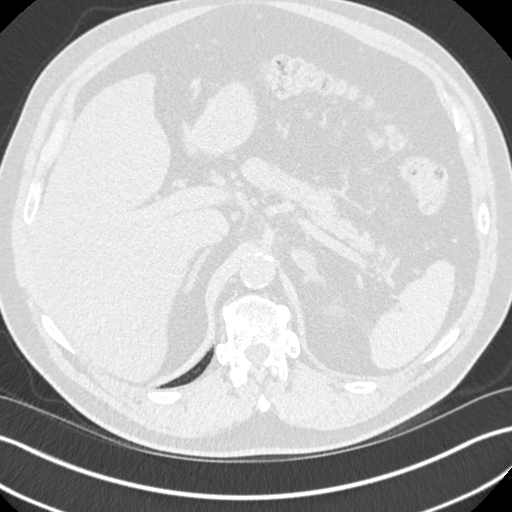
[im 25/159  lung]
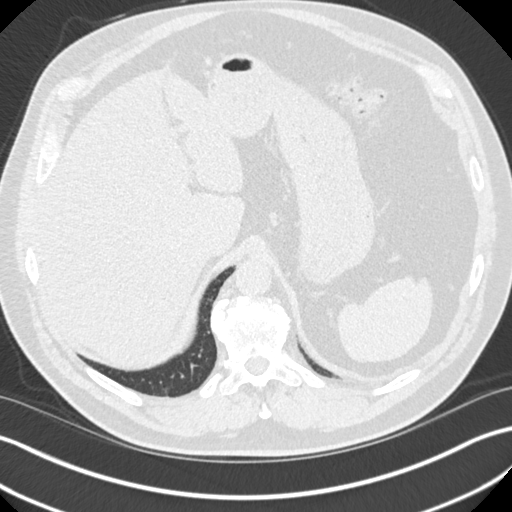
[im 37/159  lung]
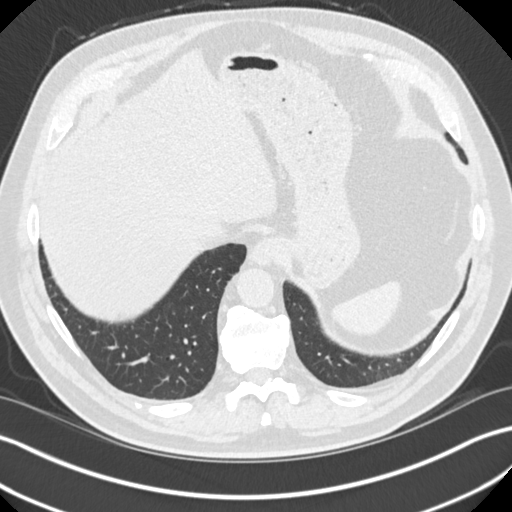
[im 49/159  lung]
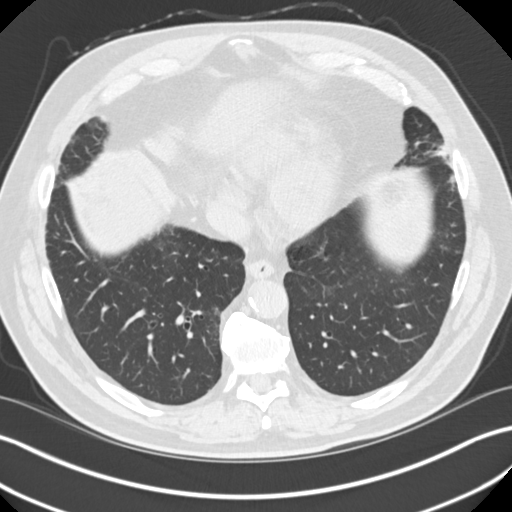
[im 61/159  mediastinal]
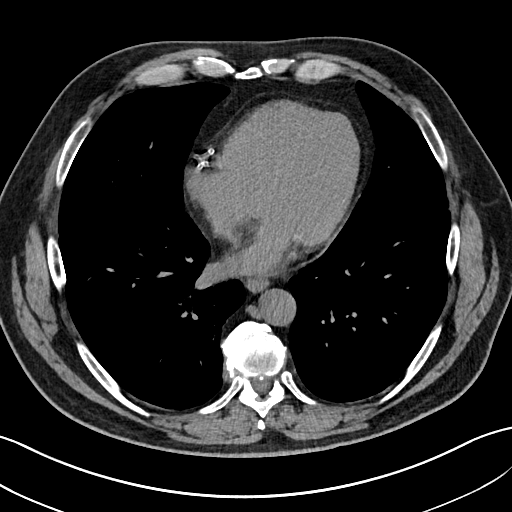
[im 61/159  lung]
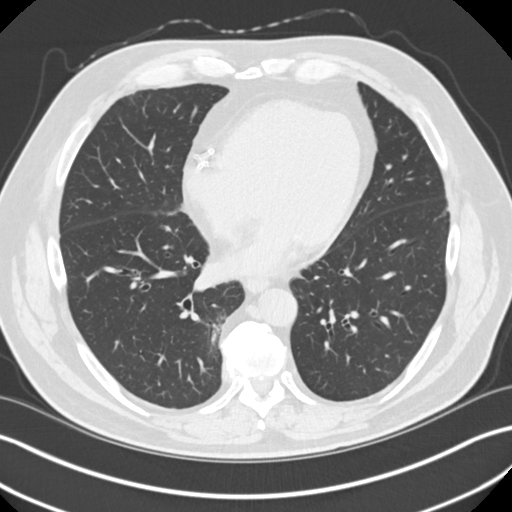
[im 73/159  lung]
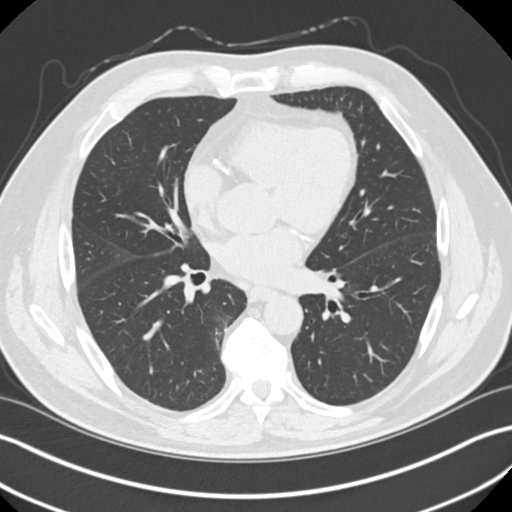
[im 86/159  lung]
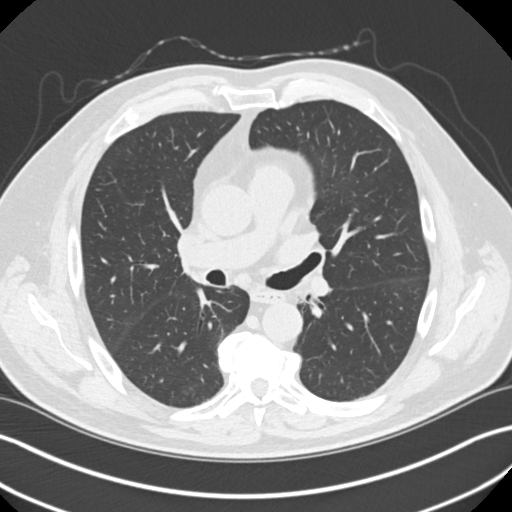
[im 98/159  lung]
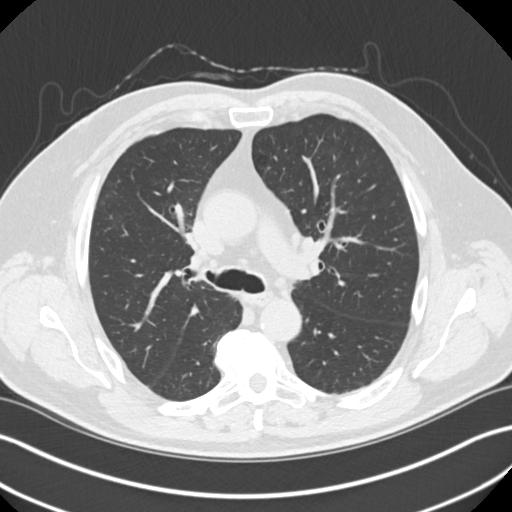
[im 110/159  mediastinal]
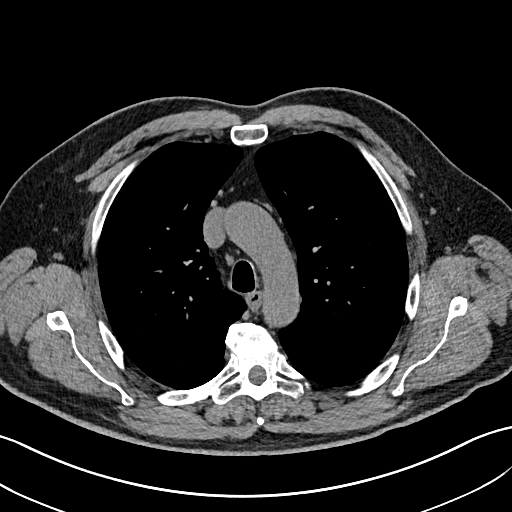
[im 110/159  lung]
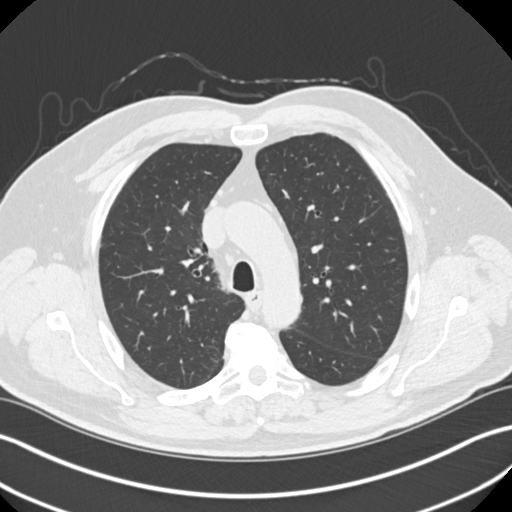
[im 122/159  lung]
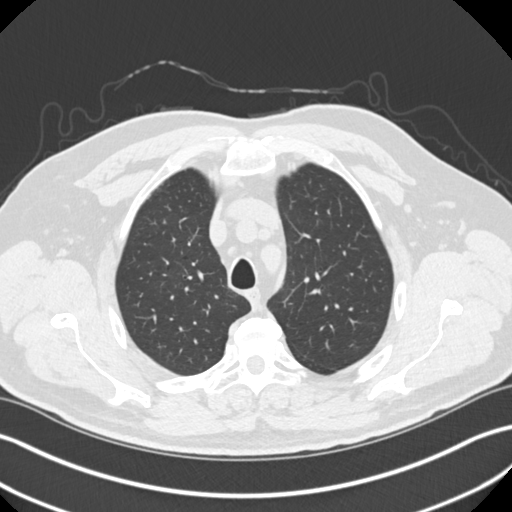
[im 134/159  lung]
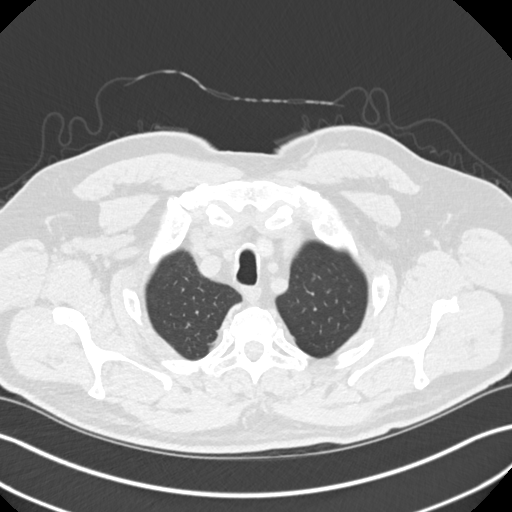
[im 146/159  lung]
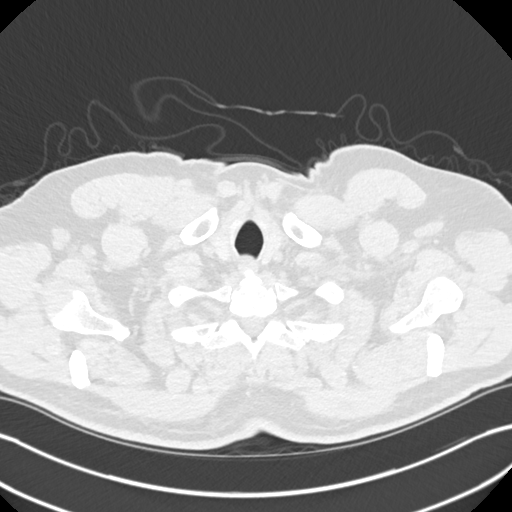

[Series 5: coronals chest 2.00 cor · coronal · 0.62mm/px · 3 of 171 slices shown]
[im 35/171  lung]
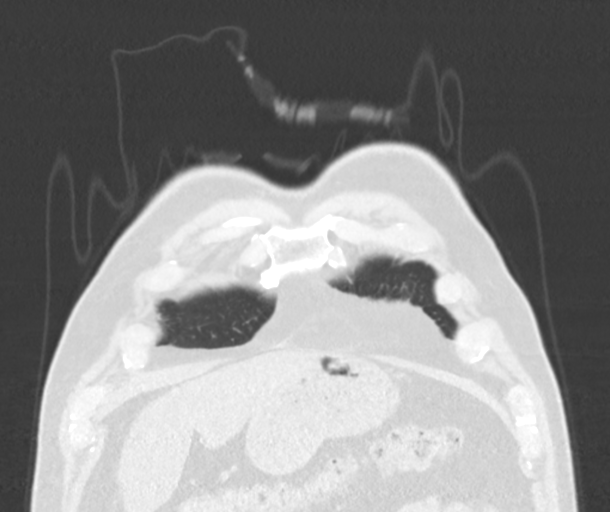
[im 69/171  lung]
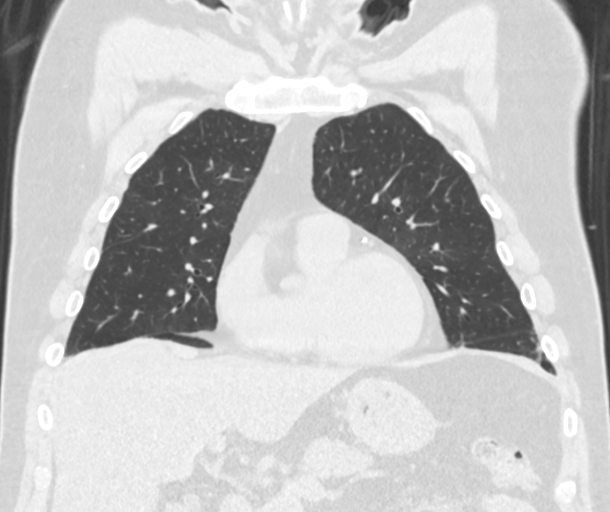
[im 103/171  lung]
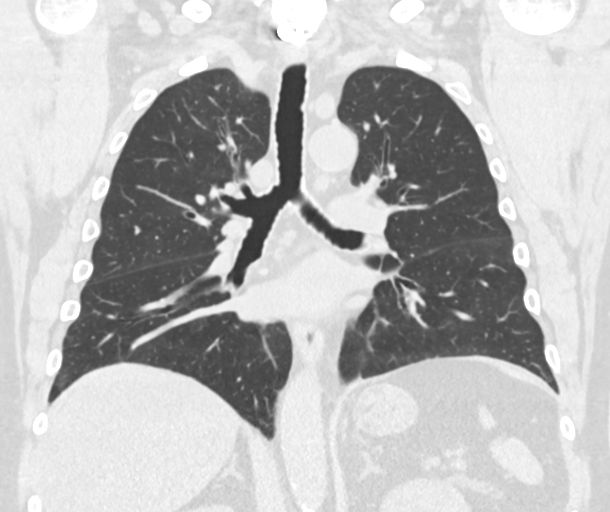

[15 of 36 positions shown; findings below may reference images not displayed]

FINDINGS: Cardiovascular: Atherosclerosis of thoracic aorta is noted without
aneurysm formation. Normal cardiac size. No pericardial effusion.
Moderate coronary artery calcifications are noted.

Mediastinum/Nodes: No enlarged mediastinal or axillary lymph nodes.
Thyroid gland, trachea, and esophagus demonstrate no significant
findings.

Lungs/Pleura: No pneumothorax or pleural effusion is noted. 5 mm
nodular density is seen in right upper lobe adjacent to right minor
fissure best seen on image number 66 of series 5. This may represent
intrapulmonary lymph node, but nodule cannot be excluded. Minimal
scarring or subsegmental atelectasis is noted in the inferior
portions of the right middle lobe and lingular segment of left upper
lobe.

Upper Abdomen: No acute abnormality.

Musculoskeletal: No chest wall mass or suspicious bone lesions
identified.
IMPRESSION: 5 mm nodular density seen in right upper lobe adjacent to right
minor fissure. No follow-up needed if patient is low-risk.
Non-contrast chest CT can be considered in 12 months if patient is
high-risk. This recommendation follows the consensus statement:
Guidelines for Management of Incidental Pulmonary Nodules Detected
[DATE].

Moderate coronary artery calcifications are noted.

Aortic Atherosclerosis ([id number].L).

## 2023-06-03 NOTE — Discharge Instructions (Signed)

## 2023-06-05 ENCOUNTER — Ambulatory Visit: Payer: Medicare Other | Admitting: Anesthesiology

## 2023-06-05 ENCOUNTER — Other Ambulatory Visit: Payer: Self-pay

## 2023-06-05 ENCOUNTER — Ambulatory Visit
Admission: RE | Admit: 2023-06-05 | Discharge: 2023-06-05 | Disposition: A | Discharge status: Home / Self Care | Payer: Medicare Other | Attending: Ophthalmology | Admitting: Ophthalmology

## 2023-06-05 ENCOUNTER — Encounter: Payer: Self-pay | Admitting: Ophthalmology

## 2023-06-05 ENCOUNTER — Encounter
Admission: RE | Disposition: A | Discharge status: Home / Self Care | Payer: Self-pay | Source: Home / Self Care | Attending: Ophthalmology

## 2023-06-05 DIAGNOSIS — I10 Essential (primary) hypertension: Secondary | ICD-10-CM | POA: Diagnosis not present

## 2023-06-05 DIAGNOSIS — I252 Old myocardial infarction: Secondary | ICD-10-CM | POA: Diagnosis not present

## 2023-06-05 DIAGNOSIS — K219 Gastro-esophageal reflux disease without esophagitis: Secondary | ICD-10-CM | POA: Diagnosis not present

## 2023-06-05 DIAGNOSIS — I2510 Atherosclerotic heart disease of native coronary artery without angina pectoris: Secondary | ICD-10-CM | POA: Insufficient documentation

## 2023-06-05 DIAGNOSIS — H2512 Age-related nuclear cataract, left eye: Secondary | ICD-10-CM | POA: Insufficient documentation

## 2023-06-05 DIAGNOSIS — Z87891 Personal history of nicotine dependence: Secondary | ICD-10-CM | POA: Diagnosis not present

## 2023-06-05 DIAGNOSIS — K449 Diaphragmatic hernia without obstruction or gangrene: Secondary | ICD-10-CM | POA: Insufficient documentation

## 2023-06-05 HISTORY — PX: CATARACT EXTRACTION W/PHACO: SHX586

## 2023-06-05 SURGERY — PHACOEMULSIFICATION, CATARACT, WITH IOL INSERTION
Anesthesia: Monitor Anesthesia Care | Site: Eye | Laterality: Left

## 2023-06-05 MED ORDER — SIGHTPATH DOSE#1 NA HYALUR & NA CHOND-NA HYALUR IO KIT
PACK | INTRAOCULAR | Status: DC | PRN
  Administered 2023-06-05: 1 via OPHTHALMIC

## 2023-06-05 MED ORDER — SIGHTPATH DOSE#1 BSS IO SOLN
INTRAOCULAR | Status: DC | PRN
  Administered 2023-06-05: 15 mL via INTRAOCULAR

## 2023-06-05 MED ORDER — BRIMONIDINE TARTRATE-TIMOLOL 0.2-0.5 % OP SOLN
OPHTHALMIC | Status: DC | PRN
  Administered 2023-06-05: 1 [drp] via OPHTHALMIC

## 2023-06-05 MED ORDER — ARMC OPHTHALMIC DILATING DROPS
1.0000 | OPHTHALMIC | Status: DC | PRN
  Administered 2023-06-05 (×3): 1 via OPHTHALMIC

## 2023-06-05 MED ORDER — SIGHTPATH DOSE#1 BSS IO SOLN
INTRAOCULAR | Status: DC | PRN
  Administered 2023-06-05: 70 mL via OPHTHALMIC

## 2023-06-05 MED ORDER — LACTATED RINGERS IV SOLN: INTRAVENOUS | Status: DC

## 2023-06-05 MED ORDER — MIDAZOLAM HCL 2 MG/2ML IJ SOLN
INTRAMUSCULAR | Status: DC | PRN
  Administered 2023-06-05: 2 mg via INTRAVENOUS

## 2023-06-05 MED ORDER — TETRACAINE HCL 0.5 % OP SOLN
1.0000 [drp] | OPHTHALMIC | Status: DC | PRN
  Administered 2023-06-05 (×3): 1 [drp] via OPHTHALMIC

## 2023-06-05 MED ORDER — CEFUROXIME OPHTHALMIC INJECTION 1 MG/0.1 ML
INJECTION | OPHTHALMIC | Status: DC | PRN
  Administered 2023-06-05: .1 mL via INTRACAMERAL

## 2023-06-05 MED ORDER — FENTANYL CITRATE (PF) 100 MCG/2ML IJ SOLN
INTRAMUSCULAR | Status: DC | PRN
  Administered 2023-06-05: 2 ug via INTRAVENOUS

## 2023-06-05 MED ORDER — LIDOCAINE HCL (PF) 2 % IJ SOLN
INTRAOCULAR | Status: DC | PRN
  Administered 2023-06-05: 4 mL via INTRAOCULAR

## 2023-06-05 SURGICAL SUPPLY — 12 items
CANNULA ANT/CHMB 27G (MISCELLANEOUS) IMPLANT
CANNULA ANT/CHMB 27GA (MISCELLANEOUS)
CATARACT SUITE SIGHTPATH (MISCELLANEOUS) ×1
FEE CATARACT SUITE SIGHTPATH (MISCELLANEOUS) ×2 IMPLANT
GLOVE SRG 8 PF TXTR STRL LF DI (GLOVE) ×2 IMPLANT
GLOVE SURG ENC TEXT LTX SZ7.5 (GLOVE) ×2 IMPLANT
GLOVE SURG UNDER POLY LF SZ8 (GLOVE) ×1
LENS IOL CLRN TRC 5 15.5 IMPLANT
LENS IOL TORIC 15.5 ×1 IMPLANT
NDL FILTER BLUNT 18X1 1/2 (NEEDLE) ×2 IMPLANT
NEEDLE FILTER BLUNT 18X1 1/2 (NEEDLE) ×1
SYR 3ML LL SCALE MARK (SYRINGE) ×2 IMPLANT

## 2023-06-05 NOTE — Transfer of Care (Signed)
Immediate Anesthesia Transfer of Care Note  Patient: Patrick Barrera  Procedure(s) Performed: CATARACT EXTRACTION PHACO AND INTRAOCULAR LENS PLACEMENT (IOC) LEFT CLAREON TORIC 8.54 00:48.8 (Left: Eye)  Patient Location: PACU  Anesthesia Type: MAC  Level of Consciousness: awake, alert  and patient cooperative  Airway and Oxygen Therapy: Patient Spontanous Breathing and Patient connected to supplemental oxygen  Post-op Assessment: Post-op Vital signs reviewed, Patient's Cardiovascular Status Stable, Respiratory Function Stable, Patent Airway and No signs of Nausea or vomiting  Post-op Vital Signs: Reviewed and stable  Complications: No notable events documented.

## 2023-06-05 NOTE — H&P (Signed)
[facility]   Primary Care Physician:  Malva Limes, MD Ophthalmologist: Dr. Lockie Mola  Pre-Procedure History & Physical: HPI:  Patrick Barrera is a 67 y.o. male here for ophthalmic surgery.   Past Medical History:  Diagnosis Date   Cholesterol blood lowered    Complex tear of lateral meniscus of right knee as current injury 03/20/2019   Complex tear of medial meniscus of right knee as current injury 03/20/2019   Coronary artery disease    Dental bridge present    permanent.  Bottom front   Elevated lipids    GERD (gastroesophageal reflux disease)    History of Barrett's esophagus    History of rectal bleeding    Hypertension    Myocardial infarction (HCC) 08/2006   With occlusion of RCA, stents x2   PONV (postoperative nausea and vomiting)     Past Surgical History:  Procedure Laterality Date   ANTERIOR CERVICAL DECOMP/DISCECTOMY FUSION N/A 09/06/2020   Procedure: Anterior Cervical Decompression Discectomy Fusion Cervical four-five;  Surgeon: Julio Sicks, MD;  Location: [facility] OR;  Service: Neurosurgery;  Laterality: N/A;   APPENDECTOMY     CARDIAC CATHETERIZATION     CARPAL TUNNEL RELEASE Left 09/22/2018   Procedure: CARPAL TUNNEL RELEASE;  Surgeon: Donato Heinz, MD;  Location: [facility] ORS;  Service: Orthopedics;  Laterality: Left;   CATARACT EXTRACTION W/PHACO Right 01/02/2023   Procedure: CATARACT EXTRACTION PHACO AND INTRAOCULAR LENS PLACEMENT (IOC) RIGHT;  Surgeon: Lockie Mola, MD;  Location: [facility];  Service: Ophthalmology;  Laterality: Right;  9.73 1:06.4   CHONDROPLASTY Right 05/11/2019   Procedure: CHONDROPLASTY;  Surgeon: Donato Heinz, MD;  Location: [facility] ORS;  Service: Orthopedics;  Laterality: Right;   COLON SURGERY     hyperplastic colon polyp removal   COLONOSCOPY WITH PROPOFOL N/A 11/08/2017   Procedure: COLONOSCOPY WITH PROPOFOL;  Surgeon: Scot Jun, MD;  Location: [facility] ENDOSCOPY;  Service: Endoscopy;   Laterality: N/A;   CORONARY STENT PLACEMENT  2007   x2   ESOPHAGOGASTRODUODENOSCOPY N/A 11/20/2021   Procedure: ESOPHAGOGASTRODUODENOSCOPY (EGD);  Surgeon: [name], DO;  Location: [facility] ENDOSCOPY;  Service: Gastroenterology;  Laterality: N/A;   ESOPHAGOGASTRODUODENOSCOPY (EGD) WITH PROPOFOL N/A 11/08/2017   Procedure: ESOPHAGOGASTRODUODENOSCOPY (EGD) WITH PROPOFOL;  Surgeon: Scot Jun, MD;  Location: [facility] ENDOSCOPY;  Service: Endoscopy;  Laterality: N/A;   GANGLION CYST EXCISION Right    KNEE ARTHROSCOPY Right 05/11/2019   Procedure: ARTHROSCOPY KNEE- partial medial/lateral menisectomy ;  Surgeon: Donato Heinz, MD;  Location: [facility] ORS;  Service: Orthopedics;  Laterality: Right;   TONSILLECTOMY AND ADENOIDECTOMY     UPPER GI ENDOSCOPY  04/23/2014   barretts esophagus, repeat 04/2017    Prior to Admission medications   Medication Sig Start Date End Date Taking? Authorizing Provider  acetaminophen (TYLENOL) 500 MG tablet Take 1,000 mg by mouth every 6 (six) hours as needed for moderate pain or headache.    Yes [provider]  aspirin EC 81 MG tablet Take 1 tablet (81 mg total) by mouth daily. Swallow whole. 08/23/20  Yes Antonieta Iba, MD  atorvastatin (LIPITOR) 80 MG tablet Take 1 tablet (80 mg total) by mouth daily. 11/05/22  Yes Antonieta Iba, MD  clopidogrel (PLAVIX) 75 MG tablet Take 1 tablet (75 mg total) by mouth daily. 11/05/22  Yes Antonieta Iba, MD  diphenhydrAMINE (BENADRYL) 50 MG tablet Take 50 mg by mouth at bedtime.   Yes [provider]  esomeprazole (NEXIUM) 20 MG  capsule Take 20 mg by mouth daily at 12 noon.   Yes [provider]  ezetimibe (ZETIA) 10 MG tablet Take 1 tablet (10 mg total) by mouth daily. 11/05/22  Yes Antonieta Iba, MD  lisinopril (ZESTRIL) 10 MG tablet TAKE 1 TABLET EVERY DAY 11/05/22  Yes Gollan, Tollie Pizza, MD  metoprolol succinate (TOPROL-XL) 25 MG 24 hr tablet Take 1 tablet (25 mg total) by mouth  daily. 11/05/22  Yes Antonieta Iba, MD  isosorbide mononitrate (IMDUR) 30 MG 24 hr tablet Take 1 tablet (30 mg total) by mouth daily. 11/05/22   Antonieta Iba, MD  nitroGLYCERIN (NITROSTAT) 0.4 MG SL tablet DISSOLVE 1 TABLET UNDER TONGUE EVERY 5 MIN AS NEED FOR CHEST PAIN. CALL 911 IF NO RELIEF AFTER 3DOSE 01/10/21   Antonieta Iba, MD    Allergies as of 05/15/2023 - Review Complete 03/15/2023  Allergen Reaction Noted   Codeine Nausea Only 08/13/2011   Simvastatin  08/23/2021    Family History  Problem Relation Age of Onset   Heart disease Father        Stents placed   Pulmonary fibrosis Father     Social History   Socioeconomic History   Marital status: Married    Spouse name: Not on file   Number of children: 2   Years of education: Not on file   Highest education level: Not on file  Occupational History   Occupation: MACHINE OPERATOR    Employer: [employer]    Comment: Full time  Tobacco Use   Smoking status: Former    Current packs/day: 0.00    Average packs/day: 1 pack/day for 45.0 years (45.0 ttl pk-yrs)    Types: Cigarettes, E-cigarettes    Start date: 07/13/1967    Quit date: 07/12/2012    Years since quitting: 10.9   Smokeless tobacco: Never   Tobacco comments:    changed from cigarettes to Vaping in 2013. Previousl 1.5 ppd since 12  Vaping Use   Vaping status: Every Day   Start date: 10/09/2011   Substances: Nicotine  Substance and Sexual Activity   Alcohol use: Yes    Alcohol/week: 1.0 standard drink of alcohol    Types: 1 Standard drinks or equivalent per week    Comment: moderate   Drug use: No   Sexual activity: Not on file  Other Topics Concern   Not on file  Social History Narrative   No regular exercise.   Social Determinants of Health   Financial Resource Strain: Not on file  Food Insecurity: Not on file  Transportation Needs: No Transportation Needs (12/13/2021)   Received from [facility]   PRAPARE -  Transportation  Physical Activity: Not on file  Stress: Not on file  Social Connections: Not on file  Intimate Partner Violence: Not on file    Review of Systems: See HPI, otherwise negative ROS  Physical Exam: BP (!) 147/95   Temp 98.6 F (37 C) (Temporal)   Resp 13   Ht 5\' 9"  (1.753 m)   Wt 110.7 kg   SpO2 97%   BMI 36.05 kg/m  General:   Alert,  pleasant and cooperative in NAD Head:  Normocephalic and atraumatic. Lungs:  Clear to auscultation.    Heart:  Regular rate and rhythm.   Impression/Plan: Patrick Barrera is here for ophthalmic surgery.  Risks, benefits, limitations, and alternatives regarding ophthalmic surgery have been reviewed with the patient.  Questions have been answered.  All  parties agreeable.   Lockie Mola, MD  06/05/2023, 8:54 AM

## 2023-06-05 NOTE — Anesthesia Postprocedure Evaluation (Signed)
Anesthesia Post Note  Patient: Patrick Barrera  Procedure(s) Performed: CATARACT EXTRACTION PHACO AND INTRAOCULAR LENS PLACEMENT (IOC) LEFT CLAREON TORIC 8.54 00:48.8 (Left: Eye)  Patient location during evaluation: PACU Anesthesia Type: MAC Level of consciousness: awake and alert Pain management: pain level controlled Vital Signs Assessment: post-procedure vital signs reviewed and stable Respiratory status: spontaneous breathing, nonlabored ventilation, respiratory function stable and patient connected to nasal cannula oxygen Cardiovascular status: stable and blood pressure returned to baseline Postop Assessment: no apparent nausea or vomiting Anesthetic complications: no   No notable events documented.   Last Vitals:  Vitals:   06/05/23 0944 06/05/23 0949  BP: (!) 126/91 129/88  Pulse: 75 74  Resp: 15 13  Temp: (!) 36.1 C (!) 36.1 C  SpO2: 95% 94%    Last Pain:  Vitals:   06/05/23 0949  TempSrc:   PainSc: 0-No pain                 Marisue Humble

## 2023-06-05 NOTE — Op Note (Signed)
LOCATION:  [facility]   PREOPERATIVE DIAGNOSIS:  Nuclear sclerotic cataract of the left eye.  H25.12  POSTOPERATIVE DIAGNOSIS:  Nuclear sclerotic cataract of the left eye.   PROCEDURE:  Phacoemulsification with Toric posterior chamber intraocular lens placement of the left eye.  Ultrasound time: Procedure(s): CATARACT EXTRACTION PHACO AND INTRAOCULAR LENS PLACEMENT (IOC) LEFT CLAREON TORIC 8.54 00:48.8 (Left)  LENS:   Implant Name Type Inv. Item Serial No. Manufacturer Lot No. LRB No. Used Action  LENS IOL TORIC 15.5 - [id number]  LENS IOL TORIC 15.5 [id number] [employer]  Left 1 Implanted     [id number] 15.5D Toric intraocular lens with 3.0 diopters of cylindrical power with axis orientation at 72 degrees.     SURGEON:  Deirdre Evener, MD   ANESTHESIA:  Topical with tetracaine drops and 2% Xylocaine jelly, augmented with 1% preservative-free intracameral lidocaine.  COMPLICATIONS:  None.   DESCRIPTION OF PROCEDURE:  The patient was identified in the holding room and transported to the operating suite and placed in the supine position under the operating microscope.  The left eye was identified as the operative eye, and it was prepped and draped in the usual sterile ophthalmic fashion.    A clear-corneal paracentesis incision was made at the 1:30 position.  0.5 ml of preservative-free 1% lidocaine was injected into the anterior chamber. The anterior chamber was filled with Viscoat.  A 2.4 millimeter near clear corneal incision was then made at the 10:30 position.  A cystotome and capsulorrhexis forceps were then used to make a curvilinear capsulorrhexis.  Hydrodissection and hydrodelineation were then performed using balanced salt solution.   Phacoemulsification was then used in stop and chop fashion to remove the lens, nucleus and epinucleus.  The remaining cortex was aspirated using the irrigation and aspiration handpiece.  Provisc viscoelastic was then placed into the  capsular bag to distend it for lens placement.  The Verion digital marker was used to align the implant at the intended axis.   A Toric lens was then injected into the capsular bag.  It was rotated clockwise until the axis marks on the lens were approximately 15 degrees in the counterclockwise direction to the intended alignment.  The viscoelastic was aspirated from the eye using the irrigation aspiration handpiece.  Then, a Koch spatula through the sideport incision was used to rotate the lens in a clockwise direction until the axis markings of the intraocular lens were lined up with the Verion alignment.  Balanced salt solution was then used to hydrate the wounds. Cefuroxime 0.1 ml of a 10mg /ml solution was injected into the anterior chamber for a dose of 1 mg of intracameral antibiotic at the completion of the case.    The eye was noted to have a physiologic pressure and there was no wound leak noted.   Timolol and Brimonidine drops were applied to the eye.  The patient was taken to the recovery room in stable condition having had no complications of anesthesia or surgery.  Patrick Barrera 06/05/2023, 9:42 AM

## 2023-06-06 ENCOUNTER — Telehealth: Payer: Self-pay

## 2023-06-06 ENCOUNTER — Telehealth: Payer: Self-pay | Admitting: Cardiovascular Disease

## 2023-06-06 ENCOUNTER — Encounter: Payer: Self-pay | Admitting: Ophthalmology

## 2023-06-06 NOTE — Telephone Encounter (Signed)
Pt is scheduled for tele preop on 9/4 at 3:20 pm. Med rec and consent done    Patient Consent for Virtual Visit        [name] has provided verbal consent on 06/06/2023 for a virtual visit (video or telephone).   CONSENT FOR VIRTUAL VISIT FOR:  Patrick Barrera  By participating in this virtual visit I agree to the following:  I hereby voluntarily request, consent and authorize [facility] and its employed or contracted physicians, physician assistants, nurse practitioners or other licensed health care professionals (the Practitioner), to provide me with telemedicine health care services (the "Services") as deemed necessary by the treating Practitioner. I acknowledge and consent to receive the Services by the Practitioner via telemedicine. I understand that the telemedicine visit will involve communicating with the Practitioner through live audiovisual communication technology and the disclosure of certain medical information by electronic transmission. I acknowledge that I have been given the opportunity to request an in-person assessment or other available alternative prior to the telemedicine visit and am voluntarily participating in the telemedicine visit.  I understand that I have the right to withhold or withdraw my consent to the use of telemedicine in the course of my care at any time, without affecting my right to future care or treatment, and that the Practitioner or I may terminate the telemedicine visit at any time. I understand that I have the right to inspect all information obtained and/or recorded in the course of the telemedicine visit and may receive copies of available information for a reasonable fee.  I understand that some of the potential risks of receiving the Services via telemedicine include:  Delay or interruption in medical evaluation due to technological equipment failure or disruption; Information transmitted may not be sufficient (e.g. poor resolution of  images) to allow for appropriate medical decision making by the Practitioner; and/or  In rare instances, security protocols could fail, causing a breach of personal health information.  Furthermore, I acknowledge that it is my responsibility to provide information about my medical history, conditions and care that is complete and accurate to the best of my ability. I acknowledge that Practitioner's advice, recommendations, and/or decision may be based on factors not within their control, such as incomplete or inaccurate data provided by me or distortions of diagnostic images or specimens that may result from electronic transmissions. I understand that the practice of medicine is not an exact science and that Practitioner makes no warranties or guarantees regarding treatment outcomes. I acknowledge that a copy of this consent can be made available to me via my patient portal [name] MyChart), or I can request a printed copy by calling the office of [facility].    I understand that my insurance will be billed for this visit.   I have read or had this consent read to me. I understand the contents of this consent, which adequately explains the benefits and risks of the Services being provided via telemedicine.  I have been provided ample opportunity to ask questions regarding this consent and the Services and have had my questions answered to my satisfaction. I give my informed consent for the services to be provided through the use of telemedicine in my medical care

## 2023-06-06 NOTE — Telephone Encounter (Signed)
Pt is scheduled for tele preop on 9/4 at 3:20 pm. Med rec and consent done

## 2023-06-06 NOTE — Telephone Encounter (Signed)
   Pre-operative Risk Assessment    Patient Name: Patrick Barrera  DOB: [birth year] MRN: [id number]{      Request for Surgical Clearance    Procedure:   RIGHT TOTAL KNEE ARTHROPLASTY WITH ROBOTIC ASSISTANCE  Date of Surgery:  Clearance 06/18/23                             {  Surgeon:  DR Brynda Peon Surgeon's Group or Practice Name:  [facility] Phone number:  [phone] Fax number:  [phone]  Type of Clearance Requested:   - Pharmacy:  Hold REQUEST ANTICOAG HOLD, BUT  NOT INDICATED  Type of Anesthesia:  Not Indicated   Additional requests/questions:    Jorja Loa   06/06/2023, 3:23 PM

## 2023-06-06 NOTE — Telephone Encounter (Signed)
   Name: Patrick Barrera  DOB: 10-Jul-1956  MRN: [id number]  Primary Cardiologist: Julien Nordmann, MD   Preoperative team, please contact this patient and set up a phone call appointment for further preoperative risk assessment. Please obtain consent and complete medication review. Thank you for your help.  I confirm that guidance regarding antiplatelet and oral anticoagulation therapy has been completed and, if necessary, noted below.  Per office protocol, if patient is without any new symptoms or concerns at the time of their virtual visit, he may hold Plavix for 5 days prior to procedure. Please resume Plavix as soon as possible postprocedure, at the discretion of the surgeon.   Regarding ASA therapy, we recommend continuation of ASA throughout the perioperative period.  However, if the surgeon feels that cessation of ASA is required in the perioperative period, it may be stopped 5-7 days prior to surgery with a plan to resume it as soon as felt to be feasible from a surgical standpoint in the post-operative period.   Joylene Grapes, NP 06/06/2023, 3:50 PM [facility]

## 2023-06-12 ENCOUNTER — Other Ambulatory Visit: Payer: Self-pay | Admitting: Cardiovascular Disease

## 2023-06-12 ENCOUNTER — Ambulatory Visit: Payer: Medicare Other | Attending: Cardiovascular Disease | Admitting: Nurse Practitioner

## 2023-06-12 DIAGNOSIS — Z01810 Encounter for preprocedural cardiovascular examination: Secondary | ICD-10-CM

## 2023-06-12 NOTE — Progress Notes (Signed)
Virtual Visit via Telephone Note   Because of Patrick Barrera's co-morbid illnesses, he is at least at moderate risk for complications without adequate follow up.  This format is felt to be most appropriate for this patient at this time.  The patient did not have access to video technology/had technical difficulties with video requiring transitioning to audio format only (telephone).  All issues noted in this document were discussed and addressed.  No physical exam could be performed with this format.  Please refer to the patient's chart for his consent to telehealth for [facility].  Evaluation Performed:  Preoperative cardiovascular risk assessment _____________   Date:  06/12/2023   Patient ID:  Patrick, Barrera September 03, 1956, MRN [id number] Patient Location:  Home Provider location:   Office  Primary Care Provider:  Malva Limes, MD Primary Cardiologist:  Julien Nordmann, MD  Chief Complaint / Patient Profile   67 y.o. y/o male with a h/o CAD s/p BMS x 2-RCA, hypertension, hyperlipidemia, and tobacco use who is pending RIGHT TOTAL KNEE ARTHROPLASTY WITH ROBOTIC ASSISTANCE on 06/18/2023 with Dr. Brynda Peon of [facility] and presents today for telephonic preoperative cardiovascular risk assessment.  History of Present Illness    Patrick Barrera is a 67 y.o. male who presents via audio/video conferencing for a telehealth visit today.  Pt was last seen in cardiology clinic on 11/05/2022 by Dr. Mariah Milling. At that time [name] was doing well.  The patient is now pending procedure as outlined above. Since his last visit, he has done well from a cardiac standpoint.   He denies chest pain, palpitations, dyspnea, pnd, orthopnea, n, v, dizziness, syncope, edema, weight gain, or early satiety. All other systems reviewed and are otherwise negative except as noted above.   Past Medical History    Past Medical History:  Diagnosis Date   Cholesterol blood lowered     Complex tear of lateral meniscus of right knee as current injury 03/20/2019   Complex tear of medial meniscus of right knee as current injury 03/20/2019   Coronary artery disease    Dental bridge present    permanent.  Bottom front   Elevated lipids    GERD (gastroesophageal reflux disease)    History of Barrett's esophagus    History of rectal bleeding    Hypertension    Myocardial infarction (HCC) 08/2006   With occlusion of RCA, stents x2   PONV (postoperative nausea and vomiting)    Past Surgical History:  Procedure Laterality Date   ANTERIOR CERVICAL DECOMP/DISCECTOMY FUSION N/A 09/06/2020   Procedure: Anterior Cervical Decompression Discectomy Fusion Cervical four-five;  Surgeon: Julio Sicks, MD;  Location: [facility] OR;  Service: Neurosurgery;  Laterality: N/A;   APPENDECTOMY     CARDIAC CATHETERIZATION     CARPAL TUNNEL RELEASE Left 09/22/2018   Procedure: CARPAL TUNNEL RELEASE;  Surgeon: Donato Heinz, MD;  Location: [facility] ORS;  Service: Orthopedics;  Laterality: Left;   CATARACT EXTRACTION W/PHACO Right 01/02/2023   Procedure: CATARACT EXTRACTION PHACO AND INTRAOCULAR LENS PLACEMENT (IOC) RIGHT;  Surgeon: Lockie Mola, MD;  Location: [facility];  Service: Ophthalmology;  Laterality: Right;  9.73 1:06.4   CATARACT EXTRACTION W/PHACO Left 06/05/2023   Procedure: CATARACT EXTRACTION PHACO AND INTRAOCULAR LENS PLACEMENT (IOC) LEFT CLAREON TORIC 8.54 00:48.8;  Surgeon: Lockie Mola, MD;  Location: [facility];  Service: Ophthalmology;  Laterality: Left;   CHONDROPLASTY Right 05/11/2019   Procedure: CHONDROPLASTY;  Surgeon: Donato Heinz,  MD;  Location: [facility] ORS;  Service: Orthopedics;  Laterality: Right;   COLON SURGERY     hyperplastic colon polyp removal   COLONOSCOPY WITH PROPOFOL N/A 11/08/2017   Procedure: COLONOSCOPY WITH PROPOFOL;  Surgeon: Scot Jun, MD;  Location: [facility] ENDOSCOPY;  Service: Endoscopy;  Laterality: N/A;   CORONARY STENT  PLACEMENT  2007   x2   ESOPHAGOGASTRODUODENOSCOPY N/A 11/20/2021   Procedure: ESOPHAGOGASTRODUODENOSCOPY (EGD);  Surgeon: [name], DO;  Location: [facility] ENDOSCOPY;  Service: Gastroenterology;  Laterality: N/A;   ESOPHAGOGASTRODUODENOSCOPY (EGD) WITH PROPOFOL N/A 11/08/2017   Procedure: ESOPHAGOGASTRODUODENOSCOPY (EGD) WITH PROPOFOL;  Surgeon: Scot Jun, MD;  Location: [facility] ENDOSCOPY;  Service: Endoscopy;  Laterality: N/A;   GANGLION CYST EXCISION Right    KNEE ARTHROSCOPY Right 05/11/2019   Procedure: ARTHROSCOPY KNEE- partial medial/lateral menisectomy ;  Surgeon: Donato Heinz, MD;  Location: [facility] ORS;  Service: Orthopedics;  Laterality: Right;   TONSILLECTOMY AND ADENOIDECTOMY     UPPER GI ENDOSCOPY  04/23/2014   barretts esophagus, repeat 04/2017    Allergies  Allergies  Allergen Reactions   Codeine Nausea Only   Simvastatin     Muscle ache     Home Medications    Prior to Admission medications   Medication Sig Start Date End Date Taking? Authorizing Provider  acetaminophen (TYLENOL) 500 MG tablet Take 1,000 mg by mouth every 6 (six) hours as needed for moderate pain or headache.     [provider]  aspirin EC 81 MG tablet Take 1 tablet (81 mg total) by mouth daily. Swallow whole. 08/23/20   Antonieta Iba, MD  atorvastatin (LIPITOR) 80 MG tablet Take 1 tablet (80 mg total) by mouth daily. 11/05/22   Antonieta Iba, MD  clopidogrel (PLAVIX) 75 MG tablet Take 1 tablet (75 mg total) by mouth daily. 11/05/22   Antonieta Iba, MD  diphenhydrAMINE (BENADRYL) 50 MG tablet Take 50 mg by mouth at bedtime.    [provider]  esomeprazole (NEXIUM) 20 MG capsule Take 20 mg by mouth daily at 12 noon.    [provider]  ezetimibe (ZETIA) 10 MG tablet Take 1 tablet (10 mg total) by mouth daily. 11/05/22   Antonieta Iba, MD  isosorbide mononitrate (IMDUR) 30 MG 24 hr tablet Take 1 tablet (30 mg total) by mouth daily. 11/05/22    Antonieta Iba, MD  lisinopril (ZESTRIL) 10 MG tablet TAKE 1 TABLET EVERY DAY 11/05/22   Antonieta Iba, MD  metoprolol succinate (TOPROL-XL) 25 MG 24 hr tablet TAKE 1 TABLET EVERY DAY 06/12/23   Gollan, Tollie Pizza, MD  nitroGLYCERIN (NITROSTAT) 0.4 MG SL tablet DISSOLVE 1 TABLET UNDER TONGUE EVERY 5 MIN AS NEED FOR CHEST PAIN. CALL 911 IF NO RELIEF AFTER 3DOSE 01/10/21   Antonieta Iba, MD    Physical Exam    Vital Signs:  RAHMON KLEMA does not have vital signs available for review today.  Given telephonic nature of communication, physical exam is limited. AAOx3. NAD. Normal affect.  Speech and respirations are unlabored.  Accessory Clinical Findings    None  Assessment & Plan    1.  Preoperative Cardiovascular Risk Assessment:  According to the Revised Cardiac Risk Index (RCRI), his Perioperative Risk of Major Cardiac Event is (%): 0.9. His Functional Capacity in METs is: 7.01 according to the Duke Activity Status Index (DASI). Therefore, based on ACC/AHA guidelines, patient would be at acceptable risk for the planned procedure without further cardiovascular testing.  The patient was advised that if he develops new symptoms prior to surgery to contact our office to arrange for a follow-up visit, and he verbalized understanding.  Per office protocol, he may hold Plavix for 5 days prior to procedure. Please resume Plavix as soon as possible postprocedure, at the discretion of the surgeon.    Regarding ASA therapy, we recommend continuation of ASA throughout the perioperative period.  However, if the surgeon feels that cessation of ASA is required in the perioperative period, it may be stopped 5-7 days prior to surgery with a plan to resume it as soon as felt to be feasible from a surgical standpoint in the post-operative period.    A copy of this note will be routed to requesting surgeon.  Time:   Today, I have spent 5 minutes with the patient with telehealth technology discussing  medical history, symptoms, and management plan.     Joylene Grapes, NP  06/12/2023, 3:27 PM

## 2023-06-20 ENCOUNTER — Other Ambulatory Visit: Payer: Self-pay | Admitting: Cardiovascular Disease

## 2023-08-24 ENCOUNTER — Other Ambulatory Visit: Payer: Self-pay | Admitting: Cardiovascular Disease

## 2023-09-01 ENCOUNTER — Other Ambulatory Visit: Payer: Self-pay | Admitting: Cardiovascular Disease

## 2023-09-02 NOTE — Telephone Encounter (Signed)
last visit 11/05/22 with plan to f/u in 12 months.  Next visit: 11/12/23

## 2023-09-10 ENCOUNTER — Encounter: Payer: Self-pay | Admitting: Family Medicine

## 2023-09-10 ENCOUNTER — Other Ambulatory Visit: Payer: Self-pay | Admitting: Family Medicine

## 2023-09-10 DIAGNOSIS — I714 Abdominal aortic aneurysm, without rupture, unspecified: Secondary | ICD-10-CM

## 2023-09-17 ENCOUNTER — Ambulatory Visit
Admission: RE | Admit: 2023-09-17 | Discharge: 2023-09-17 | Disposition: A | Discharge status: Home / Self Care | Payer: Medicare Other | Source: Ambulatory Visit | Attending: Family Medicine | Admitting: Family Medicine

## 2023-09-17 DIAGNOSIS — I714 Abdominal aortic aneurysm, without rupture, unspecified: Secondary | ICD-10-CM | POA: Insufficient documentation

## 2023-10-25 ENCOUNTER — Telehealth: Payer: Self-pay | Admitting: Cardiovascular Disease

## 2023-10-25 NOTE — Telephone Encounter (Signed)
   Pre-operative Risk Assessment    Patient Name: Patrick Barrera  DOB: 02/22/1956 MRN: [id number]   Date of last office visit: 11/05/22 Date of next office visit: 11/12/23   Request for Surgical Clearance    Procedure:  Colonoscopy  Date of Surgery:  Clearance 01/06/24                                Surgeon:  Dr. Bing Plume Surgeon's Group or Practice Name:  [facility] Phone number:  [phone] Fax number:  [phone]   Type of Clearance Requested:  Pharmacy, hold Plavix 5days    Type of Anesthesia:  Not Indicated   Additional requests/questions:    Signed, Narda Amber   10/25/2023, 3:27 PM

## 2023-10-25 NOTE — Telephone Encounter (Signed)
   Name: Patrick Barrera  DOB: 01-02-1956  MRN: [id number]  Primary Cardiologist: Julien Nordmann, MD  Chart reviewed as part of pre-operative protocol coverage. The patient has an upcoming visit scheduled with Dr. Mariah Milling on 11/12/2022 at which time clearance can be addressed in case there are any issues that would impact surgical recommendations.   I added preop FYI to appointment note so that provider is aware to address at time of outpatient visit.  Per office protocol the cardiology provider should forward their finalized clearance decision and recommendations regarding antiplatelet therapy to the requesting party below.     I will route this message as FYI to requesting party and remove this message from the preop box as separate preop APP input not needed at this time.   Please call with any questions.  Napoleon Form, Leodis Rains, NP  10/25/2023, 3:33 PM

## 2023-11-05 ENCOUNTER — Ambulatory Visit: Payer: Medicare Other | Admitting: Family Medicine

## 2023-11-05 ENCOUNTER — Encounter: Payer: Self-pay | Admitting: Family Medicine

## 2023-11-05 ENCOUNTER — Telehealth: Payer: Self-pay | Admitting: Family Medicine

## 2023-11-05 VITALS — BP 136/79 | HR 65 | Resp 18 | Ht 69.0 in | Wt 248.0 lb

## 2023-11-05 DIAGNOSIS — Z0000 Encounter for general adult medical examination without abnormal findings: Secondary | ICD-10-CM | POA: Diagnosis not present

## 2023-11-05 DIAGNOSIS — E782 Mixed hyperlipidemia: Secondary | ICD-10-CM

## 2023-11-05 DIAGNOSIS — I10 Essential (primary) hypertension: Secondary | ICD-10-CM

## 2023-11-05 DIAGNOSIS — Z125 Encounter for screening for malignant neoplasm of prostate: Secondary | ICD-10-CM

## 2023-11-05 DIAGNOSIS — I714 Abdominal aortic aneurysm, without rupture, unspecified: Secondary | ICD-10-CM

## 2023-11-05 NOTE — Telephone Encounter (Signed)
Patient states he had Shingrix at [facility] on [address] last year, but we have no record of it. Can you please contact [facility] and have them fax over immunization records

## 2023-11-05 NOTE — Progress Notes (Signed)
Annual Wellness Visit     Patient: Patrick Barrera, Male    DOB: [birth year], 68 y.o.   MRN: [id number] Visit Date: 11/05/2023  Today's Provider: Mila Merry, MD    Subjective    Patrick Barrera is a 68 y.o. male who presents today for his Annual Wellness Visit. He reports consuming a  healthy  diet.  He generally feels well. He reports sleeping fairly well.   He continues to follow up regularly with Dr. Mariah Milling, has appointment next week. Doing well current medications with no chest pain, palpitations, or dyspnea. Has AAA and recently AA was stable.    Medications: Outpatient Medications Prior to Visit  Medication Sig   acetaminophen (TYLENOL) 500 MG tablet Take 1,000 mg by mouth every 6 (six) hours as needed for moderate pain or headache.    aspirin EC 81 MG tablet Take 1 tablet (81 mg total) by mouth daily. Swallow whole.   atorvastatin (LIPITOR) 80 MG tablet TAKE 1 TABLET EVERY DAY   clopidogrel (PLAVIX) 75 MG tablet TAKE 1 TABLET EVERY DAY   diphenhydrAMINE (BENADRYL) 50 MG tablet Take 50 mg by mouth at bedtime.   ezetimibe (ZETIA) 10 MG tablet TAKE 1 TABLET EVERY DAY   lisinopril (ZESTRIL) 10 MG tablet TAKE 1 TABLET EVERY DAY   metoprolol succinate (TOPROL-XL) 25 MG 24 hr tablet TAKE 1 TABLET EVERY DAY   nitroGLYCERIN (NITROSTAT) 0.4 MG SL tablet DISSOLVE 1 TABLET UNDER TONGUE EVERY 5 MIN AS NEED FOR CHEST PAIN. CALL 911 IF NO RELIEF AFTER 3DOSE   pantoprazole (PROTONIX) 40 MG tablet Take 40 mg by mouth daily.   isosorbide mononitrate (IMDUR) 30 MG 24 hr tablet Take 1 tablet (30 mg total) by mouth daily. (Patient not taking: Reported on 11/05/2023)   [DISCONTINUED] esomeprazole (NEXIUM) 20 MG capsule Take 20 mg by mouth daily at 12 noon.   No facility-administered medications prior to visit.    Allergies  Allergen Reactions   Codeine Nausea Only   Simvastatin     Muscle ache     Patient Care Team: Malva Limes, MD as PCP - General (Family Medicine) Mariah Milling,  Tollie Pizza, MD as PCP - Cardiology (Cardiology) Mariah Milling Tollie Pizza, MD as Consulting Physician (Cardiology) Julio Sicks, MD as Consulting Physician (Neurosurgery) Hallows, Walker Kehr, MD (Orthopedic Surgery) Regis Bill, MD as Consulting Physician (Gastroenterology)  Review of Systems  Constitutional:  Negative for appetite change, chills and fever.  Respiratory:  Negative for chest tightness, shortness of breath and wheezing.   Cardiovascular:  Negative for chest pain and palpitations.  Gastrointestinal:  Negative for abdominal pain, nausea and vomiting.         Objective    Vitals: BP 136/79   Pulse 65   Resp 18   Ht 5\' 9"  (1.753 m)   Wt 248 lb (112.5 kg)   SpO2 97%   BMI 36.62 kg/m    Physical Exam  General: Appearance:    Mildly obese male in no acute distress  Eyes:    PERRL, conjunctiva/corneas clear, EOM's intact       Lungs:     Clear to auscultation bilaterally, respirations unlabored  Heart:    Normal heart rate. Normal rhythm. No murmurs, rubs, or gallops.    MS:   All extremities are intact.    Neurologic:   Awake, alert, oriented x 3. No apparent focal neurological defect.         Most recent functional status assessment:  06/05/2023    8:32 AM  In your present state of health, do you have any difficulty performing the following activities:  Hearing? 0  Vision? 0  Difficulty concentrating or making decisions? 0  Walking or climbing stairs? 0  Dressing or bathing? 0   Most recent fall risk assessment:    11/05/2023    9:04 AM  Fall Risk   Falls in the past year? 0  Number falls in past yr: 0  Injury with Fall? 0    Most recent depression screenings:    11/05/2023    9:04 AM 10/31/2022    8:31 AM  PHQ 2/9 Scores  PHQ - 2 Score 0 0  PHQ- 9 Score 1 2   Most recent cognitive screening:    10/31/2022    8:32 AM  6CIT Screen  What Year? 0 points  What month? 0 points  What time? 0 points  Count back from 20 0 points  Months in reverse  0 points  Repeat phrase 0 points  Total Score 0 points   Most recent Audit-C alcohol use screening    10/31/2022    8:32 AM  Alcohol Use Disorder Test (AUDIT)  1. How often do you have a drink containing alcohol? 1  2. How many drinks containing alcohol do you have on a typical day when you are drinking? 0  3. How often do you have six or more drinks on one occasion? 1  AUDIT-C Score 2   A score of 3 or more in women, and 4 or more in men indicates increased risk for alcohol abuse, EXCEPT if all of the points are from question 1   No results found for any visits on 11/05/23.  Assessment & Plan     Annual wellness visit done today including the all of the following: Reviewed patient's Family Medical History Reviewed and updated list of patient's medical providers Assessment of cognitive impairment was done Assessed patient's functional ability Established a written schedule for health screening services Health Risk Assessent Completed and Reviewed  Exercise Activities and Dietary recommendations  Goals   None     Immunization History  Administered Date(s) Administered   Fluad Quad(high Dose 65+) 07/06/2019, 09/20/2020, 10/13/2021, 10/31/2022   Influenza,inj,Quad PF,6+ Mos 07/14/2018   Pneumococcal Conjugate-13 07/06/2019   Pneumococcal Polysaccharide-23 01/14/2017, 10/13/2021   Td 02/18/2018   Tdap 12/05/2007   Zoster Recombinant(Shingrix) 07/06/2019   Zoster, Live 12/09/2015    Health Maintenance  Topic Date Due   Zoster Vaccines- Shingrix (2 of 2) 08/31/2019   Colonoscopy  11/08/2022   INFLUENZA VACCINE  05/09/2023   COVID-19 Vaccine (1 - 2024-25 season) Never done   Lung Cancer Screening  03/17/2024   Medicare Annual Wellness (AWV)  11/04/2024   DTaP/Tdap/Td (3 - Td or Tdap) 02/19/2028   Pneumonia Vaccine 1+ Years old  Completed   Hepatitis C Screening  Completed   HPV VACCINES  Aged Out     Discussed health benefits of physical activity, and encouraged  him to engage in regular exercise appropriate for his age and condition.     2. Abdominal aortic aneurysm (AAA) without rupture, unspecified part (HCC) Stable on aortic ultrasound done in December. Next in 09/2026  3. Primary hypertension Well controlled.  Continue current medications.    4. Mixed hyperlipidemia Doing well on stating and ezetimibe.  - CBC - Comprehensive metabolic panel - Lipid panel  5. Prostate cancer screening  - PSA Total (Reflex To Free)  Mila Merry, MD  [facility] [phone] (phone) [phone] (fax)  [facility]

## 2023-11-06 ENCOUNTER — Encounter: Payer: Self-pay | Admitting: Family Medicine

## 2023-11-06 LAB — COMPREHENSIVE METABOLIC PANEL
ALT: 35 [IU]/L (ref 0–44)
AST: 23 [IU]/L (ref 0–40)
Albumin: 4 g/dL (ref 3.9–4.9)
Alkaline Phosphatase: 93 [IU]/L (ref 44–121)
BUN/Creatinine Ratio: 13 (ref 10–24)
BUN: 14 mg/dL (ref 8–27)
Bilirubin Total: 0.5 mg/dL (ref 0.0–1.2)
CO2: 24 mmol/L (ref 20–29)
Calcium: 9.6 mg/dL (ref 8.6–10.2)
Chloride: 103 mmol/L (ref 96–106)
Creatinine, Ser: 1.08 mg/dL (ref 0.76–1.27)
Globulin, Total: 3 g/dL (ref 1.5–4.5)
Glucose: 133 mg/dL — ABNORMAL HIGH (ref 70–99)
Potassium: 4.8 mmol/L (ref 3.5–5.2)
Sodium: 140 mmol/L (ref 134–144)
Total Protein: 7 g/dL (ref 6.0–8.5)
eGFR: 74 mL/min/{1.73_m2} (ref >59)

## 2023-11-06 LAB — CBC
Hematocrit: 46.3 % (ref 37.5–51.0)
Hemoglobin: 14.7 g/dL (ref 13.0–17.7)
MCH: 31.6 pg (ref 26.6–33.0)
MCHC: 31.7 g/dL (ref 31.5–35.7)
MCV: 100 fL — ABNORMAL HIGH (ref 79–97)
Platelets: 238 10*3/uL (ref 150–450)
RBC: 4.65 x10E6/uL (ref 4.14–5.80)
RDW: 13.3 % (ref 11.6–15.4)
WBC: 7.3 10*3/uL (ref 3.4–10.8)

## 2023-11-06 LAB — LIPID PANEL
Chol/HDL Ratio: 4 {ratio} (ref 0.0–5.0)
Cholesterol, Total: 117 mg/dL (ref 100–199)
HDL: 29 mg/dL — ABNORMAL LOW (ref >39)
LDL Chol Calc (NIH): 56 mg/dL (ref 0–99)
Triglycerides: 191 mg/dL — ABNORMAL HIGH (ref 0–149)
VLDL Cholesterol Cal: 32 mg/dL (ref 5–40)

## 2023-11-06 LAB — PSA TOTAL (REFLEX TO FREE): Prostate Specific Ag, Serum: 0.5 ng/mL (ref 0.0–4.0)

## 2023-11-06 NOTE — Telephone Encounter (Signed)
Request sent via fax. Please see under letter

## 2023-11-11 NOTE — Progress Notes (Signed)
 Patient ID: Patrick Barrera, male   DOB: 03-Jan-1956, 68 y.o.   MRN: [id number]   Cardiology Office Note  Date:  11/12/2023   ID:  Patrick Barrera, Patrick Barrera September 27, 1956, MRN [id number]  PCP:  Patrick Nancyann BRAVO, MD   Chief Complaint  Patient presents with   12 month follow up     Doing well. Patient is scheduled January 06, 2024 for a colonoscopy.     HPI:  Patrick Barrera is a 68 year old gentleman with  long smoking history who stopped in October 2013,  COPD HTN,  coronary artery disease,  occlusion of his RCA in November 2007 with 2 bare-metal stents placed at that time also with 70% mid LAD lesion that was not intervened upon,  hyperlipidemia  who presents for routine followup of his coronary artery disease  Last seen by myself in clinic January 2024 Doing well on today's visit Walks dogs No chest pain on exertion  01/06/24: has colonoscopy and EGD scheduled  Sedentary at baseline, no regular exercise program Likes to go fishing, getting his boat ready with new engine  Reports prescription for isosorbide  ran out, requesting refill  Lab work reviewed Total cholesterol 117 LDL 56  EKG personally reviewed by myself on todays visit EKG Interpretation Date/Time:  Tuesday November 12 2023 08:20:09 EST Ventricular Rate:  67 PR Interval:  128 QRS Duration:  94 QT Interval:  410 QTC Calculation: 433 R Axis:   66  Text Interpretation: Normal sinus rhythm Normal ECG When compared with ECG of 30-Aug-2020 08:27, No significant change was found Confirmed by Perla Lye [phone]) on 11/12/2023 8:23:51 AM    Other past medical history reviewed completed c4-5 anterior cervical fusion With Dr. Louis   Lab Results  Component Value Date   CHOL 117 11/05/2023   HDL 29 (L) 11/05/2023   LDLCALC 56 11/05/2023   TRIG 191 (H) 11/05/2023    PMH:   has a past medical history of Cholesterol blood lowered, Complex tear of lateral meniscus of right knee as current injury (03/20/2019), Complex tear of medial  meniscus of right knee as current injury (03/20/2019), Coronary artery disease, Dental bridge present, Elevated lipids, GERD (gastroesophageal reflux disease), History of Barrett's esophagus, History of rectal bleeding, Hypertension, Myocardial infarction (HCC) (08/2006), and PONV (postoperative nausea and vomiting).  PSH:    Past Surgical History:  Procedure Laterality Date   ANTERIOR CERVICAL DECOMP/DISCECTOMY FUSION N/A 09/06/2020   Procedure: Anterior Cervical Decompression Discectomy Fusion Cervical four-five;  Surgeon: Louis Shove, MD;  Location: [facility] OR;  Service: Neurosurgery;  Laterality: N/A;   APPENDECTOMY     CARDIAC CATHETERIZATION     CARPAL TUNNEL RELEASE Left 09/22/2018   Procedure: CARPAL TUNNEL RELEASE;  Surgeon: Mardee Lynwood SQUIBB, MD;  Location: [facility] ORS;  Service: Orthopedics;  Laterality: Left;   CATARACT EXTRACTION W/PHACO Right 01/02/2023   Procedure: CATARACT EXTRACTION PHACO AND INTRAOCULAR LENS PLACEMENT (IOC) RIGHT;  Surgeon: Mittie Gaskin, MD;  Location: [facility];  Service: Ophthalmology;  Laterality: Right;  9.73 1:06.4   CATARACT EXTRACTION W/PHACO Left 06/05/2023   Procedure: CATARACT EXTRACTION PHACO AND INTRAOCULAR LENS PLACEMENT (IOC) LEFT CLAREON TORIC 8.54 00:48.8;  Surgeon: Mittie Gaskin, MD;  Location: [facility];  Service: Ophthalmology;  Laterality: Left;   CHONDROPLASTY Right 05/11/2019   Procedure: CHONDROPLASTY;  Surgeon: Mardee Lynwood SQUIBB, MD;  Location: [facility] ORS;  Service: Orthopedics;  Laterality: Right;   COLON SURGERY     hyperplastic colon polyp removal   COLONOSCOPY WITH PROPOFOL  N/A  11/08/2017   Procedure: COLONOSCOPY WITH PROPOFOL ;  Surgeon: Viktoria Lamar DASEN, MD;  Location: [facility] ENDOSCOPY;  Service: Endoscopy;  Laterality: N/A;   CORONARY STENT PLACEMENT  2007   x2   ESOPHAGOGASTRODUODENOSCOPY N/A 11/20/2021   Procedure: ESOPHAGOGASTRODUODENOSCOPY (EGD);  Surgeon: Onita Elspeth Sharper, DO;  Location: [facility]  ENDOSCOPY;  Service: Gastroenterology;  Laterality: N/A;   ESOPHAGOGASTRODUODENOSCOPY (EGD) WITH PROPOFOL  N/A 11/08/2017   Procedure: ESOPHAGOGASTRODUODENOSCOPY (EGD) WITH PROPOFOL ;  Surgeon: Viktoria Lamar DASEN, MD;  Location: [facility] ENDOSCOPY;  Service: Endoscopy;  Laterality: N/A;   GANGLION CYST EXCISION Right    KNEE ARTHROSCOPY Right 05/11/2019   Procedure: ARTHROSCOPY KNEE- partial medial/lateral menisectomy ;  Surgeon: Mardee Lynwood SQUIBB, MD;  Location: [facility] ORS;  Service: Orthopedics;  Laterality: Right;   TONSILLECTOMY AND ADENOIDECTOMY     UPPER GI ENDOSCOPY  04/23/2014   barretts esophagus, repeat 04/2017    Current Outpatient Medications  Medication Sig Dispense Refill   acetaminophen  (TYLENOL ) 500 MG tablet Take 1,000 mg by mouth every 6 (six) hours as needed for moderate pain or headache.      aspirin  EC 81 MG tablet Take 1 tablet (81 mg total) by mouth daily. Swallow whole. 90 tablet 3   atorvastatin  (LIPITOR ) 80 MG tablet TAKE 1 TABLET EVERY DAY 90 tablet 0   clopidogrel  (PLAVIX ) 75 MG tablet TAKE 1 TABLET EVERY DAY 90 tablet 0   diphenhydrAMINE  (BENADRYL ) 50 MG tablet Take 50 mg by mouth at bedtime.     ezetimibe  (ZETIA ) 10 MG tablet TAKE 1 TABLET EVERY DAY 90 tablet 0   lisinopril  (ZESTRIL ) 10 MG tablet TAKE 1 TABLET EVERY DAY 90 tablet 0   metoprolol  succinate (TOPROL -XL) 25 MG 24 hr tablet TAKE 1 TABLET EVERY DAY 90 tablet 3   nitroGLYCERIN  (NITROSTAT ) 0.4 MG SL tablet DISSOLVE 1 TABLET UNDER TONGUE EVERY 5 MIN AS NEED FOR CHEST PAIN. CALL 911 IF NO RELIEF AFTER 3DOSE 25 tablet 0   pantoprazole  (PROTONIX ) 40 MG tablet Take 40 mg by mouth daily.     isosorbide  mononitrate (IMDUR ) 30 MG 24 hr tablet Take 1 tablet (30 mg total) by mouth daily. (Patient not taking: Reported on 11/12/2023) 90 tablet 3   No current facility-administered medications for this visit.     Allergies:   Codeine and Simvastatin   Social History:  The patient  reports that he quit smoking about 11 years  ago. His smoking use included cigarettes and e-cigarettes. He started smoking about 56 years ago. He has a 45 pack-year smoking history. He has never used smokeless tobacco. He reports current alcohol use of about 1.0 standard drink of alcohol per week. He reports that he does not use drugs.   Family History:   family history includes Heart disease in his father; Pulmonary fibrosis in his father.    Review of Systems: Review of Systems  Constitutional: Negative.   Respiratory: Negative.    Cardiovascular: Negative.   Gastrointestinal: Negative.   Musculoskeletal: Negative.   Neurological: Negative.   Psychiatric/Behavioral: Negative.    All other systems reviewed and are negative.   PHYSICAL EXAM: VS:  BP 118/72 (BP Location: Left Arm, Patient Position: Sitting, Cuff Size: Normal)   Pulse 67   Ht 5' 9 (1.753 m)   Wt 248 lb 8 oz (112.7 kg)   SpO2 95%   BMI 36.70 kg/m  , BMI Body mass index is 36.7 kg/m. Constitutional:  oriented to person, place, and time. No distress.  HENT:  Head: Grossly normal Eyes:  no discharge. No scleral icterus.  Neck: No JVD, no carotid bruits  Cardiovascular: Regular rate and rhythm, no murmurs appreciated Pulmonary/Chest: Clear to auscultation bilaterally, no wheezes or rails Abdominal: Soft.  no distension.  no tenderness.  Musculoskeletal: Normal range of motion Neurological:  normal muscle tone. Coordination normal. No atrophy Skin: Skin warm and dry Psychiatric: normal affect, pleasant  Recent Labs: 11/05/2023: ALT 35; BUN 14; Creatinine, Ser 1.08; Hemoglobin 14.7; Platelets 238; Potassium 4.8; Sodium 140    Lipid Panel Lab Results  Component Value Date   CHOL 117 11/05/2023   HDL 29 (L) 11/05/2023   LDLCALC 56 11/05/2023   TRIG 191 (H) 11/05/2023      Wt Readings from Last 3 Encounters:  11/12/23 248 lb 8 oz (112.7 kg)  11/05/23 248 lb (112.5 kg)  06/05/23 244 lb 1.6 oz (110.7 kg)      ASSESSMENT AND PLAN:  Preop  cardiovascular Acceptable risk for EGD and colonoscopy, no further cardiac testing needed Off Plavix  5 days prior to procedure, stay on aspirin    Atherosclerosis of native coronary artery of native heart without angina pectoris -  Currently with no symptoms of angina. No further workup at this time. Continue current medication regimen.  Hold Plavix  stay on aspirin  Non-smoker, cholesterol at goal, no diabetes Recommend regular walking program for conditioning Restart isosorbide  15 up to 30 mg as tolerated daily  Essential hypertension - Plan: EKG 12-Lead Blood pressure is well controlled on today's visit. No changes made to the medications.  We have placed a new refill for his isosorbide  at his request, he will start half a pill with titration up to whole pill if tolerated  SMOKER Stop smoking several years ago, 2013 mild COPD, reports breathing stable  Hyperlipidemia Cholesterol is at goal on the current lipid regimen. No changes to the medications were made.  AAA Recent ultrasound reviewed 2.7 cm on ultrasound December 2024 Also with aneurysmal dilation bilateral common iliac arteries, 1.9 cm    Orders Placed This Encounter  Procedures   EKG 12-Lead     Signed, Velinda Lunger, M.D., Ph.D. 11/12/2023  [facility] [place], [place] [phone]

## 2023-11-12 ENCOUNTER — Encounter: Payer: Self-pay | Admitting: Cardiovascular Disease

## 2023-11-12 ENCOUNTER — Ambulatory Visit: Payer: Medicare Other | Attending: Cardiovascular Disease | Admitting: Cardiovascular Disease

## 2023-11-12 VITALS — BP 118/72 | HR 67 | Ht 69.0 in | Wt 248.5 lb

## 2023-11-12 DIAGNOSIS — Z87891 Personal history of nicotine dependence: Secondary | ICD-10-CM

## 2023-11-12 DIAGNOSIS — I714 Abdominal aortic aneurysm, without rupture, unspecified: Secondary | ICD-10-CM

## 2023-11-12 DIAGNOSIS — E782 Mixed hyperlipidemia: Secondary | ICD-10-CM

## 2023-11-12 DIAGNOSIS — I739 Peripheral vascular disease, unspecified: Secondary | ICD-10-CM

## 2023-11-12 DIAGNOSIS — I25118 Atherosclerotic heart disease of native coronary artery with other forms of angina pectoris: Secondary | ICD-10-CM

## 2023-11-12 DIAGNOSIS — I10 Essential (primary) hypertension: Secondary | ICD-10-CM

## 2023-11-12 MED ORDER — METOPROLOL SUCCINATE ER 25 MG PO TB24: 25.0000 mg | ORAL_TABLET | Freq: Every day | ORAL | 3 refills | Status: AC

## 2023-11-12 MED ORDER — LISINOPRIL 10 MG PO TABS: ORAL_TABLET | ORAL | 3 refills | Status: DC

## 2023-11-12 MED ORDER — ATORVASTATIN CALCIUM 80 MG PO TABS: 80.0000 mg | ORAL_TABLET | Freq: Every day | ORAL | 3 refills | Status: DC

## 2023-11-12 MED ORDER — EZETIMIBE 10 MG PO TABS: 10.0000 mg | ORAL_TABLET | Freq: Every day | ORAL | 3 refills | Status: DC

## 2023-11-12 MED ORDER — ISOSORBIDE MONONITRATE ER 30 MG PO TB24: 30.0000 mg | ORAL_TABLET | Freq: Every day | ORAL | 3 refills | Status: DC

## 2023-11-12 MED ORDER — METOPROLOL SUCCINATE ER 25 MG PO TB24: 25.0000 mg | ORAL_TABLET | Freq: Every day | ORAL | 3 refills | Status: DC

## 2023-11-12 NOTE — Patient Instructions (Addendum)
 Medication Instructions:  Stop plavix /clopidogrel   Stay on aspirin   Restart isosorbide  If you get dizzy, cut the pill in 1/2  If you need a refill on your cardiac medications before your next appointment, please call your pharmacy.   Lab work: No new labs needed  Testing/Procedures: No new testing needed  Follow-Up: At [facility], you and your health needs are our priority.  As part of our continuing mission to provide you with exceptional heart care, we have created designated Provider Care Teams.  These Care Teams include your primary Cardiologist (physician) and Advanced Practice Providers (APPs -  Physician Assistants and Nurse Practitioners) who all work together to provide you with the care you need, when you need it.  You will need a follow up appointment in 12 months  Providers on your designated Care Team:   Lonni Meager, NP Bernardino Bring, PA-C Cadence [place], [place]  COVID-19 Vaccine Information can be found at: [email or web address] For questions related to vaccine distribution or appointments, please email [email or web address] or call [phone].

## 2023-11-15 ENCOUNTER — Other Ambulatory Visit: Payer: Self-pay | Admitting: Cardiovascular Disease

## 2023-12-04 ENCOUNTER — Ambulatory Visit: Payer: Medicare Other

## 2023-12-19 ENCOUNTER — Telehealth: Payer: Self-pay | Admitting: Cardiovascular Disease

## 2023-12-19 NOTE — Telephone Encounter (Signed)
   Pre-operative Risk Assessment    Patient Name: Patrick Barrera  DOB: 02/06/1956 MRN: [id number]   Date of last office visit: 11/12/23 Date of next office visit: n/a   Request for Surgical Clearance    Procedure:  Colonoscopy  Date of Surgery:  Clearance 01/06/24                                Surgeon:  n/a Surgeon's Group or Practice Name:  [facility] Phone number:  [phone] Fax number:  [phone]   Type of Clearance Requested:   - Pharmacy:  Hold Clopidogrel (Plavix)     Type of Anesthesia:  Not Indicated   Additional requests/questions:    Signed, Narda Amber   12/19/2023, 11:35 AM

## 2023-12-20 NOTE — Telephone Encounter (Addendum)
   Patient Name: Patrick Barrera  DOB: 10-Feb-1956 MRN: [id number]  Primary Cardiologist: Julien Nordmann, MD  Chart reviewed as part of pre-operative protocol coverage. Given past medical history and time since last visit, based on ACC/AHA guidelines, Patrick Barrera is at acceptable risk for the planned procedure without further cardiovascular testing. Pt was seen in clinic on 11/12/2023 by Dr. Mariah Milling.  Gollan, patient okay to proceed with colonoscopy.  He may hold Plavix for 5 days prior to procedure.  He should continue aspirin throughout the perioperative period. Please resume Plavix as soon as possible postprocedure, at the discretion of the surgeon.    I will route this recommendation as well as the note from most recent office visit to the requesting party via Epic fax function and remove from pre-op pool.  Please call with questions.  Joylene Grapes, NP 12/20/2023, 8:30 AM

## 2023-12-23 ENCOUNTER — Other Ambulatory Visit: Payer: Self-pay | Admitting: Emergency Medicine

## 2023-12-24 ENCOUNTER — Encounter: Payer: Self-pay | Admitting: Gastroenterology

## 2024-01-06 ENCOUNTER — Ambulatory Visit
Admission: RE | Admit: 2024-01-06 | Discharge: 2024-01-06 | Disposition: A | Discharge status: Home / Self Care | Payer: Medicare Other | Attending: Gastroenterology | Admitting: Gastroenterology

## 2024-01-06 ENCOUNTER — Ambulatory Visit: Admitting: Anesthesiology

## 2024-01-06 ENCOUNTER — Encounter: Payer: Self-pay | Admitting: Gastroenterology

## 2024-01-06 ENCOUNTER — Encounter
Admission: RE | Disposition: A | Discharge status: Home / Self Care | Payer: Self-pay | Source: Home / Self Care | Attending: Gastroenterology

## 2024-01-06 DIAGNOSIS — I252 Old myocardial infarction: Secondary | ICD-10-CM | POA: Insufficient documentation

## 2024-01-06 DIAGNOSIS — K640 First degree hemorrhoids: Secondary | ICD-10-CM | POA: Diagnosis not present

## 2024-01-06 DIAGNOSIS — Z87891 Personal history of nicotine dependence: Secondary | ICD-10-CM | POA: Diagnosis not present

## 2024-01-06 DIAGNOSIS — K5730 Diverticulosis of large intestine without perforation or abscess without bleeding: Secondary | ICD-10-CM | POA: Insufficient documentation

## 2024-01-06 DIAGNOSIS — K3189 Other diseases of stomach and duodenum: Secondary | ICD-10-CM | POA: Insufficient documentation

## 2024-01-06 DIAGNOSIS — I10 Essential (primary) hypertension: Secondary | ICD-10-CM | POA: Diagnosis not present

## 2024-01-06 DIAGNOSIS — I2510 Atherosclerotic heart disease of native coronary artery without angina pectoris: Secondary | ICD-10-CM | POA: Diagnosis not present

## 2024-01-06 DIAGNOSIS — I739 Peripheral vascular disease, unspecified: Secondary | ICD-10-CM | POA: Insufficient documentation

## 2024-01-06 DIAGNOSIS — D124 Benign neoplasm of descending colon: Secondary | ICD-10-CM | POA: Insufficient documentation

## 2024-01-06 DIAGNOSIS — Z1211 Encounter for screening for malignant neoplasm of colon: Secondary | ICD-10-CM | POA: Diagnosis present

## 2024-01-06 DIAGNOSIS — K2970 Gastritis, unspecified, without bleeding: Secondary | ICD-10-CM | POA: Insufficient documentation

## 2024-01-06 DIAGNOSIS — D123 Benign neoplasm of transverse colon: Secondary | ICD-10-CM | POA: Insufficient documentation

## 2024-01-06 DIAGNOSIS — K31A11 Gastric intestinal metaplasia without dysplasia, involving the antrum: Secondary | ICD-10-CM | POA: Diagnosis not present

## 2024-01-06 DIAGNOSIS — K219 Gastro-esophageal reflux disease without esophagitis: Secondary | ICD-10-CM | POA: Diagnosis not present

## 2024-01-06 DIAGNOSIS — K2270 Barrett's esophagus without dysplasia: Secondary | ICD-10-CM | POA: Insufficient documentation

## 2024-01-06 DIAGNOSIS — K259 Gastric ulcer, unspecified as acute or chronic, without hemorrhage or perforation: Secondary | ICD-10-CM | POA: Diagnosis not present

## 2024-01-06 DIAGNOSIS — K2980 Duodenitis without bleeding: Secondary | ICD-10-CM | POA: Diagnosis not present

## 2024-01-06 HISTORY — DX: Male erectile dysfunction, unspecified: N52.9

## 2024-01-06 HISTORY — DX: Other forms of dyspnea: R06.09

## 2024-01-06 HISTORY — DX: Barrett's esophagus without dysplasia: K22.70

## 2024-01-06 HISTORY — DX: Unspecified osteoarthritis, unspecified site: M19.90

## 2024-01-06 HISTORY — DX: Solitary pulmonary nodule: R91.1

## 2024-01-06 HISTORY — DX: Atherosclerosis of aorta: I70.0

## 2024-01-06 HISTORY — DX: Other specified abnormal findings of blood chemistry: R79.89

## 2024-01-06 HISTORY — DX: Peripheral vascular disease, unspecified: I73.9

## 2024-01-06 HISTORY — DX: Personal history of adenomatous and serrated colon polyps: Z86.0101

## 2024-01-06 HISTORY — DX: Diverticulosis of large intestine without perforation or abscess without bleeding: K57.30

## 2024-01-06 HISTORY — DX: Other lipodystrophy, not elsewhere classified: E88.19

## 2024-01-06 HISTORY — DX: Other specified symptoms and signs involving the circulatory and respiratory systems: R09.89

## 2024-01-06 HISTORY — DX: Lipodystrophy, not elsewhere classified: E88.1

## 2024-01-06 HISTORY — DX: Aortic aneurysm of unspecified site, without rupture: I71.9

## 2024-01-06 SURGERY — COLONOSCOPY WITH PROPOFOL
Anesthesia: General

## 2024-01-06 MED ORDER — LIDOCAINE HCL (CARDIAC) PF 100 MG/5ML IV SOSY
PREFILLED_SYRINGE | INTRAVENOUS | Status: DC | PRN
  Administered 2024-01-06: 100 mg via INTRAVENOUS

## 2024-01-06 MED ORDER — PROPOFOL 1000 MG/100ML IV EMUL
INTRAVENOUS | Status: AC
Start: 2024-01-06 — End: ?
  Filled 2024-01-06: qty 100

## 2024-01-06 MED ORDER — SODIUM CHLORIDE 0.9 % IV SOLN: INTRAVENOUS | Status: DC

## 2024-01-06 MED ORDER — PROPOFOL 10 MG/ML IV BOLUS
INTRAVENOUS | Status: DC | PRN
  Administered 2024-01-06: 20 mg via INTRAVENOUS
  Administered 2024-01-06: 40 mg via INTRAVENOUS

## 2024-01-06 MED ORDER — PROPOFOL 500 MG/50ML IV EMUL
INTRAVENOUS | Status: DC | PRN
  Administered 2024-01-06: 75 ug/kg/min via INTRAVENOUS

## 2024-01-06 MED ORDER — GLYCOPYRROLATE 0.2 MG/ML IJ SOLN
INTRAMUSCULAR | Status: AC
  Filled 2024-01-06: qty 1

## 2024-01-06 MED ORDER — GLYCOPYRROLATE 0.2 MG/ML IJ SOLN
INTRAMUSCULAR | Status: DC | PRN
  Administered 2024-01-06: .2 mg via INTRAVENOUS

## 2024-01-06 MED ORDER — DEXMEDETOMIDINE HCL IN NACL 80 MCG/20ML IV SOLN
INTRAVENOUS | Status: DC | PRN
  Administered 2024-01-06: 20 ug via INTRAVENOUS

## 2024-01-06 NOTE — H&P (Signed)
 Pre-Procedure H&P   Patient ID: Patrick Barrera is a 68 y.o. male.  Gastroenterology Provider: [name], DO  Referring Provider: Jacob Moores, PA PCP: Malva Limes, MD  Date: 01/06/2024  HPI Patrick Barrera is a 68 y.o. male who presents today for Esophagogastroduodenoscopy and Colonoscopy for GERD, Barrett's esophagus, gastritis, duodenitis, personal history of colon polyps .  Undergoing colonoscopy for personal history of colon polyps.  Last underwent colonoscopy in 2019 with 1 adenomatous polyp and descending diverticulosis.  Internal hemorrhoids also noted  EGD in 2023 with short segment Barrett's esophagus without dysplasia.  Approximately 3 cm in length.  GEJ at 39 cm.  Was noted to have a gastric ulcer at that point in time along with gastritis and duodenitis as well.  No dysphagia or odynophagia.  Reports regular bowel movements without melena or hematochezia  Has been off plavix since February   Past Medical History:  Diagnosis Date   Abdominal bruit    Aortic aneurysm (HCC)    Arthritis    Atherosclerosis of aorta (HCC)    Barrett esophagus    Cholesterol blood lowered    Complex tear of lateral meniscus of right knee as current injury 03/20/2019   Complex tear of medial meniscus of right knee as current injury 03/20/2019   Coronary artery disease    Cutaneous lipodystrophy    Dental bridge present    permanent.  Bottom front   Diverticulosis of colon    DOE (dyspnea on exertion)    ED (erectile dysfunction) of organic origin    Elevated lipids    GERD (gastroesophageal reflux disease)    History of adenomatous polyp of colon    History of Barrett's esophagus    History of rectal bleeding    Hypertension    Low testosterone in male    Lung nodule    Myocardial infarction (HCC) 08/2006   With occlusion of RCA, stents x2   Peripheral artery disease (HCC)    PONV (postoperative nausea and vomiting)     Past Surgical History:  Procedure  Laterality Date   ANTERIOR CERVICAL DECOMP/DISCECTOMY FUSION N/A 09/06/2020   Procedure: Anterior Cervical Decompression Discectomy Fusion Cervical four-five;  Surgeon: Julio Sicks, MD;  Location: [facility] OR;  Service: Neurosurgery;  Laterality: N/A;   APPENDECTOMY     BACK SURGERY     CARDIAC CATHETERIZATION     CARPAL TUNNEL RELEASE Left 09/22/2018   Procedure: CARPAL TUNNEL RELEASE;  Surgeon: Donato Heinz, MD;  Location: [facility] ORS;  Service: Orthopedics;  Laterality: Left;   CATARACT EXTRACTION W/PHACO Right 01/02/2023   Procedure: CATARACT EXTRACTION PHACO AND INTRAOCULAR LENS PLACEMENT (IOC) RIGHT;  Surgeon: Lockie Mola, MD;  Location: [facility];  Service: Ophthalmology;  Laterality: Right;  9.73 1:06.4   CATARACT EXTRACTION W/PHACO Left 06/05/2023   Procedure: CATARACT EXTRACTION PHACO AND INTRAOCULAR LENS PLACEMENT (IOC) LEFT CLAREON TORIC 8.54 00:48.8;  Surgeon: Lockie Mola, MD;  Location: [facility];  Service: Ophthalmology;  Laterality: Left;   CHONDROPLASTY Right 05/11/2019   Procedure: CHONDROPLASTY;  Surgeon: Donato Heinz, MD;  Location: [facility] ORS;  Service: Orthopedics;  Laterality: Right;   COLON SURGERY     hyperplastic colon polyp removal   COLONOSCOPY WITH PROPOFOL N/A 11/08/2017   Procedure: COLONOSCOPY WITH PROPOFOL;  Surgeon: Scot Jun, MD;  Location: [facility] ENDOSCOPY;  Service: Endoscopy;  Laterality: N/A;   CORONARY ANGIOPLASTY     CORONARY STENT PLACEMENT  2007   x2  ESOPHAGOGASTRODUODENOSCOPY N/A 11/20/2021   Procedure: ESOPHAGOGASTRODUODENOSCOPY (EGD);  Surgeon: [name], DO;  Location: [facility] ENDOSCOPY;  Service: Gastroenterology;  Laterality: N/A;   ESOPHAGOGASTRODUODENOSCOPY (EGD) WITH PROPOFOL N/A 11/08/2017   Procedure: ESOPHAGOGASTRODUODENOSCOPY (EGD) WITH PROPOFOL;  Surgeon: Scot Jun, MD;  Location: [facility] END[facility]COPY;  Service: Endoscopy;  Laterality: N/A;   EYE SURGERY     GANGLION CYST EXCISION  Right    JOINT REPLACEMENT     KNEE ARTHROSCOPY Right 05/11/2019   Procedure: ARTHROSCOPY KNEE- partial medial/lateral menisectomy ;  Surgeon: Donato Heinz, MD;  Location: [facility] ORS;  Service: Orthopedics;  Laterality: Right;   TONSILLECTOMY AND ADENOIDECTOMY     UPPER GI ENDOSCOPY  04/23/2014   barretts esophagus, repeat 04/2017    Family History No h/o GI disease or malignancy  Review of Systems  Constitutional:  Negative for activity change, appetite change, chills, diaphoresis, fatigue, fever and unexpected weight change.  HENT:  Negative for trouble swallowing and voice change.   Respiratory:  Negative for shortness of breath and wheezing.   Cardiovascular:  Negative for chest pain, palpitations and leg swelling.  Gastrointestinal:  Negative for abdominal distention, abdominal pain, anal bleeding, blood in stool, constipation, diarrhea, nausea and vomiting.  Musculoskeletal:  Negative for arthralgias and myalgias.  Skin:  Negative for color change and pallor.  Neurological:  Negative for dizziness, syncope and weakness.  Psychiatric/Behavioral:  Negative for confusion. The patient is not nervous/anxious.   All other systems reviewed and are negative.    Medications No current facility-administered medications on file prior to encounter.   Current Outpatient Medications on File Prior to Encounter  Medication Sig Dispense Refill   aspirin EC 81 MG tablet Take 1 tablet (81 mg total) by mouth daily. Swallow whole. 90 tablet 3   isosorbide mononitrate (IMDUR) 30 MG 24 hr tablet Take 1 tablet (30 mg total) by mouth daily. 90 tablet 3   lisinopril (ZESTRIL) 10 MG tablet TAKE 1 TABLET EVERY DAY 90 tablet 3   metoprolol succinate (TOPROL-XL) 25 MG 24 hr tablet Take 1 tablet (25 mg total) by mouth daily. 90 tablet 3   acetaminophen (TYLENOL) 500 MG tablet Take 1,000 mg by mouth every 6 (six) hours as needed for moderate pain or headache.      atorvastatin (LIPITOR) 80 MG tablet TAKE 1  TABLET EVERY DAY 90 tablet 3   clopidogrel (PLAVIX) 75 MG tablet Take 75 mg by mouth daily. (Patient not taking: Reported on 12/30/2023)     diphenhydrAMINE (BENADRYL) 50 MG tablet Take 50 mg by mouth at bedtime.     ezetimibe (ZETIA) 10 MG tablet TAKE 1 TABLET EVERY DAY 90 tablet 3   nitroGLYCERIN (NITROSTAT) 0.4 MG SL tablet DISSOLVE 1 TABLET UNDER TONGUE EVERY 5 MIN AS NEED FOR CHEST PAIN. CALL 911 IF NO RELIEF AFTER 3DOSE 25 tablet 0   pantoprazole (PROTONIX) 40 MG tablet Take 40 mg by mouth daily.      Pertinent medications related to GI and procedure were reviewed by me with the patient prior to the procedure   Current Facility-Administered Medications:    0.9 %  sodium chloride infusion, , Intravenous, Continuous, [name], DO, Last Rate: 20 mL/hr at 01/06/24 1233, New Bag at 01/06/24 1233  sodium chloride 20 mL/hr at 01/06/24 1233       Allergies  Allergen Reactions   Codeine Nausea Only   Simvastatin     Muscle ache    Allergies were reviewed by me prior to  the procedure  Objective   Body mass index is 35.06 kg/m. Vitals:   01/06/24 1213  BP: (!) 139/96  Pulse: 79  Resp: 17  Temp: 97.6 F (36.4 C)  TempSrc: Temporal  SpO2: 97%  Weight: 107.7 kg  Height: 5\' 9"  (1.753 m)     Physical Exam Vitals and nursing note reviewed.  Constitutional:      General: He is not in acute distress.    Appearance: Normal appearance. He is not ill-appearing, toxic-appearing or diaphoretic.  HENT:     Head: Normocephalic and atraumatic.     Nose: Nose normal.     Mouth/Throat:     Mouth: Mucous membranes are moist.     Pharynx: Oropharynx is clear.  Eyes:     General: No scleral icterus.    Extraocular Movements: Extraocular movements intact.  Cardiovascular:     Rate and Rhythm: Normal rate and regular rhythm.     Heart sounds: Normal heart sounds. No murmur heard.    No friction rub. No gallop.  Pulmonary:     Effort: Pulmonary effort is normal. No  respiratory distress.     Breath sounds: Normal breath sounds. No wheezing, rhonchi or rales.  Abdominal:     General: Bowel sounds are normal. There is no distension.     Palpations: Abdomen is soft.     Tenderness: There is no abdominal tenderness. There is no guarding or rebound.  Musculoskeletal:     Cervical back: Neck supple.     Right lower leg: No edema.     Left lower leg: No edema.  Skin:    General: Skin is warm and dry.     Coloration: Skin is not jaundiced or pale.  Neurological:     General: No focal deficit present.     Mental Status: He is alert and oriented to person, place, and time. Mental status is at baseline.  Psychiatric:        Mood and Affect: Mood normal.        Behavior: Behavior normal.        Thought Content: Thought content normal.        Judgment: Judgment normal.      Assessment:  Patrick Barrera is a 68 y.o. male  who presents today for Esophagogastroduodenoscopy and Colonoscopy for GERD, Barrett's esophagus, gastritis, duodenitis, personal history of colon polyps .  Plan:  Esophagogastroduodenoscopy and Colonoscopy with possible intervention today  Esophagogastroduodenoscopy and Colonoscopy with possible biopsy, control of bleeding, polypectomy, and interventions as necessary has been discussed with the patient/patient representative. Informed consent was obtained from the patient/patient representative after explaining the indication, nature, and risks of the procedure including but not limited to death, bleeding, perforation, missed neoplasm/lesions, cardiorespiratory compromise, and reaction to medications. Opportunity for questions was given and appropriate answers were provided. Patient/patient representative has verbalized understanding is amenable to undergoing the procedure.   [name], DO  [facility]  Portions of the record may have been created with voice recognition software. Occasional wrong-word or  'sound-a-like' substitutions may have occurred due to the inherent limitations of voice recognition software.  Read the chart carefully and recognize, using context, where substitutions may have occurred.

## 2024-01-06 NOTE — Anesthesia Postprocedure Evaluation (Signed)
 Anesthesia Post Note  Patient: Patrick Barrera  Procedure(s) Performed: COLONOSCOPY WITH PROPOFOL ESOPHAGOGASTRODUODENOSCOPY (EGD) WITH PROPOFOL POLYPECTOMY  Patient location during evaluation: Endoscopy Anesthesia Type: General Level of consciousness: awake and alert Pain management: pain level controlled Vital Signs Assessment: post-procedure vital signs reviewed and stable Respiratory status: spontaneous breathing, nonlabored ventilation, respiratory function stable and patient connected to nasal cannula oxygen Cardiovascular status: blood pressure returned to baseline and stable Postop Assessment: no apparent nausea or vomiting Anesthetic complications: no   No notable events documented.   Last Vitals:  Vitals:   01/06/24 1213 01/06/24 1345  BP: (!) 139/96   Pulse: 79   Resp: 17   Temp: 36.4 C (!) 36 C  SpO2: 97%     Last Pain:  Vitals:   01/06/24 1404  TempSrc:   PainSc: 0-No pain                 Lenard Simmer

## 2024-01-06 NOTE — Anesthesia Preprocedure Evaluation (Signed)
 Anesthesia Evaluation  Patient identified by MRN, date of birth, ID band Patient awake    Reviewed: Allergy & Precautions, NPO status , Patient's Chart, lab work & pertinent test results  History of Anesthesia Complications (+) PONV and history of anesthetic complications  Airway Mallampati: IV   Neck ROM: Full    Dental  (+) Chipped, Dental Advidsory Given Bridge :   Pulmonary neg pulmonary ROS, former smoker   Pulmonary exam normal breath sounds clear to auscultation       Cardiovascular hypertension, (-) angina + CAD (s/p MI and stents on Plavix), + Past MI, + Cardiac Stents and + Peripheral Vascular Disease  (-) CABG Normal cardiovascular exam(-) dysrhythmias  Rhythm:Regular Rate:Normal     Neuro/Psych negative neurological ROS     GI/Hepatic Neg liver ROS,GERD (Barrett esophagus)  ,,  Endo/Other  Obesity   Renal/GU negative Renal ROS     Musculoskeletal  (+) Arthritis ,    Abdominal   Peds  Hematology negative hematology ROS (+)   Anesthesia Other Findings Past Medical History: No date: Abdominal bruit No date: Aortic aneurysm (HCC) No date: Arthritis No date: Atherosclerosis of aorta (HCC) No date: Barrett esophagus No date: Cholesterol blood lowered 03/20/2019: Complex tear of lateral meniscus of right knee as current  injury 03/20/2019: Complex tear of medial meniscus of right knee as current  injury No date: Coronary artery disease No date: Cutaneous lipodystrophy No date: Dental bridge present     Comment:  permanent.  Bottom front No date: Diverticulosis of colon No date: DOE (dyspnea on exertion) No date: ED (erectile dysfunction) of organic origin No date: Elevated lipids No date: GERD (gastroesophageal reflux disease) No date: History of adenomatous polyp of colon No date: History of Barrett's esophagus No date: History of rectal bleeding No date: Hypertension No date: Low testosterone  in male No date: Lung nodule 08/2006: Myocardial infarction (HCC)     Comment:  With occlusion of RCA, stents x2 No date: Peripheral artery disease (HCC) No date: PONV (postoperative nausea and vomiting)   Reproductive/Obstetrics negative OB ROS                             Anesthesia Physical Anesthesia Plan  ASA: 3  Anesthesia Plan: General   Post-op Pain Management:    Induction: Intravenous  PONV Risk Score and Plan: 3 and Treatment may vary due to age or medical condition, TIVA and Propofol infusion  Airway Management Planned: Natural Airway and Nasal Cannula  Additional Equipment:   Intra-op Plan:   Post-operative Plan:   Informed Consent: I have reviewed the patients History and Physical, chart, labs and discussed the procedure including the risks, benefits and alternatives for the proposed anesthesia with the patient or authorized representative who has indicated his/her understanding and acceptance.     Dental advisory given  Plan Discussed with: CRNA  Anesthesia Plan Comments: (LMA/GETA backup discussed.  Patient consented for risks of anesthesia including but not limited to:  - adverse reactions to medications - damage to eyes, teeth, lips or other oral mucosa - nerve damage due to positioning  - sore throat or hoarseness - damage to heart, brain, nerves, lungs, other parts of body or loss of life  Informed patient about role of CRNA in peri- and intra-operative care.  Patient voiced understanding.)        Anesthesia Quick Evaluation

## 2024-01-06 NOTE — Transfer of Care (Signed)
 Immediate Anesthesia Transfer of Care Note  Patient: Patrick Barrera  Procedure(s) Performed: COLONOSCOPY WITH PROPOFOL ESOPHAGOGASTRODUODENOSCOPY (EGD) WITH PROPOFOL  Patient Location: PACU  Anesthesia Type:General  Level of Consciousness: sedated  Airway & Oxygen Therapy: Patient Spontanous Breathing  Post-op Assessment: Report given to RN and Post -op Vital signs reviewed and stable  Post vital signs: Reviewed and stable  Last Vitals:  Vitals Value Taken Time  BP 118/77 01/06/24 1345  Temp 36 C 01/06/24 1345  Pulse 78 01/06/24 1345  Resp 22 01/06/24 1345  SpO2 96 % 01/06/24 1345  Vitals shown include unfiled device data.  Last Pain:  Vitals:   01/06/24 1345  TempSrc:   PainSc: 0-No pain         Complications: No notable events documented.

## 2024-01-06 NOTE — Op Note (Signed)
 [facility] Patient Name: Patrick Barrera Procedure Date: 01/06/2024 1:00 PM MRN: [id number] Account #: [id number] Date of Birth: 11/01/1955 Admit Type: Outpatient Age: 68 Room: [facility] ENDO ROOM 2 Gender: Male Note Status: Finalized Instrument Name: Colonoscope [id number] Procedure:             Colonoscopy Indications:           High risk colon cancer surveillance: Personal history                         of colonic polyps Providers:             Trenda Moots, DO Referring MD:          [name] DO, DO (Referring MD), Demetrios Isaacs.                         Sherrie Mustache, MD (Referring MD) Medicines:             Monitored Anesthesia Care Complications:         No immediate complications. Estimated blood loss:                         Minimal. Procedure:             Pre-Anesthesia Assessment:                        - Prior to the procedure, a History and Physical was                         performed, and patient medications and allergies were                         reviewed. The patient is competent. The risks and                         benefits of the procedure and the sedation options and                         risks were discussed with the patient. All questions                         were answered and informed consent was obtained.                         Patient identification and proposed procedure were                         verified by the physician, the nurse, the anesthetist                         and the technician in the endoscopy suite. Mental                         Status Examination: alert and oriented. Airway                         Examination: normal oropharyngeal airway and neck  mobility. Respiratory Examination: clear to                         auscultation. CV Examination: RRR, no murmurs, no S3                         or S4. Prophylactic Antibiotics: The patient does not                          require prophylactic antibiotics. Prior                         Anticoagulants: The patient has taken no anticoagulant                         or antiplatelet agents. ASA Grade Assessment: III - A                         patient with severe systemic disease. After reviewing                         the risks and benefits, the patient was deemed in                         satisfactory condition to undergo the procedure. The                         anesthesia plan was to use monitored anesthesia care                         (MAC). Immediately prior to administration of                         medications, the patient was re-assessed for adequacy                         to receive sedatives. The heart rate, respiratory                         rate, oxygen saturations, blood pressure, adequacy of                         pulmonary ventilation, and response to care were                         monitored throughout the procedure. The physical                         status of the patient was re-assessed after the                         procedure.                        After obtaining informed consent, the colonoscope was                         passed under direct vision. Throughout the procedure,  the patient's blood pressure, pulse, and oxygen                         saturations were monitored continuously. The                         Colonoscope was introduced through the anus and                         advanced to the the cecum, identified by appendiceal                         orifice and ileocecal valve. The colonoscopy was                         performed without difficulty. The patient tolerated                         the procedure well. The quality of the bowel                         preparation was evaluated using the BBPS [facility] Bowel                         Preparation Scale) with scores of: Right Colon = 2                         (minor amount of residual  staining, small fragments of                         stool and/or opaque liquid, but mucosa seen well),                         Transverse Colon = 2 (minor amount of residual                         staining, small fragments of stool and/or opaque                         liquid, but mucosa seen well) and Left Colon = 3                         (entire mucosa seen well with no residual staining,                         small fragments of stool or opaque liquid). The total                         BBPS score equals 7. The quality of the bowel                         preparation was good. The ileocecal valve, appendiceal                         orifice, and rectum were photographed. Findings:      The perianal and digital rectal examinations were normal. Pertinent       negatives include  normal sphincter tone.      Multiple small-mouthed diverticula were found in the left colon.       Estimated blood loss: none.      Non-bleeding internal hemorrhoids were found during retroflexion. The       hemorrhoids were Grade I (internal hemorrhoids that do not prolapse).       Estimated blood loss: none.      Three sessile polyps were found in the descending colon (1) and       transverse colon (2). The polyps were 1 to 2 mm in size. These polyps       were removed with a jumbo cold forceps. Resection and retrieval were       complete. Estimated blood loss was minimal.      The exam was otherwise without abnormality on direct and retroflexion       views. Impression:            - Diverticulosis in the left colon.                        - Non-bleeding internal hemorrhoids.                        - Three 1 to 2 mm polyps in the descending colon and                         in the transverse colon, removed with a jumbo cold                         forceps. Resected and retrieved.                        - The examination was otherwise normal on direct and                         retroflexion  views. Recommendation:        - Patient has a contact number available for                         emergencies. The signs and symptoms of potential                         delayed complications were discussed with the patient.                         Return to normal activities tomorrow. Written                         discharge instructions were provided to the patient.                        - Discharge patient to home.                        - Resume previous diet.                        - Continue present medications.                        - Await pathology  results.                        - Repeat colonoscopy for surveillance based on                         pathology results.                        - Return to referring physician as previously                         scheduled.                        - The findings and recommendations were discussed with                         the patient. Procedure Code(s):     --- Professional ---                        [phone], Colonoscopy, flexible; with biopsy, single or                         multiple Diagnosis Code(s):     --- Professional ---                        Z86.010, Personal history of colonic polyps                        K64.0, First degree hemorrhoids                        D12.4, Benign neoplasm of descending colon                        D12.3, Benign neoplasm of transverse colon (hepatic                         flexure or splenic flexure)                        K57.30, Diverticulosis of large intestine without                         perforation or abscess without bleeding CPT copyright 2022 American Medical Association. All rights reserved. The codes documented in this report are preliminary and upon coder review may  be revised to meet current compliance requirements. Attending Participation:      I personally performed the entire procedure. Elfredia Nevins, DO [name] DO, DO 01/06/2024 1:44:56 PM This report  has been signed electronically. Number of Addenda: 0 Note Initiated On: 01/06/2024 1:00 PM Scope Withdrawal Time: 0 hours 12 minutes 11 seconds  Total Procedure Duration: 0 hours 16 minutes 54 seconds  Estimated Blood Loss:  Estimated blood loss was minimal.      [facility]

## 2024-01-06 NOTE — Interval H&P Note (Signed)
 History and Physical Interval Note: Preprocedure H&P from 01/06/24  was reviewed and there was no interval change after seeing and examining the patient.  Written consent was obtained from the patient after discussion of risks, benefits, and alternatives. Patient has consented to proceed with Esophagogastroduodenoscopy and Colonoscopy with possible intervention   01/06/2024 12:59 PM  Patrick Barrera  has presented today for surgery, with the diagnosis of K21.9 (ICD-10-CM) - GERD without esophagitis K22.70 (ICD-10-CM) - Short-segment Barrett's esophagus Z87.11 (ICD-10-CM) - History of gastric ulcer K29.90 (ICD-10-CM) - Gastritis and duodenitis Z86.0101 (ICD-10-CM) - Hx of adenomatous colonic polyps.  The various methods of treatment have been discussed with the patient and family. After consideration of risks, benefits and other options for treatment, the patient has consented to  Procedure(s): COLONOSCOPY WITH PROPOFOL (N/A) ESOPHAGOGASTRODUODENOSCOPY (EGD) WITH PROPOFOL (N/A) as a surgical intervention.  The patient's history has been reviewed, patient examined, no change in status, stable for surgery.  I have reviewed the patient's chart and labs.  Questions were answered to the patient's satisfaction.     [name]

## 2024-01-06 NOTE — Op Note (Signed)
 [facility] Patient Name: Patrick Barrera Procedure Date: 01/06/2024 1:01 PM MRN: [id number] Account #: [id number] Date of Birth: Feb 09, 1956 Admit Type: Outpatient Age: 68 Room: [facility] ENDO ROOM 2 Gender: Male Note Status: Finalized Instrument Name: Upper Endoscope [id number] Procedure:             Upper GI endoscopy Indications:           Surveillance for malignancy due to personal history of                         Barrett's esophagus Providers:             Trenda Moots, DO Referring MD:          [name] DO, DO (Referring MD), Demetrios Isaacs.                         Sherrie Mustache, MD (Referring MD) Medicines:             Monitored Anesthesia Care Complications:         No immediate complications. Estimated blood loss:                         Minimal. Procedure:             Pre-Anesthesia Assessment:                        - Prior to the procedure, a History and Physical was                         performed, and patient medications and allergies were                         reviewed. The patient is competent. The risks and                         benefits of the procedure and the sedation options and                         risks were discussed with the patient. All questions                         were answered and informed consent was obtained.                         Patient identification and proposed procedure were                         verified by the physician, the nurse, the anesthetist                         and the technician in the endoscopy suite. Mental                         Status Examination: alert and oriented. Airway                         Examination: normal oropharyngeal airway and neck  mobility. Respiratory Examination: clear to                         auscultation. CV Examination: RRR, no murmurs, no S3                         or S4. Prophylactic Antibiotics: The patient does not                          require prophylactic antibiotics. Prior                         Anticoagulants: The patient has taken no anticoagulant                         or antiplatelet agents. ASA Grade Assessment: III - A                         patient with severe systemic disease. After reviewing                         the risks and benefits, the patient was deemed in                         satisfactory condition to undergo the procedure. The                         anesthesia plan was to use monitored anesthesia care                         (MAC). Immediately prior to administration of                         medications, the patient was re-assessed for adequacy                         to receive sedatives. The heart rate, respiratory                         rate, oxygen saturations, blood pressure, adequacy of                         pulmonary ventilation, and response to care were                         monitored throughout the procedure. The physical                         status of the patient was re-assessed after the                         procedure.                        After obtaining informed consent, the endoscope was                         passed under direct vision. Throughout the procedure,  the patient's blood pressure, pulse, and oxygen                         saturations were monitored continuously. The Endoscope                         was introduced through the mouth, and advanced to the                         second part of duodenum. The upper GI endoscopy was                         accomplished without difficulty. The patient tolerated                         the procedure well. Findings:      The duodenal bulb, first portion of the duodenum and second portion of       the duodenum were normal. Estimated blood loss: none.      A single localized 1 to 2 mm erosion with no bleeding and no stigmata of       recent bleeding was found in the gastric  antrum. Biopsies were taken       with a cold forceps for histology. Estimated blood loss was minimal.      The exam of the stomach was otherwise normal.      Esophagogastric landmarks were identified: the gastroesophageal junction       was found at 39 cm from the incisors.      There were esophageal mucosal changes classified as Barrett's stage       C1-M3 per Prague criteria present in the lower third of the esophagus.       The maximum longitudinal extent of these mucosal changes was 3 cm in       length. Mucosa was biopsied with a cold forceps for histology. A total       of 3 specimen bottles were sent to pathology. biopsies taken in 4       quadrant fashion Estimated blood loss was minimal.      The exam of the esophagus was otherwise normal. Impression:            - Normal duodenal bulb, first portion of the duodenum                         and second portion of the duodenum.                        - Erosive gastropathy with no bleeding and no stigmata                         of recent bleeding. Biopsied.                        - Esophagogastric landmarks identified.                        - Esophageal mucosal changes classified as Barrett's                         stage C1-M3 per Prague criteria. Biopsied. Recommendation:        -  Patient has a contact number available for                         emergencies. The signs and symptoms of potential                         delayed complications were discussed with the patient.                         Return to normal activities tomorrow. Written                         discharge instructions were provided to the patient.                        - Discharge patient to home.                        - Resume previous diet.                        - Continue present medications.                        - Await pathology results.                        - Repeat upper endoscopy for surveillance based on                         pathology  results.                        - Return to referring physician as previously                         scheduled.                        - proceed with colonoscopy. see report for further                         recommendations.                        - The findings and recommendations were discussed with                         the patient. Procedure Code(s):     --- Professional ---                        [phone], Esophagogastroduodenoscopy, flexible,                         transoral; with biopsy, single or multiple Diagnosis Code(s):     --- Professional ---                        K22.70, Barrett's esophagus without dysplasia                        K31.89, Other diseases of stomach and duodenum CPT copyright 2022  American Medical Association. All rights reserved. The codes documented in this report are preliminary and upon coder review may  be revised to meet current compliance requirements. Attending Participation:      I personally performed the entire procedure. Elfredia Nevins, DO [name] DO, DO 01/06/2024 1:19:42 PM This report has been signed electronically. Number of Addenda: 0 Note Initiated On: 01/06/2024 1:01 PM Estimated Blood Loss:  Estimated blood loss was minimal.      [facility]

## 2024-01-07 ENCOUNTER — Encounter: Payer: Self-pay | Admitting: Gastroenterology

## 2024-01-08 LAB — SURGICAL PATHOLOGY

## 2024-02-03 ENCOUNTER — Ambulatory Visit (INDEPENDENT_AMBULATORY_CARE_PROVIDER_SITE_OTHER): Payer: Medicare Other | Admitting: Family Medicine

## 2024-02-03 ENCOUNTER — Encounter: Payer: Self-pay | Admitting: Family Medicine

## 2024-02-03 VITALS — BP 139/75 | HR 66 | Resp 16 | Wt 245.3 lb

## 2024-02-03 DIAGNOSIS — E119 Type 2 diabetes mellitus without complications: Secondary | ICD-10-CM | POA: Diagnosis not present

## 2024-02-03 LAB — POCT GLYCOSYLATED HEMOGLOBIN (HGB A1C)
Est. average glucose Bld gHb Est-mCnc: 143
Hemoglobin A1C: 6.6 % — AB (ref 4.0–5.6)

## 2024-02-03 NOTE — Progress Notes (Signed)
      Established patient visit   Patient: Patrick Barrera   DOB: 1956-02-19   68 y.o. Male  MRN: [id number] Visit Date: 02/03/2024  Today's healthcare provider: Jeralene Mom, MD    Subjective    HPI  Follow up hyperglycemia on labs from CPE in January. Feels well, no c/o. Fairly sedentary with winter weather. Does not consume a lot of sweets, but does eat white/wheat bread and white potatoes. Not yet made any dietary changes.   Glucose  Date Value Ref Range Status  11/05/2023 133 (H) 70 - 99 mg/dL Final  74/25/9563 875 (H) 70 - 99 mg/dL Final  64/33/2951 884 (H) 70 - 99 mg/dL Final     Medications: Outpatient Medications Prior to Visit  Medication Sig Note   acetaminophen  (TYLENOL ) 500 MG tablet Take 1,000 mg by mouth every 6 (six) hours as needed for moderate pain or headache.     aspirin  EC 81 MG tablet Take 1 tablet (81 mg total) by mouth daily. Swallow whole.    atorvastatin  (LIPITOR ) 80 MG tablet TAKE 1 TABLET EVERY DAY    diphenhydrAMINE  (BENADRYL ) 50 MG tablet Take 50 mg by mouth at bedtime.    ezetimibe  (ZETIA ) 10 MG tablet TAKE 1 TABLET EVERY DAY    isosorbide  mononitrate (IMDUR ) 30 MG 24 hr tablet Take 1 tablet (30 mg total) by mouth daily.    lisinopril  (ZESTRIL ) 10 MG tablet TAKE 1 TABLET EVERY DAY    metoprolol  succinate (TOPROL -XL) 25 MG 24 hr tablet Take 1 tablet (25 mg total) by mouth daily.    nitroGLYCERIN  (NITROSTAT ) 0.4 MG SL tablet DISSOLVE 1 TABLET UNDER TONGUE EVERY 5 MIN AS NEED FOR CHEST PAIN. CALL 911 IF NO RELIEF AFTER 3DOSE    pantoprazole  (PROTONIX ) 40 MG tablet Take 40 mg by mouth daily.    [DISCONTINUED] clopidogrel  (PLAVIX ) 75 MG tablet Take 75 mg by mouth daily. (Patient not taking: Reported on 12/30/2023) 02/03/2024: was d/c by cardiology per pt   No facility-administered medications prior to visit.      Objective    BP 139/75 (BP Location: Left Arm, Patient Position: Sitting, Cuff Size: Normal)   Pulse 66   Resp 16   Wt 245 lb 4.8 oz  (111.3 kg)   SpO2 97%   BMI 36.22 kg/m    Physical Exam   General appearance: Mildly obese male, cooperative and in no acute distress Head: Normocephalic, without obvious abnormality, atraumatic Respiratory: Respirations even and unlabored, normal respiratory rate Extremities: All extremities are intact.  Skin: Skin color, texture, turgor normal. No rashes seen  Psych: Appropriate mood and affect. Neurologic: Mental status: Alert, oriented to person, place, and time, thought content appropriate.   Results for orders placed or performed in visit on 02/03/24  POCT glycosylated hemoglobin (Hb A1C)  Result Value Ref Range   Hemoglobin A1C 6.6 (A) 4.0 - 5.6 %   Est. average glucose Bld gHb Est-mCnc 143     Assessment & Plan     1. Diabetes mellitus without complication (HCC) (Primary) Newly diagnosed. Counseled on dietary changes and regular exercise.   - Ambulatory referral to diabetic education   Return in about 4 months (around 06/04/2024) for Diabetes.         Jeralene Mom, MD  [facility] [phone] (phone) [phone] (fax)  [facility]

## 2024-02-03 NOTE — Patient Instructions (Signed)
 Marland Kitchen  Please review the attached list of medications and notify my office if there are any errors.   . Please bring all of your medications to every appointment so we can make sure that our medication list is the same as yours.

## 2024-03-23 NOTE — Progress Notes (Signed)
 Diabetes Self-Management Education  Visit Type: First/Initial  Appt. Start Time: 0810 Appt. End Time: 0915  03/30/2024  Mr. Patrick Barrera, identified by name and date of birth, is a 68 y.o. male with a diagnosis of Diabetes: Type 2.   ASSESSMENT  Patient is here today alone.  Patient would like to learn more about eating healthier to lower his A1C%. Pt reports he is retired. Patient lives with his wife and wife does the shopping and cooking.  Pt reports eating out 3-4 times weekly.  Pt reports making the following changes including selecting smaller portions. Pt desires to increase his physical activity and states he experiences hip pain; RD encouraged Pt to share this finding with PCP. Pt reports he is attempting to use splenda in his tea and coffee. Pt reports he avoids regular soda and states daily intake of juice.  All Pt's questions were answered during this encounter.    History includes:   Past Medical History:  Diagnosis Date   Abdominal bruit    Aortic aneurysm (HCC)    Arthritis    Atherosclerosis of aorta (HCC)    Barrett esophagus    Cholesterol blood lowered    Complex tear of lateral meniscus of right knee as current injury 03/20/2019   Complex tear of medial meniscus of right knee as current injury 03/20/2019   Coronary artery disease    Cutaneous lipodystrophy    Dental bridge present    permanent.  Bottom front   Diverticulosis of colon    DOE (dyspnea on exertion)    ED (erectile dysfunction) of organic origin    Elevated lipids    GERD (gastroesophageal reflux disease)    History of adenomatous polyp of colon    History of Barrett's esophagus    History of rectal bleeding    Hypertension    Low testosterone  in male    Lung nodule    Myocardial infarction (HCC) 08/2006   With occlusion of RCA, stents x2   Peripheral artery disease (HCC)    PONV (postoperative nausea and vomiting)     Medications include:   Current Outpatient Medications:    aspirin  EC 81  MG tablet, Take 1 tablet (81 mg total) by mouth daily. Swallow whole., Disp: 90 tablet, Rfl: 3   ezetimibe  (ZETIA ) 10 MG tablet, TAKE 1 TABLET EVERY DAY, Disp: 90 tablet, Rfl: 3   lisinopril  (ZESTRIL ) 10 MG tablet, TAKE 1 TABLET EVERY DAY, Disp: 90 tablet, Rfl: 3   metoprolol  succinate (TOPROL -XL) 25 MG 24 hr tablet, Take 1 tablet (25 mg total) by mouth daily., Disp: 90 tablet, Rfl: 3   nitroGLYCERIN  (NITROSTAT ) 0.4 MG SL tablet, DISSOLVE 1 TABLET UNDER TONGUE EVERY 5 MIN AS NEED FOR CHEST PAIN. CALL 911 IF NO RELIEF AFTER 3DOSE, Disp: 25 tablet, Rfl: 0   pantoprazole  (PROTONIX ) 40 MG tablet, Take 40 mg by mouth daily., Disp: , Rfl:    acetaminophen  (TYLENOL ) 500 MG tablet, Take 1,000 mg by mouth every 6 (six) hours as needed for moderate pain or headache. , Disp: , Rfl:    atorvastatin  (LIPITOR ) 80 MG tablet, TAKE 1 TABLET EVERY DAY, Disp: 90 tablet, Rfl: 3   diphenhydrAMINE  (BENADRYL ) 50 MG tablet, Take 50 mg by mouth at bedtime., Disp: , Rfl:    isosorbide  mononitrate (IMDUR ) 30 MG 24 hr tablet, Take 1 tablet (30 mg total) by mouth daily., Disp: 90 tablet, Rfl: 3  Labs noted:   Lab Results  Component Value Date   HGBA1C 6.6 (  A) 02/03/2024   Lab Results  Component Value Date   CHOL 117 11/05/2023   HDL 29 (L) 11/05/2023   LDLCALC 56 11/05/2023   TRIG 191 (H) 11/05/2023   CHOLHDL 4.0 11/05/2023   BP Readings from Last 3 Encounters:  02/03/24 139/75  01/06/24 (!) 139/96  11/12/23 118/72   There were no vitals taken for this visit. There is no height or weight on file to calculate BMI.   Diabetes Self-Management Education - 03/30/24 0820       Visit Information   Visit Type First/Initial      Initial Visit   Diabetes Type Type 2    Date Diagnosed 2025    Are you currently following a meal plan? No    Are you taking your medications as prescribed? Not on Medications      Health Coping   How would you rate your overall health? Good      Psychosocial Assessment   Patient  Belief/Attitude about Diabetes Motivated to manage diabetes    What is the hardest part about your diabetes right now, causing you the most concern, or is the most worrisome to you about your diabetes?   Making healty food and beverage choices;Being active    Self-care barriers None    Self-management support Doctor's office    Other persons present Patient    Patient Concerns Nutrition/Meal planning    Special Needs None    Preferred Learning Style No preference indicated    How often do you need to have someone help you when you read instructions, pamphlets, or other written materials from your doctor or pharmacy? 1 - Never    What is the last grade level you completed in school? 12th      Pre-Education Assessment   Patient understands the diabetes disease and treatment process. Needs Instruction    Patient understands incorporating nutritional management into lifestyle. Needs Instruction    Patient undertands incorporating physical activity into lifestyle. Needs Instruction    Patient understands using medications safely. Needs Instruction    Patient understands monitoring blood glucose, interpreting and using results Needs Instruction    Patient understands prevention, detection, and treatment of acute complications. Needs Instruction    Patient understands prevention, detection, and treatment of chronic complications. Needs Instruction    Patient understands how to develop strategies to address psychosocial issues. Needs Instruction    Patient understands how to develop strategies to promote health/change behavior. Needs Instruction      Complications   Last HgB A1C per patient/outside source 6.6 %    How often do you check your blood sugar? 0 times/day (not testing)    Have you had a dilated eye exam in the past 12 months? Yes    Have you had a dental exam in the past 12 months? Yes    Are you checking your feet? No      Dietary Intake   Breakfast bacon, egg, cheese, on 2 slices  of white ot 1 slices white bread, eggs, ~2/3 cup grits, bacon, coffee with SF french vanilla creamer, ~ 8 ounces orange juice    Lunch 2 slices of white bread with [place] and cheese or pimento cheese or burger on bun with mayo, cheese, tomato, home made chips, sweet tea    Dinner 6inch white bread, steak, cheese, onions, lettuce, diet mountain dew or stew beef, potatoes, onions, salad with dressing, diet or water    Beverage(s) coffee with SF french vanilla creamer, ~ 8  ounces orange juice      Activity / Exercise   Activity / Exercise Type ADL's      Patient Education   Previous Diabetes Education No    Disease Pathophysiology Definition of diabetes, type 1 and 2, and the diagnosis of diabetes    Healthy Eating Role of diet in the treatment of diabetes and the relationship between the three main macronutrients and blood glucose level;Food label reading, portion sizes and measuring food.;Plate Method;Carbohydrate counting    Being Active Role of exercise on diabetes management, blood pressure control and cardiac health.    Medications Other (comment)   n/a   Monitoring Daily foot exams;Interpreting lab values - A1C, lipid, urine microalbumina.    Chronic complications Relationship between chronic complications and blood glucose control    Diabetes Stress and Support Role of stress on diabetes    Lifestyle and Health Coping Lifestyle issues that need to be addressed for better diabetes care   n/a     Individualized Goals (developed by patient)   Nutrition Follow meal plan discussed    Physical Activity 15 minutes per day;30 minutes per day;Exercise 1-2 times per week    Medications Not Applicable    Monitoring  Not Applicable    Problem Solving Eating Pattern;Addressing barriers to behavior change    Reducing Risk do foot checks daily    Health Coping Ask for help with psychological, social, or emotional issues      Post-Education Assessment   Patient understands the diabetes disease and  treatment process. Needs Review    Patient understands incorporating nutritional management into lifestyle. Needs Review    Patient undertands incorporating physical activity into lifestyle. Needs Review    Patient understands using medications safely. Needs Instruction    Patient understands monitoring blood glucose, interpreting and using results Needs Instruction    Patient understands prevention, detection, and treatment of acute complications. Needs Review    Patient understands prevention, detection, and treatment of chronic complications. Needs Review    Patient understands how to develop strategies to address psychosocial issues. Needs Review    Patient understands how to develop strategies to promote health/change behavior. Needs Review      Outcomes   Expected Outcomes Demonstrated interest in learning. Expect positive outcomes    Future DMSE 3-4 months          Individualized Plan for Diabetes Self-Management Training:   Learning Objective:  Patient will have a greater understanding of diabetes self-management. Patient education plan is to attend individual and/or group sessions per assessed needs and concerns.   Plan:   Patient Instructions  1- Increase physical activity as tolerated with MD consent   2-Decrease intake of sugary sweetened beverages  Expected Outcomes:  Demonstrated interest in learning. Expect positive outcomes  Education material provided: ADA - How to Thrive: A Guide for Your Journey with Diabetes, My Plate, Snack sheet, and Diabetes Resources  If problems or questions, patient to contact team via:  Phone  Future DSME appointment: 3-4 months

## 2024-03-30 ENCOUNTER — Encounter: Attending: Family Medicine | Admitting: Dietician

## 2024-03-30 DIAGNOSIS — Z713 Dietary counseling and surveillance: Secondary | ICD-10-CM | POA: Diagnosis not present

## 2024-03-30 DIAGNOSIS — E119 Type 2 diabetes mellitus without complications: Secondary | ICD-10-CM | POA: Diagnosis present

## 2024-03-30 NOTE — Patient Instructions (Addendum)
 1- Increase physical activity as tolerated with MD consent   2-Decrease intake of sugary sweetened beverages

## 2024-04-06 ENCOUNTER — Other Ambulatory Visit: Payer: Self-pay | Admitting: Acute Care

## 2024-04-06 DIAGNOSIS — Z87891 Personal history of nicotine dependence: Secondary | ICD-10-CM

## 2024-04-06 DIAGNOSIS — Z122 Encounter for screening for malignant neoplasm of respiratory organs: Secondary | ICD-10-CM

## 2024-04-20 ENCOUNTER — Ambulatory Visit
Admission: RE | Admit: 2024-04-20 | Discharge: 2024-04-20 | Disposition: A | Discharge status: Home / Self Care | Source: Ambulatory Visit | Attending: Acute Care | Admitting: Acute Care

## 2024-04-20 DIAGNOSIS — Z87891 Personal history of nicotine dependence: Secondary | ICD-10-CM | POA: Insufficient documentation

## 2024-04-20 DIAGNOSIS — Z122 Encounter for screening for malignant neoplasm of respiratory organs: Secondary | ICD-10-CM | POA: Insufficient documentation

## 2024-04-28 ENCOUNTER — Other Ambulatory Visit: Payer: Self-pay | Admitting: Acute Care

## 2024-04-28 DIAGNOSIS — Z122 Encounter for screening for malignant neoplasm of respiratory organs: Secondary | ICD-10-CM

## 2024-04-28 DIAGNOSIS — Z87891 Personal history of nicotine dependence: Secondary | ICD-10-CM

## 2024-05-06 ENCOUNTER — Other Ambulatory Visit: Payer: Self-pay | Admitting: Surgery

## 2024-05-06 DIAGNOSIS — M19012 Primary osteoarthritis, left shoulder: Secondary | ICD-10-CM

## 2024-05-06 DIAGNOSIS — M7582 Other shoulder lesions, left shoulder: Secondary | ICD-10-CM

## 2024-05-08 ENCOUNTER — Ambulatory Visit
Admission: RE | Admit: 2024-05-08 | Discharge: 2024-05-08 | Disposition: A | Discharge status: Home / Self Care | Source: Ambulatory Visit | Attending: Surgery | Admitting: Surgery

## 2024-05-08 DIAGNOSIS — M19012 Primary osteoarthritis, left shoulder: Secondary | ICD-10-CM | POA: Insufficient documentation

## 2024-05-08 DIAGNOSIS — M7582 Other shoulder lesions, left shoulder: Secondary | ICD-10-CM | POA: Insufficient documentation

## 2024-05-19 ENCOUNTER — Other Ambulatory Visit: Payer: Self-pay | Admitting: Surgery

## 2024-05-25 ENCOUNTER — Telehealth: Payer: Self-pay

## 2024-05-25 NOTE — Telephone Encounter (Signed)
  Patient Consent for Virtual Visit        Patrick Barrera has provided verbal consent on 05/25/2024 for a virtual visit (video or telephone).   CONSENT FOR VIRTUAL VISIT FOR:  Patrick Barrera  By participating in this virtual visit I agree to the following:  I hereby voluntarily request, consent and authorize [facility] and its employed or contracted physicians, physician assistants, nurse practitioners or other licensed health care professionals (the Practitioner), to provide me with telemedicine health care services (the "Services) as deemed necessary by the treating Practitioner. I acknowledge and consent to receive the Services by the Practitioner via telemedicine. I understand that the telemedicine visit will involve communicating with the Practitioner through live audiovisual communication technology and the disclosure of certain medical information by electronic transmission. I acknowledge that I have been given the opportunity to request an in-person assessment or other available alternative prior to the telemedicine visit and am voluntarily participating in the telemedicine visit.  I understand that I have the right to withhold or withdraw my consent to the use of telemedicine in the course of my care at any time, without affecting my right to future care or treatment, and that the Practitioner or I may terminate the telemedicine visit at any time. I understand that I have the right to inspect all information obtained and/or recorded in the course of the telemedicine visit and may receive copies of available information for a reasonable fee.  I understand that some of the potential risks of receiving the Services via telemedicine include:  Delay or interruption in medical evaluation due to technological equipment failure or disruption; Information transmitted may not be sufficient (e.g. poor resolution of images) to allow for appropriate medical decision making by the Practitioner;  and/or  In rare instances, security protocols could fail, causing a breach of personal health information.  Furthermore, I acknowledge that it is my responsibility to provide information about my medical history, conditions and care that is complete and accurate to the best of my ability. I acknowledge that Practitioner's advice, recommendations, and/or decision may be based on factors not within their control, such as incomplete or inaccurate data provided by me or distortions of diagnostic images or specimens that may result from electronic transmissions. I understand that the practice of medicine is not an exact science and that Practitioner makes no warranties or guarantees regarding treatment outcomes. I acknowledge that a copy of this consent can be made available to me via my patient portal [facility] MyChart), or I can request a printed copy by calling the office of [facility].    I understand that my insurance will be billed for this visit.   I have read or had this consent read to me. I understand the contents of this consent, which adequately explains the benefits and risks of the Services being provided via telemedicine.  I have been provided ample opportunity to ask questions regarding this consent and the Services and have had my questions answered to my satisfaction. I give my informed consent for the services to be provided through the use of telemedicine in my medical care

## 2024-05-25 NOTE — Telephone Encounter (Signed)
   Pre-operative Risk Assessment    Patient Name: Patrick Barrera  DOB: [birth year] MRN: [id number]   Date of last office visit: 11/12/23 EVALENE LUNGER, MD Date of next office visit: NONE   Request for Surgical Clearance    Procedure:  ARTHROPLASTY, SHOULDER, TOTAL; BICEPS TENODESIS  Date of Surgery:  Clearance 06/02/24                                Surgeon:  Norleen Maltos, MD  Surgeon's Group or Practice Name:  [facility] Phone number:  [phone] Fax number:  [phone]   Type of Clearance Requested:   - Medical  - Pharmacy:  Hold Aspirin  (PER REQUEST: patient will be continuing his ASA throughout his perioperative course)   Type of Anesthesia:  General    Additional requests/questions:    Signed, Lucie DELENA Ku   05/25/2024, 10:21 AM      Elnor Dorise BRAVO, NP  P Cv Div Preop Callback Request for pre-operative cardiac clearance:   1. What type of surgery is being performed? ARTHROPLASTY, SHOULDER, TOTAL; BICEPS TENODESIS  2. When is this surgery scheduled? 06/02/2024   3. Type of clearance being requested (medical, pharmacy, both)? MEDICAL   4. Are there any medications that need to be held prior to surgery? N/A - patient will be continuing his ASA throughout his perioperative course  5. Practice name and name of physician performing surgery? Performing surgeon: Dr. Norleen Maltos, MD Requesting clearance: Dorise Elnor, FNP-C     6. Anesthesia type (none, local, MAC, general)? GENERAL  7. What is the office phone and fax number?   Phone: [phone] Fax: [phone]  ATTENTION: Unable to create telephone message as per your standard workflow. Directed by [facility] providers to send requests for cardiac clearance to this pool for appropriate distribution to provider covering pre-operative clearances.  Dorise Elnor, MSN, APRN, FNP-C, CEN [facility] Peri-operative Services Nurse Practitioner Phone: [phone] 05/25/24 10:11 AM

## 2024-05-25 NOTE — Telephone Encounter (Signed)
 Preop tele appt now scheduled, med rec and consent done.

## 2024-05-25 NOTE — Telephone Encounter (Signed)
-----   Message from Dorise CHARLENA Pereyra sent at 05/25/2024 10:11 AM EDT ----- Regarding: Request for pre-operative cardiac clearance Request for pre-operative cardiac clearance:    1. What type of surgery is being performed?  ARTHROPLASTY, SHOULDER, TOTAL; BICEPS TENODESIS   2. When is this surgery scheduled?  06/02/2024   3. Type of clearance being requested (medical, pharmacy, both)?  MEDICAL    4. Are there any medications that need to be held prior to surgery?  N/A - patient will be continuing his ASA throughout his perioperative course   5. Practice name and name of physician performing surgery?  Performing surgeon: Dr. Norleen Maltos, MD  Requesting clearance: Dorise Pereyra, FNP-C     6. Anesthesia type (none, local, MAC, general)?  GENERAL   7. What is the office phone and fax number?   Phone: [phone]  Fax: [phone]   ATTENTION: Unable to create telephone message as per your standard workflow. Directed by [facility] providers to send requests for cardiac clearance to this pool for appropriate distribution to provider covering pre-operative clearances.   Dorise Pereyra, MSN, APRN, FNP-C, CEN [facility]  Peri-operative Services Nurse Practitioner Phone: [phone] 05/25/24 10:11 AM

## 2024-05-25 NOTE — Telephone Encounter (Signed)
   Name: Patrick Barrera  DOB: 01/04/1956  MRN: [id number]  Primary Cardiologist: Evalene Lunger, MD   Preoperative team, please contact this patient and set up a phone call appointment for further preoperative risk assessment. Please obtain consent and complete medication review. Thank you for your help.  I confirm that guidance regarding antiplatelet and oral anticoagulation therapy has been completed and, if necessary, noted below.  Per request patient will be continuing his ASA throughout the perioperative course  I also confirmed the patient resides in the state of [place] . As per [facility] Medical Board telemedicine laws, the patient must reside in the state in which the provider is licensed.   Lum LITTIE Louis, NP 05/25/2024, 4:43 PM [facility]

## 2024-05-26 ENCOUNTER — Encounter
Admission: RE | Admit: 2024-05-26 | Discharge: 2024-05-26 | Disposition: A | Discharge status: Home / Self Care | Source: Ambulatory Visit | Attending: Surgery | Admitting: Surgery

## 2024-05-26 ENCOUNTER — Other Ambulatory Visit: Payer: Self-pay

## 2024-05-26 VITALS — BP 146/77 | HR 57 | Temp 97.7°F | Resp 17 | Ht 69.0 in | Wt 248.1 lb

## 2024-05-26 DIAGNOSIS — Z01818 Encounter for other preprocedural examination: Secondary | ICD-10-CM | POA: Insufficient documentation

## 2024-05-26 LAB — CBC WITH DIFFERENTIAL/PLATELET
Abs Immature Granulocytes: 0.03 K/uL (ref 0.00–0.07)
Basophils Absolute: 0.1 K/uL (ref 0.0–0.1)
Basophils Relative: 1 %
Eosinophils Absolute: 0.3 K/uL (ref 0.0–0.5)
Eosinophils Relative: 6 %
HCT: 42 % (ref 39.0–52.0)
Hemoglobin: 14.2 g/dL (ref 13.0–17.0)
Immature Granulocytes: 1 %
Lymphocytes Relative: 32 %
Lymphs Abs: 1.9 K/uL (ref 0.7–4.0)
MCH: 32.8 pg (ref 26.0–34.0)
MCHC: 33.8 g/dL (ref 30.0–36.0)
MCV: 97 fL (ref 80.0–100.0)
Monocytes Absolute: 0.7 K/uL (ref 0.1–1.0)
Monocytes Relative: 13 %
Neutro Abs: 2.8 K/uL (ref 1.7–7.7)
Neutrophils Relative %: 47 %
Platelets: 240 K/uL (ref 150–400)
RBC: 4.33 MIL/uL (ref 4.22–5.81)
RDW: 12.6 % (ref 11.5–15.5)
WBC: 5.8 K/uL (ref 4.0–10.5)
nRBC: 0 % (ref 0.0–0.2)

## 2024-05-26 LAB — COMPREHENSIVE METABOLIC PANEL WITH GFR
ALT: 38 U/L (ref 0–44)
AST: 34 U/L (ref 15–41)
Albumin: 3.4 g/dL — ABNORMAL LOW (ref 3.5–5.0)
Alkaline Phosphatase: 67 U/L (ref 38–126)
Anion gap: 6 (ref 5–15)
BUN: 23 mg/dL (ref 8–23)
CO2: 27 mmol/L (ref 22–32)
Calcium: 9.1 mg/dL (ref 8.9–10.3)
Chloride: 106 mmol/L (ref 98–111)
Creatinine, Ser: 1.15 mg/dL (ref 0.61–1.24)
GFR, Estimated: >60 mL/min (ref >60)
Glucose, Bld: 139 mg/dL — ABNORMAL HIGH (ref 70–99)
Potassium: 4.5 mmol/L (ref 3.5–5.1)
Sodium: 139 mmol/L (ref 135–145)
Total Bilirubin: 0.4 mg/dL (ref 0.0–1.2)
Total Protein: 7.1 g/dL (ref 6.5–8.1)

## 2024-05-26 LAB — URINALYSIS, ROUTINE W REFLEX MICROSCOPIC
Bilirubin Urine: NEGATIVE
Glucose, UA: 50 mg/dL — AB
Hgb urine dipstick: NEGATIVE
Ketones, ur: NEGATIVE mg/dL
Leukocytes,Ua: NEGATIVE
Nitrite: NEGATIVE
Protein, ur: NEGATIVE mg/dL
Specific Gravity, Urine: 1.018 (ref 1.005–1.030)
pH: 6 (ref 5.0–8.0)

## 2024-05-26 LAB — SURGICAL PCR SCREEN
MRSA, PCR: NEGATIVE
Staphylococcus aureus: NEGATIVE

## 2024-05-26 NOTE — Patient Instructions (Addendum)
 Your procedure is scheduled on: TUESDAY 06/02/24 Report to the Registration Desk on the 1st floor of the [facility]. To find out your arrival time, please call [phone] between 1PM - 3PM on: MONDAY 06/01/24 If your arrival time is 6:00 am, do not arrive before that time as the [facility] entrance doors do not open until 6:00 am.  REMEMBER: Instructions that are not followed completely may result in serious medical risk, up to and including death; or upon the discretion of your surgeon and anesthesiologist your surgery may need to be rescheduled.  Do not eat food after midnight the night before surgery.  No gum chewing or hard candies.  You may however, drink CLEAR liquids up to 2 hours before you are scheduled to arrive for your surgery. Do not drink anything within 2 hours of your scheduled arrival time.  Clear liquids include: - water  - apple juice without pulp - gatorade (not RED colors) - black coffee or tea (Do NOT add milk or creamers to the coffee or tea) Do NOT drink anything that is not on this list.  In addition, your doctor has ordered for you to drink the provided:  Ensure Pre-Surgery Clear Carbohydrate Drink  Drinking this carbohydrate drink up to two hours before surgery helps to reduce insulin resistance and improve patient outcomes. Please complete drinking 2 hours before scheduled arrival time.  One week prior to surgery: Stop Anti-inflammatories (NSAIDS) such as Advil, Aleve, Ibuprofen, Motrin, Naproxen, Naprosyn and Aspirin  based products such as Excedrin, Goody's Powder, BC Powder. Stop ANY OVER THE COUNTER supplements until after surgery.  You may however, continue to take Tylenol  if needed for pain up until the day of surgery.  Continue taking all of your other prescription medications up until the day of surgery.  ON THE DAY OF SURGERY ONLY TAKE THESE MEDICATIONS WITH SIPS OF WATER:  acetaminophen  (TYLENOL )  atorvastatin  (LIPITOR )  ezetimibe  (ZETIA )   metoprolol  succinate (TOPROL -XL) * pantoprazole  (PROTONIX ) *  Use inhalers on the day of surgery and bring to the hospital.  No Alcohol for 24 hours before or after surgery.  No Smoking including e-cigarettes for 24 hours before surgery.  No chewable tobacco products for at least 6 hours before surgery.  No nicotine patches on the day of surgery.  Do not use any recreational drugs for at least a week (preferably 2 weeks) before your surgery.  Please be advised that the combination of cocaine and anesthesia may have negative outcomes, up to and including death. If you test positive for cocaine, your surgery will be cancelled.  On the morning of surgery brush your teeth with toothpaste and water, you may rinse your mouth with mouthwash if you wish. Do not swallow any toothpaste or mouthwash.  Use CHG Soap or wipes as directed on instruction sheet.  Do not wear jewelry, make-up, hairpins, clips or nail polish.  For welded (permanent) jewelry: bracelets, anklets, waist bands, etc.  Please have this removed prior to surgery.  If it is not removed, there is a chance that hospital personnel will need to cut it off on the day of surgery.  Do not wear lotions, powders, or perfumes.   Do not shave body hair from the neck down 48 hours before surgery.  Contact lenses, hearing aids and dentures may not be worn into surgery.  Do not bring valuables to the hospital. [facility] is not responsible for any missing/lost belongings or valuables.   Total Shoulder Arthroplasty:  use Benzoyl  Peroxide 5% Gel as directed on instruction sheet.  Bring your C-PAP to the hospital in case you may have to spend the night.   Notify your doctor if there is any change in your medical condition (cold, fever, infection).  Wear comfortable clothing (specific to your surgery type) to the hospital.  After surgery, you can help prevent lung complications by doing breathing exercises.  Take deep breaths and  cough every 1-2 hours. Your doctor may order a device called an Incentive Spirometer to help you take deep breaths. When coughing or sneezing, hold a pillow firmly against your incision with both hands. This is called "splinting." Doing this helps protect your incision. It also decreases belly discomfort.  If you are being admitted to the hospital overnight, leave your suitcase in the car. After surgery it may be brought to your room.  In case of increased patient census, it may be necessary for you, the patient, to continue your postoperative care in the Same Day Surgery department.  If you are being discharged the day of surgery, you will not be allowed to drive home. You will need a responsible individual to drive you home and stay with you for 24 hours after surgery.   If you are taking public transportation, you will need to have a responsible individual with you.  Please call the Pre-admissions Testing Dept. at [phone] if you have any questions about these instructions.  Surgery Visitation Policy:  Patients having surgery or a procedure may have two visitors.  Children under the age of 59 must have an adult with them who is not the patient.  Inpatient Visitation:    Visiting hours are 7 a.m. to 8 p.m. Up to four visitors are allowed at one time in a patient room. The visitors may rotate out with other people during the day.  One visitor age 20 or older may stay with the patient overnight and must be in the room by 8 p.m.  Merchandiser, retail to address health-related social needs:  [email or web address]    Pre-operative 5 CHG Bath Instructions   You can play a key role in reducing the risk of infection after surgery. Your skin needs to be as free of germs as possible. You can reduce the number of germs on your skin by washing with CHG (chlorhexidine  gluconate) soap before surgery. CHG is an antiseptic soap that kills germs and continues to kill germs  even after washing.   DO NOT use if you have an allergy to chlorhexidine /CHG or antibacterial soaps. If your skin becomes reddened or irritated, stop using the CHG and notify one of our RNs at [phone].   Please shower with the CHG soap starting 4 days before surgery using the following schedule:     Please keep in mind the following:  DO NOT shave, including legs and underarms, starting the day of your first shower.   You may shave your face at any point before/day of surgery.  Place clean sheets on your bed the day you start using CHG soap. Use a clean washcloth (not used since being washed) for each shower. DO NOT sleep with pets once you start using the CHG.   CHG Shower Instructions:  If you choose to wash your hair and private area, wash first with your normal shampoo/soap.  After you use shampoo/soap, rinse your hair and body thoroughly to remove shampoo/soap residue.  Turn the water OFF and apply about 3 tablespoons (45 ml) of CHG soap to a  CLEAN washcloth.  Apply CHG soap ONLY FROM YOUR NECK DOWN TO YOUR TOES (washing for 3-5 minutes)  DO NOT use CHG soap on face, private areas, open wounds, or sores.  Pay special attention to the area where your surgery is being performed.  If you are having back surgery, having someone wash your back for you may be helpful. Wait 2 minutes after CHG soap is applied, then you may rinse off the CHG soap.  Pat dry with a clean towel  Put on clean clothes/pajamas   If you choose to wear lotion, please use ONLY the CHG-compatible lotions on the back of this paper.     Additional instructions for the day of surgery: DO NOT APPLY any lotions, deodorants, cologne, or perfumes.   Put on clean/comfortable clothes.  Brush your teeth.  Ask your nurse before applying any prescription medications to the skin.      CHG Compatible Lotions   Aveeno Moisturizing lotion  Cetaphil Moisturizing Cream  Cetaphil Moisturizing Lotion  Clairol Herbal  Essence Moisturizing Lotion, Dry Skin  Clairol Herbal Essence Moisturizing Lotion, Extra Dry Skin  Clairol Herbal Essence Moisturizing Lotion, Normal Skin  Curel Age Defying Therapeutic Moisturizing Lotion with Alpha Hydroxy  Curel Extreme Care Body Lotion  Curel Soothing Hands Moisturizing Hand Lotion  Curel Therapeutic Moisturizing Cream, Fragrance-Free  Curel Therapeutic Moisturizing Lotion, Fragrance-Free  Curel Therapeutic Moisturizing Lotion, Original Formula  Eucerin Daily Replenishing Lotion  Eucerin Dry Skin Therapy Plus Alpha Hydroxy Crme  Eucerin Dry Skin Therapy Plus Alpha Hydroxy Lotion  Eucerin Original Crme  Eucerin Original Lotion  Eucerin Plus Crme Eucerin Plus Lotion  Eucerin TriLipid Replenishing Lotion  Keri Anti-Bacterial Hand Lotion  Keri Deep Conditioning Original Lotion Dry Skin Formula Softly Scented  Keri Deep Conditioning Original Lotion, Fragrance Free Sensitive Skin Formula  Keri Lotion Fast Absorbing Fragrance Free Sensitive Skin Formula  Keri Lotion Fast Absorbing Softly Scented Dry Skin Formula  Keri Original Lotion  Keri Skin Renewal Lotion Keri Silky Smooth Lotion  Keri Silky Smooth Sensitive Skin Lotion  Nivea Body Creamy Conditioning Oil  Nivea Body Extra Enriched Lotion  Nivea Body Original Lotion  Nivea Body Sheer Moisturizing Lotion Nivea Crme  Nivea Skin Firming Lotion  NutraDerm 30 Skin Lotion  NutraDerm Skin Lotion  NutraDerm Therapeutic Skin Cream  NutraDerm Therapeutic Skin Lotion  ProShield Protective Hand Cream  Provon moisturizing lotion  Preparing for Total Shoulder Arthroplasty  Before surgery, you can play an important role by reducing the number of germs on your skin by using the following products:   Benzoyl Peroxide Gel  o Reduces the number of germs present on the skin  o Applied twice a day to shoulder area starting two days before surgery  Chlorhexidine  Gluconate (CHG) Soap  o An antiseptic cleaner that kills  germs and bonds with the skin to continue killing germs even after washing  o Used for showering the night before surgery and morning of surgery  BENZOYL PEROXIDE 5% GEL                                 Please do not use if you have an allergy to benzoyl peroxide. If your skin becomes reddened/irritated stop using the benzoyl peroxide.  Starting two days before surgery, apply as follows:  1. Apply benzoyl peroxide in the morning and at night. Apply after taking a shower. If you are not taking a shower, clean entire  shoulder front, back, and side along with the armpit with a clean wet washcloth.  2. Place a quarter-sized dollop on your shoulder and rub in thoroughly, making sure to cover the front, back, and side of your shoulder, along with the armpit.  2 days before ____ AM ____ PM 1 day before ____ AM ____ PM  3. Do this twice a day for two days. (Last application is the night before surgery, AFTER using the CHG soap).  4. Do NOT apply benzoyl peroxide gel on the day of surgery.

## 2024-05-28 ENCOUNTER — Encounter: Payer: Self-pay | Admitting: Nurse Practitioner

## 2024-05-28 ENCOUNTER — Ambulatory Visit: Attending: Cardiology | Admitting: Nurse Practitioner

## 2024-05-28 DIAGNOSIS — Z01810 Encounter for preprocedural cardiovascular examination: Secondary | ICD-10-CM | POA: Insufficient documentation

## 2024-05-28 NOTE — Progress Notes (Signed)
 Review of Systems   Musculoskeletal:  Positive for arthralgias.   All other systems reviewed and are negative.

## 2024-05-28 NOTE — Progress Notes (Signed)
 Chief Complaint: Chief Complaint  Patient presents with  . Shoulder Pain    H&P Lt TSA 06/02/24   Patrick Barrera is a 68 y.o. male who presents today for his surgical history and physical for upcoming left reverse total shoulder arthroplasty scheduled with Dr. Edie on 06/02/2024.  The patient denies any changes in his medical history since he was last evaluated.  He continues to report a 7 out of 10 pain score in the left shoulder at today's visit.  He denies any trauma or injury affecting the left shoulder since his last evaluation.  He denies any numbness or ting into the left upper extremity.  The patient is still struggling to use his left arm for day-to-day activities such as reaching behind his back to place a belt on and with his ADLs.  The patient denies any personal history of asthma or COPD.  Denies any history of DVT.  He did recently undergo a hemoglobin A1c test which was slightly elevated at 6.6 but he has not been diagnosed with diabetes in the past.  Denies any history of stroke.  He does have a history of MI in 2008, he is on aspirin .  He does have a history of stent placement.  Past Medical History: Past Medical History:  Diagnosis Date  . Abdominal bruit 09/27/2010   Qualifier: Diagnosis of  By: Claudene RN, Megan  . Acute myocardial infarction of anterolateral wall, initial episode of care (CMS/HHS-HCC)   . Allergic rhinitis   . Arthritis   . ASCVD (arteriosclerotic cardiovascular disease)   . Atherosclerosis of aorta () 09/27/2010   Qualifier: Diagnosis of  By: Claudene RN, Megan    Last Assessment & Plan:  Calcification noted on the balls of the aorta in January 2012. Continue smoking cessation and aggressive cholesterol management.  . Barrett's esophagus   . Barrett's esophagus determined by endoscopy   . Coronary atherosclerosis of native coronary artery 09/27/2010   Qualifier: Diagnosis of  By: Perla MD, Tim    Last Assessment & Plan:  Long discussion today concerning his  chest burning episodes. These are rare. Unclear if these are stable angina or secondary to COPD. If symptoms get worse, recommended we do a trial of albuterol  inhaler. Could start isosorbide  mononitrate 30 mg daily If it is more cardiac in nature, would recommend catheterization if symptoms  . Diarrhea, functional 08/21/2018  . Diverticulosis   . GERD (gastroesophageal reflux disease)    with Barrett's esophagus.  SABRA Heart attack (CMS/HHS-HCC)   . History of rectal bleeding 07/25/2017  . Hyperlipemia   . Hyperplastic colon polyp   . Hypertension   . Internal hemorrhoids   . PONV (postoperative nausea and vomiting)   . Smoking greater than 30 pack years 09/27/2010   Qualifier: Diagnosis of  By: Perla MD, Tim    Last Assessment & Plan:  Complimented him on his stopped smoking in October 2014    Past Surgical History: Past Surgical History:  Procedure Laterality Date  . APPENDECTOMY  1982  . EGD  05/01/1999   Barrett's Esophagus  . EGD  05/28/2001   Barrett's Esophagus  . HEART CATHETERIZATION  2007   WITH STENTS  . ANGIOPLASTY / STENTING FEMORAL  08/15/2006  . COLONOSCOPY  03/26/2008   Int Hemorrhoids, Divericulosis: CBF 03/2018  . EGD  03/26/2008   Barrett's Esophagus  . EGD  04/28/2014   Barrett's Esophagus: CBF 04/2017; Recall Ltr mailed 03/22/2017 (dw)  . EGD  11/08/2017   Barrett's  Esophagus: CBF 11/2020  . COLONOSCOPY  11/08/2017   Adenomatous Polyp: CBF 11/2022  (05/14/2023 Recall letter returned.awb)  . Left carpal tunnel release  09/22/2018   Dr Mardee  . Right knee arthroscopy, lateral meniscectomies, and chondroplasty  05/11/2019   Dr Mardee  . EGD  11/20/2021   Barrett's Esophagus without dysplasia/Repeat 51yrs/[facility]  . ARTHROPLASTY TOTAL KNEE Left 12/13/2021   Procedure: ROBOT ASSISTED LEFT TOTAL KNEE ARTHROPLASTY;  Surgeon: Chrys Dene Bailey, MD;  Location: [facility] OR;  Service: Orthopedics;  Laterality: Left;  . Colon @ [facility]  01/06/2024   Hyperplastic polyp/PHx  CP/Repeat 32yrs/SMR  . EGD @ [facility]  01/06/2024   Barrett's Esophagus/Repeat 16yrs/SMR  . back surgery    . neck surgery    . TONSILLECTOMY & ADENOIDECTOMY  1960s    Past Family History: Family History  Problem Relation Age of Onset  . Glaucoma Mother   . Lung disease Father   . No Known Problems Sister   . No Known Problems Brother   . No Known Problems Son   . No Known Problems Daughter   . Esophageal cancer Cousin   . Esophageal cancer Cousin        some type of esophageal cancer     Medications: Current Outpatient Medications  Medication Sig Dispense Refill  . acetaminophen  (TYLENOL ) 325 MG tablet Take 3 tablets (975 mg total) by mouth every 8 (eight) hours 90 tablet 0  . aspirin  81 MG EC tablet Take 1 tablet (81 mg total) by mouth.    . atorvastatin  (LIPITOR ) 80 MG tablet Take 1 tablet by mouth nightly.    . diphenhydrAMINE  (BENADRYL ) 50 MG capsule Take 50 mg by mouth nightly as needed for Sleep    . ezetimibe  (ZETIA ) 10 mg tablet     . lisinopril  (PRINIVIL ,ZESTRIL ) 10 MG tablet Take 10 mg by mouth once daily      . metoprolol  succinate (TOPROL -XL) 50 MG XL tablet Take 50 mg by mouth once daily    . nitroGLYcerin  (NITROSTAT ) 0.4 MG SL tablet Place 0.4 mg under the tongue every 5 (five) minutes as needed for Chest pain (tab 1 under tongue every 5 minutes as needed for chest pain, call 911 after 3 doses if no improvement)      . pantoprazole  (PROTONIX ) 40 MG DR tablet Take 1 tablet (40 mg total) by mouth once daily Take 15-20 mins before meal 90 tablet 3   No current facility-administered medications for this visit.    Allergies: Allergies  Allergen Reactions  . Codeine Nausea  . Ezetimibe -Simvastatin Muscle Pain    Leg cramps     Review of Systems:  A comprehensive 14 point ROS was performed, reviewed by me today, and the pertinent orthopaedic findings are documented in the HPI.   Exam: BP (!) 140/78   Ht 177.8 cm (5' 10)   Wt (!) 112 kg (247 lb)   BMI 35.44  kg/m  General/Constitutional: The patient appears to be well-nourished, well-developed, and in no acute distress. Neuro/Psych: Normal mood and affect, oriented to person, place and time. Eyes: Non-icteric.  Pupils are equal, round, and reactive to light, and exhibit synchronous movement. ENT: Unremarkable. Lymphatic: No palpable adenopathy. Respiratory: Lungs clear to auscultation, Normal chest excursion, No wheezes, and Non-labored breathing Cardiovascular: Regular rate and rhythm.  No murmurs. and No edema, swelling or tenderness, except as noted in detailed exam. Integumentary: No impressive skin lesions present, except as noted in detailed exam. Musculoskeletal: Unremarkable, except as noted in  detailed exam.  Left shoulder exam: SKIN: normal SWELLING: none WARMTH: none LYMPH NODES: no adenopathy palpable CREPITUS: none TENDERNESS: Mildly tender over anterolateral shoulder ROM (active):      Forward flexion: 110 degrees    Abduction: 80 degrees    Internal rotation: Patient declines this motion due to pain ROM (passive):      Forward flexion: 160 degrees    Abduction: 130 degrees       ER/IR at 90 abd: Patient declines these motions due to pain   The patient describes moderate to severe pain with internal rotation, abduction, and internal and external rotation at 90 degrees of abduction.   STRENGTH:   Forward flexion: 4-4+/5                         Abduction: 4/5                         External rotation: 4/5                         Internal rotation: 4-4+/5                         Pain with RC testing: Moderate pain with resisted external rotation and abduction.   STABILITY: Normal   SPECIAL TESTS:       Vonzell' test: positive, moderate                                     Speed's test: Mildly positive                                     Capsulitis - pain w/ passive ER: no                                     Crossed arm test: Minimally positive                                      Crank: Not evaluated                                     Anterior apprehension: Negative                                     Posterior apprehension: Not evaluated   He is neurovascularly intact to the left upper extremity.  Imaging: MRI OF THE LEFT SHOULDER WITHOUT CONTRAST obtained on 05/08/24  TECHNIQUE:  Multiplanar, multisequence MR imaging of the shoulder was performed.  No intravenous contrast was administered.   COMPARISON:  None Available.   FINDINGS:  Rotator cuff: Severe supraspinatus tendinosis with a small shallow  partial-thickness bursal surface tear. Moderate infraspinatus  tendinosis. Teres minor tendon is intact. Subscapularis tendon is  intact.   Muscles: No muscle atrophy or edema. No intramuscular fluid  collection or hematoma.   Biceps Long Head: Moderate tendinosis of the intra-articular  portion  of the long head of the biceps tendon.   Acromioclavicular Joint: Moderate arthropathy of the  acromioclavicular joint. Small amount of subacromial/subdeltoid  bursal fluid.   Glenohumeral Joint: No joint effusion. Full-thickness cartilage loss  of the glenoid. High-grade partial-thickness cartilage loss of the  superomedial humeral head.   Labrum: Superior labral degeneration.   Bones: No fracture or dislocation. No aggressive osseous lesion.   Other: No fluid collection or hematoma.   IMPRESSION:  1. Severe supraspinatus tendinosis with a small shallow  partial-thickness bursal surface tear.  2. Moderate infraspinatus tendinosis.  3. Moderate tendinosis of the intra-articular portion of the long  head of the biceps tendon.  4. Moderate-severe osteoarthritis of the glenohumeral joint.  5. Mild subacromial/subdeltoid bursitis.   Impression: Primary osteoarthritis of left shoulder [M19.012] Primary osteoarthritis of left shoulder  (primary encounter diagnosis) Rotator cuff tendinitis, left Chronic left shoulder pain  Plan:  1.  Treatment  options were discussed today with the patient. 2.  The patient is scheduled for a left reverse total shoulder arthroplasty with Dr. Edie on 06/02/2024. 3.  The patient was instructed on the risk and benefits of surgical intervention and wishes to proceed at this time.  Due to his history of MI and stent placement he will continue on his aspirin , 81 mg daily. 4.   This document will serve as a surgical history and physical for the patient. 5.  The patient will follow-up per standard postop protocol.  They can call the clinic they have any questions, new symptoms develop or symptoms worsen.  The procedure was discussed with the patient, as were the potential risks (including bleeding, infection, nerve and/or blood vessel injury, persistent or recurrent pain, failure of the hardware, axillary nerve injury, stress fracture, dislocation, need for further surgery, blood clots, strokes, heart attacks and/or arhythmias, pneumonia, etc.) and benefits.  The patient states his understanding and wishes to proceed.   This office visit took 45 minutes, of which >50% involved patient counseling/education.  Review of the [place] CSRS was performed in accordance of the [facility] prior to dispensing any controlled drugs.  This note was generated in part with voice recognition software and I apologize for any typographical errors that were not detected and corrected.  DOROTHA Gustavo Level, PA-C, CAQ-OS [facility]

## 2024-05-28 NOTE — Progress Notes (Signed)
 Virtual Visit via Telephone Note   Because of DERIC BOCOCK co-morbid illnesses, he is at least at moderate risk for complications without adequate follow up.  This format is felt to be most appropriate for this patient at this time.  Due to technical limitations with video connection (technology), today's appointment will be conducted as an audio only telehealth visit, and SHAIL URBAS verbally agreed to proceed in this manner.   All issues noted in this document were discussed and addressed.  No physical exam could be performed with this format.  Evaluation Performed:  Preoperative cardiovascular risk assessment _____________   Date:  05/28/2024   Patient ID:  Krikor, Willet 1955-12-30, MRN [id number] Patient Location:  Home Provider location:   Office  Primary Care Provider:  Gasper Nancyann BRAVO, MD Primary Cardiologist:  Evalene Lunger, MD  Chief Complaint / Patient Profile   68 y.o. y/o male with a h/o COPD, HTN, CAD, hyperlipidemia who is pending left total shoulder arthroplasty and biceps tenodesis with Dr. Edie on 06/02/24 and presents today for telephonic preoperative cardiovascular risk assessment.  History of Present Illness    LONZY MATO is a 68 y.o. male who presents via audio/video conferencing for a telehealth visit today.  Pt was last seen in cardiology clinic on 11/12/23 by Dr. Gollan.  At that time DHANUSH JOKERST was doing well.  The patient is now pending procedure as outlined above. Since his last visit, he  denies chest pain, lower extremity edema, fatigue, palpitations, melena, hematuria, hemoptysis, diaphoresis, weakness, presyncope, syncope, orthopnea, and PND. He reports chronic shortness of breath that he feels is stable. He is able to achieve > 4 METS activity being active with house and yard work and occasional walking for exercise.   Past Medical History    Past Medical History:  Diagnosis Date   Abdominal bruit    Aortic aneurysm (HCC)    Arthritis     Atherosclerosis of aorta (HCC)    Barrett esophagus    Cholesterol blood lowered    Complex tear of lateral meniscus of right knee as current injury 03/20/2019   Complex tear of medial meniscus of right knee as current injury 03/20/2019   Coronary artery disease    Cutaneous lipodystrophy    Dental bridge present    permanent.  Bottom front   Diverticulosis of colon    DOE (dyspnea on exertion)    ED (erectile dysfunction) of organic origin    Elevated lipids    GERD (gastroesophageal reflux disease)    History of adenomatous polyp of colon    History of Barrett's esophagus    History of rectal bleeding    Hypertension    Low testosterone  in male    Lung nodule    Myocardial infarction (HCC) 08/2006   With occlusion of RCA, stents x2   Peripheral artery disease (HCC)    PONV (postoperative nausea and vomiting)    Past Surgical History:  Procedure Laterality Date   ANTERIOR CERVICAL DECOMP/DISCECTOMY FUSION N/A 09/06/2020   Procedure: Anterior Cervical Decompression Discectomy Fusion Cervical four-five;  Surgeon: Louis Shove, MD;  Location: [facility] OR;  Service: Neurosurgery;  Laterality: N/A;   APPENDECTOMY     BACK SURGERY     CARDIAC CATHETERIZATION     CARPAL TUNNEL RELEASE Left 09/22/2018   Procedure: CARPAL TUNNEL RELEASE;  Surgeon: Mardee Lynwood SQUIBB, MD;  Location: [facility] ORS;  Service: Orthopedics;  Laterality: Left;   CATARACT EXTRACTION W/PHACO Right 01/02/2023  Procedure: CATARACT EXTRACTION PHACO AND INTRAOCULAR LENS PLACEMENT (IOC) RIGHT;  Surgeon: Mittie Gaskin, MD;  Location: [facility];  Service: Ophthalmology;  Laterality: Right;  9.73 1:06.4   CATARACT EXTRACTION W/PHACO Left 06/05/2023   Procedure: CATARACT EXTRACTION PHACO AND INTRAOCULAR LENS PLACEMENT (IOC) LEFT CLAREON TORIC 8.54 00:48.8;  Surgeon: Mittie Gaskin, MD;  Location: [facility];  Service: Ophthalmology;  Laterality: Left;   CHONDROPLASTY Right 05/11/2019   Procedure:  CHONDROPLASTY;  Surgeon: Mardee Lynwood SQUIBB, MD;  Location: [facility] ORS;  Service: Orthopedics;  Laterality: Right;   COLON SURGERY     hyperplastic colon polyp removal   COLONOSCOPY WITH PROPOFOL  N/A 11/08/2017   Procedure: COLONOSCOPY WITH PROPOFOL ;  Surgeon: Viktoria Lamar DASEN, MD;  Location: [facility] ENDOSCOPY;  Service: Endoscopy;  Laterality: N/A;   COLONOSCOPY WITH PROPOFOL  N/A 01/06/2024   Procedure: COLONOSCOPY WITH PROPOFOL ;  Surgeon: Onita Elspeth Sharper, DO;  Location: [facility];  Service: Gastroenterology;  Laterality: N/A;   CORONARY ANGIOPLASTY     CORONARY STENT PLACEMENT  2007   x2   ESOPHAGOGASTRODUODENOSCOPY N/A 11/20/2021   Procedure: ESOPHAGOGASTRODUODENOSCOPY (EGD);  Surgeon: Onita Elspeth Sharper, DO;  Location: [facility];  Service: Gastroenterology;  Laterality: N/A;   ESOPHAGOGASTRODUODENOSCOPY (EGD) WITH PROPOFOL  N/A 11/08/2017   Procedure: ESOPHAGOGASTRODUODENOSCOPY (EGD) WITH PROPOFOL ;  Surgeon: Viktoria Lamar DASEN, MD;  Location: [facility] ENDOSCOPY;  Service: Endoscopy;  Laterality: N/A;   ESOPHAGOGASTRODUODENOSCOPY (EGD) WITH PROPOFOL  N/A 01/06/2024   Procedure: ESOPHAGOGASTRODUODENOSCOPY (EGD) WITH PROPOFOL ;  Surgeon: Onita Elspeth Sharper, DO;  Location: [facility] ENDOSCOPY;  Service: Gastroenterology;  Laterality: N/A;   EYE SURGERY     GANGLION CYST EXCISION Right    JOINT REPLACEMENT     KNEE ARTHROSCOPY Right 05/11/2019   Procedure: ARTHROSCOPY KNEE- partial medial/lateral menisectomy ;  Surgeon: Mardee Lynwood SQUIBB, MD;  Location: [facility] ORS;  Service: Orthopedics;  Laterality: Right;   POLYPECTOMY  01/06/2024   Procedure: POLYPECTOMY;  Surgeon: Onita Elspeth Sharper, DO;  Location: [facility];  Service: Gastroenterology;;   TONSILLECTOMY AND ADENOIDECTOMY     UPPER GI ENDOSCOPY  04/23/2014   barretts esophagus, repeat 04/2017    Allergies  Allergies  Allergen Reactions   Codeine Nausea Only   Imdur  [Isosorbide  Nitrate]     HEADACHES   Simvastatin     Muscle  ache     Home Medications    Prior to Admission medications   Medication Sig Start Date End Date Taking? Authorizing Provider  acetaminophen  (TYLENOL ) 500 MG tablet Take 1,000 mg by mouth every 6 (six) hours as needed for moderate pain or headache.     [provider]  aspirin  EC 81 MG tablet Take 1 tablet (81 mg total) by mouth daily. Swallow whole. 08/23/20   Gollan, Timothy J, MD  atorvastatin  (LIPITOR ) 80 MG tablet TAKE 1 TABLET EVERY DAY 11/15/23   Gollan, Timothy J, MD  diphenhydrAMINE  (BENADRYL ) 50 MG tablet Take 50 mg by mouth at bedtime.    [provider]  ezetimibe  (ZETIA ) 10 MG tablet TAKE 1 TABLET EVERY DAY 11/15/23   Gollan, Timothy J, MD  lisinopril  (ZESTRIL ) 10 MG tablet TAKE 1 TABLET EVERY DAY 11/15/23   Gollan, Timothy J, MD  metoprolol  succinate (TOPROL -XL) 25 MG 24 hr tablet Take 1 tablet (25 mg total) by mouth daily. 11/12/23   Gollan, Timothy J, MD  nitroGLYCERIN  (NITROSTAT ) 0.4 MG SL tablet DISSOLVE 1 TABLET UNDER TONGUE EVERY 5 MIN AS NEED FOR CHEST PAIN. CALL 911 IF NO RELIEF AFTER 3DOSE 01/10/21   Gollan,  Evalene PARAS, MD  pantoprazole  (PROTONIX ) 40 MG tablet Take 40 mg by mouth daily. 10/25/23   [provider]    Physical Exam    Vital Signs:  ORON WESTRUP does not have vital signs available for review today.  Given telephonic nature of communication, physical exam is limited. AAOx3. NAD. Normal affect.  Speech and respirations are unlabored.  Accessory Clinical Findings    None  Assessment & Plan    1.  Preoperative Cardiovascular Risk Assessment: According to the Revised Cardiac Risk Index (RCRI), his Perioperative Risk of Major Cardiac Event is (%): 0.9. His Functional Capacity in METs is: 8.23 according to the Duke Activity Status Index (DASI). The patient is doing well from a cardiac perspective. Therefore, based on ACC/AHA guidelines, the patient would be at acceptable risk for the planned procedure without further cardiovascular testing.    The patient was advised that if he develops new symptoms prior to surgery to contact our office to arrange for a follow-up visit, and he verbalized understanding.  Clearance states that patient will continue aspirin  through the perioperative period.  Patient states he believes he was told to hold aspirin , but he will clarify at pretesting appointment.  Ideally aspirin  should be continued without interruption, however if the bleeding risk is too great, aspirin  may be held for 5-7 days prior to surgery. Please resume aspirin  post operatively when it is felt to be safe from a bleeding standpoint.   A copy of this note will be routed to requesting surgeon.  Time:   Today, I have spent 10 minutes with the patient with telehealth technology discussing medical history, symptoms, and management plan.     Rosaline EMERSON Bane, NP-C  05/28/2024, 9:41 AM [address] [place], [place] [address] Office [phone] Fax [phone]

## 2024-06-01 ENCOUNTER — Encounter: Payer: Self-pay | Admitting: Surgery

## 2024-06-01 NOTE — Progress Notes (Addendum)
 Perioperative / Anesthesia Services  Pre-Admission Testing Clinical Review / Pre-Operative Anesthesia Consult  Date: 06/01/24  PATIENT DEMOGRAPHICS: Name: Patrick Barrera DOB: 1956/01/16 MRN:   [id number]  Note: Available PAT nursing documentation and vital signs have been reviewed. Clinical nursing staff has updated patient's PMH/PSHx, current medication list, and drug allergies/intolerances to ensure complete and comprehensive history available to assist care teams in MDM as it pertains to the aforementioned surgical procedure and anticipated anesthetic course. Extensive review of available clinical information personally performed. [place] PMH and PSHx updated with any diagnoses/procedures that  may have been inadvertently omitted during his intake with the pre-admission testing department's nursing staff.  PLANNED SURGICAL PROCEDURE(S):   Case: [id number] Date/Time: 06/02/24 1020   Procedures:      ARTHROPLASTY, SHOULDER, TOTAL (Left: Shoulder)     TENODESIS, BICEPS (Left: Shoulder)   Anesthesia type: Choice   Diagnosis:      Rotator cuff tendinitis, left [M75.82]     Left shoulder pain, unspecified chronicity [M25.512]     Primary osteoarthritis of left shoulder [M19.012]   Pre-op diagnosis:      Rotator cuff tendinitis, left M75.82     Left shoulder pain, unspecified chronicity M25.512     Primary osteoarthritis of left shoulder M19.012   Location: [facility] OR ROOM 02 / [facility]   Surgeons: Edie Norleen PARAS, MD        CLINICAL DISCUSSION: Patrick Barrera is a 68 y.o. male who is submitted for pre-surgical anesthesia review and clearance prior to him undergoing the above procedure. Patient is a Former Smoker (45 pack years; quit 07/2012). Pertinent PMH includes: CAD, inferoposterior MI, infrarenal AAA, PAD, aortic atherosclerosis, HTN, HLD, COPD, DOE, pulmonary nodule, GERD (on daily PPI), Barrett's esophagus, OA, LEFT rotator cuff tendinitis.  Patient is followed  by cardiology (Gollan, MD). He was last seen in the cardiology clinic on 11/12/2023; notes reviewed. At the time of his clinic visit, patient doing well overall from a cardiovascular perspective. Patient denied any chest pain, shortness of breath, PND, orthopnea, palpitations, significant peripheral edema, weakness, fatigue, vertiginous symptoms, or presyncope/syncope. Patient with a past medical history significant for cardiovascular diagnoses. Documented physical exam was grossly benign, providing no evidence of acute exacerbation and/or decompensation of the patient's known cardiovascular conditions.  Patient suffered an inferoposterior MI on 08/15/2006.  Diagnostic LEFT heart catheterization was performed revealing multivessel CAD. IRA determined to be at 100% occlusion of the proximal RCA.  PCI was subsequently performed placing a 3.0 x 12 mm ACS Multi-Link Rx Vision BMS and a 3.0 x 28 mm Multi-Link OTW Vision BMS to the RCA.  Procedure yielded excellent angiographic result and TIMI-3 flow.  Most recent myocardial perfusion imaging study was performed on 08/29/2021 revealing a  normal left ventricular systolic function with an EF of 57%.  Gated images demonstrated basal inferior/inferolateral dyskinesis; question artifact.  Coronary artery calcification/stents and aortic atherosclerosis was observed.  SPECT images demonstrated no evidence of significant stress-induced myocardial ischemia, however of the inferior wall partially obscured by diaphragmatic attenuation and extracardiac activity; no scintigraphic evidence of scar.  TID ratio = 0.95. Study determined to be  low risk, though specificity and sensitivity degraded by body habitus and artifact.  Aorta duplex performed on 09/17/2023 revealed aneurysmal dilatation of the distal aspect of the infrarenal abdominal aorta.  Aortic measurements: Proximal = 2.7 cm, mid = 2.4 cm, and distal = 3.0 cm.  Additionally, there was aneurysmal dilatation of the  patient's BILATERAL common  iliac arteries, with the left measuring 1.9 cm and the right measuring 1.8 cm.  Blood pressure well controlled at 118/72 mmHg on currently prescribed ACEi (lisinopril ) and beta-blocker (metoprolol  succinate therapies.  Patient is on lovastatin + ezetimibe  for his HLD diagnosis and ASCVD prevention.  Patient has a supply of short acting nitrates (NTG) to use on a as needed basis for recurrent angina/anginal equivalent symptoms; denied recent use.  Patient does not have an OSAH diagnosis. Patient is able to complete all of his  ADL/IADLs without cardiovascular limitation.  Per the DASI, patient is able to achieve at least 4 METS of physical activity without experiencing any significant degree of angina/anginal equivalent symptoms. No changes were made to his medication regimen during his visit with cardiology.  Patient scheduled to follow-up with outpatient cardiology in 12 months or sooner if needed.  Patrick Barrera is scheduled for an elective ARTHROPLASTY, SHOULDER, TOTAL (Left: Shoulder); TENODESIS, BICEPS (Left: Shoulder) on 06/02/2024 with Dr. Norleen JINNY Maltos, MD. Given patient's past medical history significant for cardiovascular diagnoses, presurgical cardiac clearance was sought by the PAT team.  Per cardiology, according to the Revised Cardiac Risk Index (RCRI), his Perioperative Risk of Major Cardiac Event is (%): 0.9. His Functional Capacity in METs is: 8.23 according to the [facility] Activity Status Index (DASI). The patient is doing well from a cardiac perspective. Therefore, based on ACC/AHA guidelines, the patient would be at ACCEPTABLE risk for the planned procedure without further cardiovascular testing.  In review of the patient's chart, it is noted that he is on daily oral antithrombotic therapy. Given that patient's past medical history is significant for cardiovascular diagnoses, including but not limited to CAD, orthopedics has cleared patient to continue his daily low  dose ASA throughout his perioperative course.  Patient has been updated on these directives from his specialty care providers by the PAT team.  Patient reports previous perioperative complications with anesthesia in the past. Patient has a PMH (+) for PONV. Symptoms and history of PONV will be discussed with patient by anesthesia team on the day of her procedure. Interventions will be ordered as deemed necessary based on patient's individual care needs as determined by anesthesiologist. In review his EMR, it is noted that patient underwent a general anesthetic course here at [facility] (ASA III) in 12/2023 without documented complications.   MOST RECENT VITAL SIGNS:    05/26/2024    8:53 AM 04/20/2024    7:00 AM 02/03/2024   11:12 AM  Vitals with BMI  Height 5' 9 5' 9   Weight 248 lbs 2 oz 245 lbs   BMI 36.62 36.16   Systolic 146  139  Diastolic 77  75  Pulse 57  66   PROVIDERS/SPECIALISTS: NOTE: Primary physician provider listed below. Patient may have been seen by APP or partner within same practice.   PROVIDER ROLE / SPECIALTY LAST OV  Poggi, Norleen JINNY, MD Orthopedics (Surgeon) 05/28/2024  Gasper Nancyann BRAVO, MD Primary Care Provider 02/03/2024  Perla Lye, MD Cardiology 12/02/2023; preop APP call 05/28/2024   ALLERGIES: Allergies  Allergen Reactions   Codeine Nausea Only   Imdur  [Isosorbide  Nitrate]     HEADACHES   Simvastatin     Muscle ache    CURRENT HOME MEDICATIONS: No current facility-administered medications for this encounter.    acetaminophen  (TYLENOL ) 500 MG tablet   aspirin  EC 81 MG tablet   atorvastatin  (LIPITOR ) 80 MG tablet   diphenhydrAMINE  (BENADRYL ) 50 MG tablet  ezetimibe  (ZETIA ) 10 MG tablet   lisinopril  (ZESTRIL ) 10 MG tablet   metoprolol  succinate (TOPROL -XL) 25 MG 24 hr tablet   nitroGLYCERIN  (NITROSTAT ) 0.4 MG SL tablet   pantoprazole  (PROTONIX ) 40 MG tablet   HISTORY: Past Medical History:  Diagnosis Date    Abdominal bruit    Acute MI, inferoposterior wall (HCC) 08/15/2006   a.) LHC/PCI 08/15/2006 --> MV CAD with IRA being 100% pRCA (3.0 x 12 mm ACS Multi-Link Rx Vision BMS and 3.0 x 28 mm Multi-Link OTW Vision BMS)   Aneurysm of common iliac artery (BILATERAL)    Arthritis    Atherosclerosis of aorta (HCC)    Barrett esophagus    Complex tear of lateral meniscus of right knee as current injury 03/20/2019   COPD (chronic obstructive pulmonary disease) (HCC)    Coronary artery disease    Cutaneous lipodystrophy    DDD (degenerative disc disease), cervical    a.) s/p ACDF C4-C5   Dental bridge present (lower anterior)    Diverticulosis    Diverticulosis of colon    DOE (dyspnea on exertion)    ED (erectile dysfunction) of organic origin    GERD (gastroesophageal reflux disease)    Hepatic steatosis    History of adenomatous polyp of colon    History of Barrett's esophagus    History of rectal bleeding    HLD (hyperlipidemia)    Hyperplastic colon polyp    Hypertension    Infrarenal abdominal aortic aneurysm (AAA) without rupture (HCC)    Long-term use of aspirin  therapy    Low testosterone  in male    Lung nodule    Peripheral artery disease (HCC)    PONV (postoperative nausea and vomiting)    Status post bilateral cataract extraction 2024   Past Surgical History:  Procedure Laterality Date   ANTERIOR CERVICAL DECOMP/DISCECTOMY FUSION N/A 09/06/2020   Procedure: Anterior Cervical Decompression Discectomy Fusion Cervical four-five;  Surgeon: Louis Shove, MD;  Location: [facility] OR;  Service: Neurosurgery;  Laterality: N/A;   APPENDECTOMY N/A 1982   BACK SURGERY     CARPAL TUNNEL RELEASE Left 09/22/2018   Procedure: CARPAL TUNNEL RELEASE;  Surgeon: Mardee Patrick SQUIBB, MD;  Location: [facility] ORS;  Service: Orthopedics;  Laterality: Left;   CATARACT EXTRACTION W/PHACO Right 01/02/2023   Procedure: CATARACT EXTRACTION PHACO AND INTRAOCULAR LENS PLACEMENT (IOC) RIGHT;  Surgeon: Mittie Gaskin, MD;  Location: [facility];  Service: Ophthalmology;  Laterality: Right;  9.73 1:06.4   CATARACT EXTRACTION W/PHACO Left 06/05/2023   Procedure: CATARACT EXTRACTION PHACO AND INTRAOCULAR LENS PLACEMENT (IOC) LEFT CLAREON TORIC 8.54 00:48.8;  Surgeon: Mittie Gaskin, MD;  Location: [facility];  Service: Ophthalmology;  Laterality: Left;   CHONDROPLASTY Right 05/11/2019   Procedure: CHONDROPLASTY;  Surgeon: Mardee Patrick SQUIBB, MD;  Location: [facility] ORS;  Service: Orthopedics;  Laterality: Right;   COLONOSCOPY WITH PROPOFOL  N/A 11/08/2017   Procedure: COLONOSCOPY WITH PROPOFOL ;  Surgeon: Viktoria Lamar DASEN, MD;  Location: [facility] END[facility]COPY;  Service: Endoscopy;  Laterality: N/A;   COLONOSCOPY WITH PROPOFOL  N/A 01/06/2024   Procedure: COLONOSCOPY WITH PROPOFOL ;  Surgeon: Onita Elspeth Sharper, DO;  Location: [facility] ENDOSCOPY;  Service: Gastroenterology;  Laterality: N/A;   CORONARY ANGIOPLASTY WITH STENT PLACEMENT Left 08/15/2006   Procedure: CORONARY ANGIOPLASTY WITH STENT PLACEMENT; Location: [facility]; Surgeon: Sharper Estelle, MD   ESOPHAGOGASTRODUODENOSCOPY N/A 11/20/2021   Procedure: ESOPHAGOGASTRODUODENOSCOPY (EGD);  Surgeon: Onita Elspeth Sharper, DO;  Location: [facility] ENDOSCOPY;  Service: Gastroenterology;  Laterality: N/A;   ESOPHAGOGASTRODUODENOSCOPY (EGD) WITH PROPOFOL   N/A 11/08/2017   Procedure: ESOPHAGOGASTRODUODENOSCOPY (EGD) WITH PROPOFOL ;  Surgeon: Viktoria Lamar DASEN, MD;  Location: [facility] ENDOSCOPY;  Service: Endoscopy;  Laterality: N/A;   ESOPHAGOGASTRODUODENOSCOPY (EGD) WITH PROPOFOL  N/A 01/06/2024   Procedure: ESOPHAGOGASTRODUODENOSCOPY (EGD) WITH PROPOFOL ;  Surgeon: Onita Elspeth Sharper, DO;  Location: [facility] ENDOSCOPY;  Service: Gastroenterology;  Laterality: N/A;   GANGLION CYST EXCISION Right    KNEE ARTHROSCOPY Right 05/11/2019   Procedure: ARTHROSCOPY KNEE- partial medial/lateral menisectomy ;  Surgeon: Mardee Patrick SQUIBB, MD;  Location: [facility] ORS;  Service:  Orthopedics;  Laterality: Right;   POLYPECTOMY  01/06/2024   Procedure: POLYPECTOMY;  Surgeon: Onita Elspeth Sharper, DO;  Location: [facility] END[facility]COPY;  Service: Gastroenterology;;   TONSILLECTOMY AND ADENOIDECTOMY     TOTAL KNEE ARTHROPLASTY Left 12/13/2021   Procedure: ROBOT ASSISTED LEFT TOTAL KNEE ARTHROPLASTY; Surgeon: Chrys Dene Bailey, MD; Location: [facility] OR; Service: Orthopedics; Laterality: Left;   UPPER GI ENDOSCOPY  04/23/2014   barretts esophagus, repeat 04/2017   Family History  Problem Relation Age of Onset   Heart disease Father        Stents placed   Pulmonary fibrosis Father    Social History   Tobacco Use   Smoking status: Former    Current packs/day: 0.00    Average packs/day: 1 pack/day for 45.0 years (45.0 ttl pk-yrs)    Types: Cigarettes, E-cigarettes    Start date: 07/13/1967    Quit date: 07/12/2012    Years since quitting: 11.8   Smokeless tobacco: Never   Tobacco comments:    changed from cigarettes to Vaping in 2013. Previousl 1.5 ppd since 12  Substance Use Topics   Alcohol use: Yes    Alcohol/week: 1.0 standard drink of alcohol    Types: 1 Standard drinks or equivalent per week    Comment: moderate   LABS:  Hospital Outpatient Visit on 05/26/2024  Component Date Value Ref Range Status   WBC 05/26/2024 5.8  4.0 - 10.5 K/uL Final   RBC 05/26/2024 4.33  4.22 - 5.81 MIL/uL Final   Hemoglobin 05/26/2024 14.2  13.0 - 17.0 g/dL Final   HCT 91/80/7974 42.0  39.0 - 52.0 % Final   MCV 05/26/2024 97.0  80.0 - 100.0 fL Final   MCH 05/26/2024 32.8  26.0 - 34.0 pg Final   MCHC 05/26/2024 33.8  30.0 - 36.0 g/dL Final   RDW 91/80/7974 12.6  11.5 - 15.5 % Final   Platelets 05/26/2024 240  150 - 400 K/uL Final   nRBC 05/26/2024 0.0  0.0 - 0.2 % Final   Neutrophils Relative % 05/26/2024 47  % Final   Neutro Abs 05/26/2024 2.8  1.7 - 7.7 K/uL Final   Lymphocytes Relative 05/26/2024 32  % Final   Lymphs Abs 05/26/2024 1.9  0.7 - 4.0 K/uL Final   Monocytes  Relative 05/26/2024 13  % Final   Monocytes Absolute 05/26/2024 0.7  0.1 - 1.0 K/uL Final   Eosinophils Relative 05/26/2024 6  % Final   Eosinophils Absolute 05/26/2024 0.3  0.0 - 0.5 K/uL Final   Basophils Relative 05/26/2024 1  % Final   Basophils Absolute 05/26/2024 0.1  0.0 - 0.1 K/uL Final   Immature Granulocytes 05/26/2024 1  % Final   Abs Immature Granulocytes 05/26/2024 0.03  0.00 - 0.07 K/uL Final   Performed at [facility], [address] [place], [place] [address]   Sodium 05/26/2024 139  135 - 145 mmol/L Final   Potassium 05/26/2024 4.5  3.5 - 5.1 mmol/L  Final   Chloride 05/26/2024 106  98 - 111 mmol/L Final   CO2 05/26/2024 27  22 - 32 mmol/L Final   Glucose, Bld 05/26/2024 139 (H)  70 - 99 mg/dL Final   Glucose reference range applies only to samples taken after fasting for at least 8 hours.   BUN 05/26/2024 23  8 - 23 mg/dL Final   Creatinine, Ser 05/26/2024 1.15  0.61 - 1.24 mg/dL Final   Calcium  05/26/2024 9.1  8.9 - 10.3 mg/dL Final   Total Protein 91/80/7974 7.1  6.5 - 8.1 g/dL Final   Albumin 91/80/7974 3.4 (L)  3.5 - 5.0 g/dL Final   AST 91/80/7974 34  15 - 41 U/L Final   ALT 05/26/2024 38  0 - 44 U/L Final   Alkaline Phosphatase 05/26/2024 67  38 - 126 U/L Final   Total Bilirubin 05/26/2024 0.4  0.0 - 1.2 mg/dL Final   GFR, Estimated 05/26/2024 >60  >60 mL/min Final   Comment: (NOTE) Calculated using the CKD-EPI Creatinine Equation (2021)    Anion gap 05/26/2024 6  5 - 15 Final   Performed at [facility], [address] [place], [place] [address]   Color, Urine 05/26/2024 YELLOW (A)  YELLOW Final   APPearance 05/26/2024 CLEAR (A)  CLEAR Final   Specific Gravity, Urine 05/26/2024 1.018  1.005 - 1.030 Final   pH 05/26/2024 6.0  5.0 - 8.0 Final   Glucose, UA 05/26/2024 50 (A)  NEGATIVE mg/dL Final   Hgb urine dipstick 05/26/2024 NEGATIVE  NEGATIVE Final   Bilirubin Urine 05/26/2024 NEGATIVE  NEGATIVE Final   Ketones, ur 05/26/2024  NEGATIVE  NEGATIVE mg/dL Final   Protein, ur 91/80/7974 NEGATIVE  NEGATIVE mg/dL Final   Nitrite 91/80/7974 NEGATIVE  NEGATIVE Final   Leukocytes,Ua 05/26/2024 NEGATIVE  NEGATIVE Final   Performed at [facility], [address] [place], [place] [address]   MRSA, PCR 05/26/2024 NEGATIVE  NEGATIVE Final   Staphylococcus aureus 05/26/2024 NEGATIVE  NEGATIVE Final   Comment: (NOTE) The Xpert SA Assay (FDA approved for NASAL specimens in patients 57 years of age and older), is one component of a comprehensive surveillance program. It is not intended to diagnose infection nor to guide or monitor treatment. Performed at [facility], [address] [place], [place] [address]     ECG: Date: 11/12/2023  Time ECG obtained: 0820 AM Rate: 67 bpm Rhythm: normal sinus Axis (leads I and aVF): normal Intervals: PR 128 ms. QRS 94 ms. QTc 433 ms. ST segment and T wave changes: No evidence of acute T wave abnormalities or significant ST segment elevation or depression.  Evidence of a possible, age undetermined, prior infarct:  No Comparison: Similar to previous tracing obtained on 11/05/2022   IMAGING / PROCEDURES: MR SHOULDER LEFT WO CONTRAST performed on 05/08/2024 Severe supraspinatus tendinosis with a small shallow partial-thickness bursal surface tear. Moderate infraspinatus tendinosis. Moderate tendinosis of the intra-articular portion of the long head of the biceps tendon. Moderate-severe osteoarthritis of the glenohumeral joint. Mild subacromial/subdeltoid bursitis.  CT CHEST LUNG CA SCREEN LOW DOSE W/O CM performed on 04/20/2024 Lung-RADS 2, benign appearance or behavior. Continue annual screening with low-dose chest CT without contrast in 12 months. Hepatic steatosis.   Colonic diverticulosis. Coronary artery calcifications.  US  AORTA DUPLEX LIMITED performed on 09/17/2023 Aneurysmal dilatation of the distal, infrarenal abdominal aorta. This is stable in size  when compared to the prior study. Aneurysmal dilatation of the bilateral common iliac arteries. These were  not measured previously.   VAS US  ABI WITH/WO TBI performed on 02/12/2023 Resting right ankle-brachial index is within normal range. The right toe-brachial index is normal.  Resting left ankle-brachial index is within normal range. The left toe-brachial index is normal.    NM MYOCAR MULTI W/SPECT W/WALL MOTION / EF performed on 08/29/2021 Left ventricular ejection fraction is normal (57%) though gated images demonstrate basal inferior/inferolateral dyskinesis (question artifact). Coronary artery calcification/stents are noted as well as aortic atherosclerosis. Possible 7 mm nodule versus prominent/tortuous vessel in the right upper lobe.  Recommend dedicated chest CT for further evaluation. No significant ischemia or scar is observed, though the inferior wall is partially obscured by diaphragmatic attenuation and extracardiac activity.  Probably normal pharmacologic myocardial perfusion stress test.  This is a low risk study, though sensitivity and specificity are degraded by body habitus and artifact.   IMPRESSION AND PLAN: Patrick Barrera has been referred for pre-anesthesia review and clearance prior to him undergoing the planned anesthetic and procedural courses. Available labs, pertinent testing, and imaging results were personally reviewed by me in preparation for upcoming operative/procedural course. [facility] medical record has been updated following extensive record review and patient interview with PAT staff.   This patient has been appropriately cleared by cardiology with an overall ACCEPTABLE risk of patient experiencing significant perioperative cardiovascular complications. Based on clinical review performed today (06/01/24), barring any significant acute changes in the patient's overall condition, it is anticipated that he will be able to proceed with the planned surgical  intervention. Any acute changes in clinical condition may necessitate his procedure being postponed and/or cancelled. Patient will meet with anesthesia team (MD and/or CRNA) on the day of his procedure for preoperative evaluation/assessment. Questions regarding anesthetic course will be fielded at that time.   Pre-surgical instructions were reviewed with the patient during his PAT appointment, and questions were fielded to satisfaction by PAT clinical staff. He has been instructed on which medications that he will need to hold prior to surgery, as well as the ones that have been deemed safe/appropriate to take on the day of his procedure. As part of the general education provided by PAT, patient made aware both verbally and in writing, that he would need to abstain from the use of any illegal substances during his perioperative course. He was advised that failure to follow the provided instructions could necessitate case cancellation or result in serious perioperative complications up to and including death. Patient encouraged to contact PAT and/or his surgeon's office to discuss any questions or concerns that may arise prior to surgery; verbalized understanding.   Dorise Pereyra, MSN, APRN, FNP-C, CEN [facility]  Perioperative Services Nurse Practitioner Phone: [phone] Fax: [phone] 06/01/24 4:04 PM  NOTE: This note has been prepared using Dragon dictation software. Despite my best ability to proofread, there is always the potential that unintentional transcriptional errors may still occur from this process.

## 2024-06-02 ENCOUNTER — Ambulatory Visit
Admission: RE | Admit: 2024-06-02 | Discharge: 2024-06-02 | Disposition: A | Discharge status: Home / Self Care | Attending: Surgery | Admitting: Surgery

## 2024-06-02 ENCOUNTER — Ambulatory Visit: Payer: Self-pay | Admitting: Urgent Care

## 2024-06-02 ENCOUNTER — Other Ambulatory Visit: Payer: Self-pay

## 2024-06-02 ENCOUNTER — Encounter: Payer: Self-pay | Admitting: Surgery

## 2024-06-02 ENCOUNTER — Ambulatory Visit

## 2024-06-02 ENCOUNTER — Encounter
Admission: RE | Disposition: A | Discharge status: Home / Self Care | Payer: Self-pay | Source: Home / Self Care | Attending: Surgery

## 2024-06-02 DIAGNOSIS — E785 Hyperlipidemia, unspecified: Secondary | ICD-10-CM | POA: Insufficient documentation

## 2024-06-02 DIAGNOSIS — I2510 Atherosclerotic heart disease of native coronary artery without angina pectoris: Secondary | ICD-10-CM | POA: Diagnosis not present

## 2024-06-02 DIAGNOSIS — I10 Essential (primary) hypertension: Secondary | ICD-10-CM | POA: Diagnosis not present

## 2024-06-02 DIAGNOSIS — F17290 Nicotine dependence, other tobacco product, uncomplicated: Secondary | ICD-10-CM | POA: Diagnosis not present

## 2024-06-02 DIAGNOSIS — J449 Chronic obstructive pulmonary disease, unspecified: Secondary | ICD-10-CM | POA: Insufficient documentation

## 2024-06-02 DIAGNOSIS — I252 Old myocardial infarction: Secondary | ICD-10-CM | POA: Insufficient documentation

## 2024-06-02 DIAGNOSIS — M7582 Other shoulder lesions, left shoulder: Secondary | ICD-10-CM | POA: Diagnosis present

## 2024-06-02 DIAGNOSIS — I739 Peripheral vascular disease, unspecified: Secondary | ICD-10-CM | POA: Diagnosis not present

## 2024-06-02 DIAGNOSIS — I700 Atherosclerosis of aorta: Secondary | ICD-10-CM | POA: Insufficient documentation

## 2024-06-02 DIAGNOSIS — I7143 Infrarenal abdominal aortic aneurysm, without rupture: Secondary | ICD-10-CM | POA: Insufficient documentation

## 2024-06-02 DIAGNOSIS — M19012 Primary osteoarthritis, left shoulder: Secondary | ICD-10-CM | POA: Diagnosis present

## 2024-06-02 DIAGNOSIS — Z7982 Long term (current) use of aspirin: Secondary | ICD-10-CM | POA: Insufficient documentation

## 2024-06-02 DIAGNOSIS — K219 Gastro-esophageal reflux disease without esophagitis: Secondary | ICD-10-CM | POA: Diagnosis not present

## 2024-06-02 DIAGNOSIS — Z955 Presence of coronary angioplasty implant and graft: Secondary | ICD-10-CM | POA: Diagnosis not present

## 2024-06-02 HISTORY — DX: Hyperlipidemia, unspecified: E78.5

## 2024-06-02 HISTORY — DX: Diverticulosis of intestine, part unspecified, without perforation or abscess without bleeding: K57.90

## 2024-06-02 HISTORY — DX: Aneurysm of iliac artery: I72.3

## 2024-06-02 HISTORY — DX: Long term (current) use of aspirin: Z79.82

## 2024-06-02 HISTORY — DX: Polyp of colon: K63.5

## 2024-06-02 HISTORY — DX: Chronic obstructive pulmonary disease, unspecified: J44.9

## 2024-06-02 HISTORY — DX: Fatty (change of) liver, not elsewhere classified: K76.0

## 2024-06-02 HISTORY — DX: Infrarenal abdominal aortic aneurysm, without rupture: I71.43

## 2024-06-02 HISTORY — DX: Other cervical disc degeneration, unspecified cervical region: M50.30

## 2024-06-02 SURGERY — ARTHROPLASTY, SHOULDER, TOTAL, REVERSE
Anesthesia: General | Site: Shoulder | Laterality: Left

## 2024-06-02 MED ORDER — SODIUM CHLORIDE 0.9 % IV SOLN: INTRAVENOUS | Status: DC

## 2024-06-02 MED ORDER — TRANEXAMIC ACID-NACL 1000-0.7 MG/100ML-% IV SOLN
1000.0000 mg | INTRAVENOUS | Status: AC
  Administered 2024-06-02: 1000 mg via INTRAVENOUS

## 2024-06-02 MED ORDER — ONDANSETRON HCL 4 MG PO TABS: 4.0000 mg | ORAL_TABLET | Freq: Four times a day (QID) | ORAL | Status: DC | PRN

## 2024-06-02 MED ORDER — LACTATED RINGERS IV SOLN: INTRAVENOUS | Status: DC

## 2024-06-02 MED ORDER — TRAMADOL HCL 50 MG PO TABS: 50.0000 mg | ORAL_TABLET | Freq: Four times a day (QID) | ORAL | 0 refills | Status: DC | PRN

## 2024-06-02 MED ORDER — DEXAMETHASONE SODIUM PHOSPHATE 10 MG/ML IJ SOLN
INTRAMUSCULAR | Status: DC | PRN
  Administered 2024-06-02: 10 mg via INTRAVENOUS

## 2024-06-02 MED ORDER — ACETAMINOPHEN 325 MG PO TABS: 325.0000 mg | ORAL_TABLET | Freq: Four times a day (QID) | ORAL | Status: DC | PRN

## 2024-06-02 MED ORDER — SEVOFLURANE IN SOLN
RESPIRATORY_TRACT | Status: AC
  Filled 2024-06-02: qty 250

## 2024-06-02 MED ORDER — ONDANSETRON HCL 4 MG/2ML IJ SOLN
INTRAMUSCULAR | Status: DC | PRN
  Administered 2024-06-02: 4 mg via INTRAVENOUS

## 2024-06-02 MED ORDER — ROCURONIUM BROMIDE 10 MG/ML (PF) SYRINGE
PREFILLED_SYRINGE | INTRAVENOUS | Status: AC
  Filled 2024-06-02: qty 10

## 2024-06-02 MED ORDER — CEFAZOLIN SODIUM-DEXTROSE 2-4 GM/100ML-% IV SOLN
2.0000 g | Freq: Four times a day (QID) | INTRAVENOUS | Status: DC
  Administered 2024-06-02: 2 g via INTRAVENOUS

## 2024-06-02 MED ORDER — SODIUM CHLORIDE 0.9 % IR SOLN
Status: DC | PRN
  Administered 2024-06-02: 3000 mL

## 2024-06-02 MED ORDER — EPHEDRINE SULFATE-NACL 50-0.9 MG/10ML-% IV SOSY
PREFILLED_SYRINGE | INTRAVENOUS | Status: DC | PRN
  Administered 2024-06-02 (×3): 5 mg via INTRAVENOUS

## 2024-06-02 MED ORDER — KETOROLAC TROMETHAMINE 15 MG/ML IJ SOLN
15.0000 mg | Freq: Once | INTRAMUSCULAR | Status: AC
  Administered 2024-06-02: 15 mg via INTRAVENOUS

## 2024-06-02 MED ORDER — PHENYLEPHRINE HCL-NACL 20-0.9 MG/250ML-% IV SOLN
INTRAVENOUS | Status: DC | PRN
  Administered 2024-06-02: 20 ug/min via INTRAVENOUS

## 2024-06-02 MED ORDER — OXYCODONE HCL 5 MG PO TABS
ORAL_TABLET | ORAL | Status: AC
  Filled 2024-06-02: qty 1

## 2024-06-02 MED ORDER — BUPIVACAINE-EPINEPHRINE (PF) 0.5% -1:200000 IJ SOLN
INTRAMUSCULAR | Status: DC | PRN
  Administered 2024-06-02: 30 mL

## 2024-06-02 MED ORDER — BUPIVACAINE-EPINEPHRINE (PF) 0.5% -1:200000 IJ SOLN
INTRAMUSCULAR | Status: AC
  Filled 2024-06-02: qty 30

## 2024-06-02 MED ORDER — BUPIVACAINE LIPOSOME 1.3 % IJ SUSP
INTRAMUSCULAR | Status: DC | PRN
Start: 2024-06-02 — End: 2024-06-02
  Administered 2024-06-02: 20 mL via PERINEURAL

## 2024-06-02 MED ORDER — ONDANSETRON HCL 4 MG/2ML IJ SOLN: 4.0000 mg | Freq: Four times a day (QID) | INTRAMUSCULAR | Status: DC | PRN

## 2024-06-02 MED ORDER — ORAL CARE MOUTH RINSE: 15.0000 mL | Freq: Once | OROMUCOSAL | Status: AC

## 2024-06-02 MED ORDER — CEFAZOLIN SODIUM-DEXTROSE 2-4 GM/100ML-% IV SOLN
INTRAVENOUS | Status: AC
Start: 2024-06-02 — End: 2024-06-02
  Filled 2024-06-02: qty 100

## 2024-06-02 MED ORDER — FENTANYL CITRATE (PF) 100 MCG/2ML IJ SOLN
INTRAMUSCULAR | Status: DC | PRN
  Administered 2024-06-02: 50 ug via INTRAVENOUS
  Administered 2024-06-02: 25 ug via INTRAVENOUS

## 2024-06-02 MED ORDER — BUPIVACAINE LIPOSOME 1.3 % IJ SUSP
INTRAMUSCULAR | Status: AC
  Filled 2024-06-02: qty 20

## 2024-06-02 MED ORDER — BUPIVACAINE HCL (PF) 0.5 % IJ SOLN
INTRAMUSCULAR | Status: DC | PRN
  Administered 2024-06-02: 10 mL via PERINEURAL

## 2024-06-02 MED ORDER — LIDOCAINE HCL (PF) 2 % IJ SOLN
INTRAMUSCULAR | Status: AC
Start: 2024-06-02 — End: 2024-06-02
  Filled 2024-06-02: qty 5

## 2024-06-02 MED ORDER — METOCLOPRAMIDE HCL 10 MG PO TABS: 5.0000 mg | ORAL_TABLET | Freq: Three times a day (TID) | ORAL | Status: DC | PRN

## 2024-06-02 MED ORDER — ROCURONIUM BROMIDE 100 MG/10ML IV SOLN
INTRAVENOUS | Status: DC | PRN
  Administered 2024-06-02: 50 mg via INTRAVENOUS
  Administered 2024-06-02: 10 mg via INTRAVENOUS

## 2024-06-02 MED ORDER — TRAMADOL HCL 50 MG PO TABS: 50.0000 mg | ORAL_TABLET | Freq: Four times a day (QID) | ORAL | Status: DC | PRN

## 2024-06-02 MED ORDER — TRANEXAMIC ACID-NACL 1000-0.7 MG/100ML-% IV SOLN
INTRAVENOUS | Status: AC
  Filled 2024-06-02: qty 100

## 2024-06-02 MED ORDER — ONDANSETRON HCL 4 MG/2ML IJ SOLN
INTRAMUSCULAR | Status: AC
  Filled 2024-06-02: qty 2

## 2024-06-02 MED ORDER — FENTANYL CITRATE PF 50 MCG/ML IJ SOSY
50.0000 ug | PREFILLED_SYRINGE | Freq: Once | INTRAMUSCULAR | Status: AC
  Administered 2024-06-02: 50 ug via INTRAVENOUS

## 2024-06-02 MED ORDER — OXYCODONE HCL 5 MG PO TABS
5.0000 mg | ORAL_TABLET | Freq: Once | ORAL | Status: AC | PRN
  Administered 2024-06-02: 5 mg via ORAL

## 2024-06-02 MED ORDER — CEFAZOLIN SODIUM-DEXTROSE 2-4 GM/100ML-% IV SOLN
2.0000 g | INTRAVENOUS | Status: AC
  Administered 2024-06-02: 2 g via INTRAVENOUS

## 2024-06-02 MED ORDER — DEXAMETHASONE SODIUM PHOSPHATE 10 MG/ML IJ SOLN
INTRAMUSCULAR | Status: AC
  Filled 2024-06-02: qty 1

## 2024-06-02 MED ORDER — PROPOFOL 10 MG/ML IV BOLUS
INTRAVENOUS | Status: DC | PRN
  Administered 2024-06-02: 200 mg via INTRAVENOUS

## 2024-06-02 MED ORDER — CEFAZOLIN SODIUM-DEXTROSE 2-4 GM/100ML-% IV SOLN
INTRAVENOUS | Status: AC
  Filled 2024-06-02: qty 100

## 2024-06-02 MED ORDER — FENTANYL CITRATE PF 50 MCG/ML IJ SOSY
PREFILLED_SYRINGE | INTRAMUSCULAR | Status: AC
  Filled 2024-06-02: qty 1

## 2024-06-02 MED ORDER — PHENYLEPHRINE 80 MCG/ML (10ML) SYRINGE FOR IV PUSH (FOR BLOOD PRESSURE SUPPORT)
PREFILLED_SYRINGE | INTRAVENOUS | Status: DC | PRN
  Administered 2024-06-02: 80 ug via INTRAVENOUS
  Administered 2024-06-02: 160 ug via INTRAVENOUS
  Administered 2024-06-02 (×2): 80 ug via INTRAVENOUS

## 2024-06-02 MED ORDER — PHENYLEPHRINE HCL-NACL 20-0.9 MG/250ML-% IV SOLN
INTRAVENOUS | Status: AC
  Filled 2024-06-02: qty 250

## 2024-06-02 MED ORDER — CHLORHEXIDINE GLUCONATE 0.12 % MT SOLN
15.0000 mL | Freq: Once | OROMUCOSAL | Status: AC
  Administered 2024-06-02: 15 mL via OROMUCOSAL

## 2024-06-02 MED ORDER — METOCLOPRAMIDE HCL 5 MG/ML IJ SOLN: 5.0000 mg | Freq: Three times a day (TID) | INTRAMUSCULAR | Status: DC | PRN

## 2024-06-02 MED ORDER — BUPIVACAINE HCL (PF) 0.5 % IJ SOLN
INTRAMUSCULAR | Status: AC
  Filled 2024-06-02: qty 10

## 2024-06-02 MED ORDER — FENTANYL CITRATE (PF) 100 MCG/2ML IJ SOLN: 25.0000 ug | INTRAMUSCULAR | Status: DC | PRN

## 2024-06-02 MED ORDER — SUGAMMADEX SODIUM 200 MG/2ML IV SOLN
INTRAVENOUS | Status: DC | PRN
  Administered 2024-06-02: 200 mg via INTRAVENOUS

## 2024-06-02 MED ORDER — OXYCODONE HCL 5 MG/5ML PO SOLN: 5.0000 mg | Freq: Once | ORAL | Status: AC | PRN

## 2024-06-02 MED ORDER — LIDOCAINE HCL (CARDIAC) PF 100 MG/5ML IV SOSY
PREFILLED_SYRINGE | INTRAVENOUS | Status: DC | PRN
  Administered 2024-06-02: 60 mg via INTRAVENOUS

## 2024-06-02 MED ORDER — LIDOCAINE HCL (PF) 1 % IJ SOLN
INTRAMUSCULAR | Status: AC
  Filled 2024-06-02: qty 5

## 2024-06-02 MED ORDER — FENTANYL CITRATE (PF) 100 MCG/2ML IJ SOLN
INTRAMUSCULAR | Status: AC
  Filled 2024-06-02: qty 2

## 2024-06-02 MED ORDER — KETOROLAC TROMETHAMINE 15 MG/ML IJ SOLN
INTRAMUSCULAR | Status: AC
  Filled 2024-06-02: qty 1

## 2024-06-02 MED ORDER — CHLORHEXIDINE GLUCONATE 0.12 % MT SOLN
OROMUCOSAL | Status: AC
Start: 2024-06-02 — End: 2024-06-02
  Filled 2024-06-02: qty 15

## 2024-06-02 SURGICAL SUPPLY — 61 items
BAG DECANTER FOR FLEXI CONT (MISCELLANEOUS) ×2 IMPLANT
BASEPLATE GLENOSPHERE 25 (Plate) IMPLANT
BIT DRILL TWIST 2.7 (BIT) IMPLANT
BLADE SAGITTAL WIDE XTHICK NO (BLADE) ×2 IMPLANT
BOWL CEMENT MIX W/ADAPTER (MISCELLANEOUS) ×2 IMPLANT
CHLORAPREP W/TINT 26 (MISCELLANEOUS) ×2 IMPLANT
COMP HUM UNI IDENTI 36 +0 (Joint) IMPLANT
COOLER POLAR GLACIER W/PUMP (MISCELLANEOUS) ×2 IMPLANT
DRAPE INCISE IOBAN 66X45 STRL (DRAPES) ×4 IMPLANT
DRAPE SHEET LG 3/4 BI-LAMINATE (DRAPES) ×4 IMPLANT
DRAPE TABLE BACK 80X90 (DRAPES) ×2 IMPLANT
DRSG OPSITE POSTOP 4X8 (GAUZE/BANDAGES/DRESSINGS) ×2 IMPLANT
ELECT BLADE 6.5 EXT (BLADE) ×2 IMPLANT
ELECT CAUTERY BLADE 6.4 (BLADE) ×2 IMPLANT
ELECTRODE REM PT RTRN 9FT ADLT (ELECTROSURGICAL) ×2 IMPLANT
GAUZE PACK 2X3YD (PACKING) ×2 IMPLANT
GAUZE XEROFORM 1X8 LF (GAUZE/BANDAGES/DRESSINGS) ×2 IMPLANT
GLENOID SPHERE 36MM CVD +3 (Orthopedic Implant) IMPLANT
GLOVE BIO SURGEON STRL SZ7.5 (GLOVE) ×8 IMPLANT
GLOVE BIO SURGEON STRL SZ8 (GLOVE) ×8 IMPLANT
GLOVE BIOGEL PI IND STRL 8 (GLOVE) ×2 IMPLANT
GLOVE INDICATOR 8.0 STRL GRN (GLOVE) ×2 IMPLANT
GOWN STRL REUS W/ TWL LRG LVL3 (GOWN DISPOSABLE) ×2 IMPLANT
GOWN STRL REUS W/ TWL XL LVL3 (GOWN DISPOSABLE) ×2 IMPLANT
HANDLE YANKAUER SUCT OPEN TIP (MISCELLANEOUS) ×2 IMPLANT
HOOD PEEL AWAY T7 (MISCELLANEOUS) ×6 IMPLANT
KIT STABILIZATION SHOULDER (MISCELLANEOUS) ×2 IMPLANT
MANIFOLD NEPTUNE II (INSTRUMENTS) ×2 IMPLANT
MASK FACE SPIDER DISP (MASK) ×2 IMPLANT
MAT ABSORB FLUID 56X50 GRAY (MISCELLANEOUS) ×2 IMPLANT
NDL SAFETY ECLIPSE 18X1.5 (NEEDLE) ×2 IMPLANT
NDL SPNL 20GX3.5 QUINCKE YW (NEEDLE) ×2 IMPLANT
NEEDLE SPNL 20GX3.5 QUINCKE YW (NEEDLE) ×1 IMPLANT
PACK ARTHROSCOPY SHOULDER (MISCELLANEOUS) ×2 IMPLANT
PAD WRAPON POLAR SHDR UNIV (MISCELLANEOUS) ×2 IMPLANT
PENCIL SMOKE EVACUATOR (MISCELLANEOUS) IMPLANT
PIN THREADED REVERSE (PIN) IMPLANT
SCREW BONE CORT 6.5X35MM (Screw) IMPLANT
SCREW BONE LOCKING 4.75X40X3.5 (Screw) IMPLANT
SCREW LOCKING 4.75MMX15MM (Screw) IMPLANT
SCREW LOCKING STRL 4.75X25X3.5 (Screw) IMPLANT
SCREW NON-LOCK 4.75MMX15MM (Screw) IMPLANT
SLING ULTRA II LG (MISCELLANEOUS) IMPLANT
SLING ULTRA II M (MISCELLANEOUS) IMPLANT
SOL .9 NS 3000ML IRR UROMATIC (IV SOLUTION) ×2 IMPLANT
SPONGE T-LAP 18X18 ~~LOC~~+RFID (SPONGE) ×4 IMPLANT
STAPLER SKIN PROX 35W (STAPLE) ×2 IMPLANT
STEM HUMERAL MICRO SZ13 (Stem) IMPLANT
STRAP SAFETY 5IN WIDE (MISCELLANEOUS) ×2 IMPLANT
SUT ETHIBOND 0 MO6 C/R (SUTURE) ×2 IMPLANT
SUT VIC AB 0 CT1 36 (SUTURE) ×4 IMPLANT
SUT VIC AB 2-0 CT1 TAPERPNT 27 (SUTURE) ×4 IMPLANT
SUTURE FIBERWR #2 38 BLUE 1/2 (SUTURE) ×4 IMPLANT
SYR 10ML LL (SYRINGE) ×2 IMPLANT
SYR 30ML LL (SYRINGE) ×4 IMPLANT
SYRINGE TOOMEY IRRIG 70ML (MISCELLANEOUS) ×2 IMPLANT
TAPE TRANSPORE STRL 2 31045 (GAUZE/BANDAGES/DRESSINGS) ×2 IMPLANT
TIP FAN IRRIG PULSAVAC PLUS (DISPOSABLE) ×2 IMPLANT
TRAP FLUID SMOKE EVACUATOR (MISCELLANEOUS) ×2 IMPLANT
TRAY HUMERAL NEUTRAL EXT 6 (Shoulder) IMPLANT
WATER STERILE IRR 500ML POUR (IV SOLUTION) ×2 IMPLANT

## 2024-06-02 NOTE — Anesthesia Procedure Notes (Signed)
 Procedure Name: Intubation Date/Time: 06/02/2024 10:30 AM  Performed by: Niki Manus SAUNDERS, CRNAPre-anesthesia Checklist: Patient identified, Emergency Drugs available, Suction available, Timeout performed and Patient being monitored Patient Re-evaluated:Patient Re-evaluated prior to induction Oxygen Delivery Method: Circle system utilized Preoxygenation: Pre-oxygenation with 100% oxygen Induction Type: IV induction Ventilation: Two handed mask ventilation required Laryngoscope Size: McGrath and 4 Grade View: Grade II Tube type: Oral Tube size: 7.5 mm Number of attempts: 2 Airway Equipment and Method: Stylet and Video-laryngoscopy Placement Confirmation: ETT inserted through vocal cords under direct vision, positive ETCO2 and breath sounds checked- equal and bilateral Secured at: 22 cm Tube secured with: Tape Dental Injury: Teeth and Oropharynx as per pre-operative assessment

## 2024-06-02 NOTE — Anesthesia Procedure Notes (Signed)
 Anesthesia Regional Block: Interscalene brachial plexus block   Pre-Anesthetic Checklist: , timeout performed,  Correct Patient, Correct Site, Correct Laterality,  Correct Procedure, Correct Position, site marked,  Risks and benefits discussed,  Surgical consent,  Pre-op evaluation,  At surgeon's request and post-op pain management  Laterality: Upper and Left  Prep: chloraprep       Needles:  Injection technique: Single-shot  Needle Type: Stimiplex     Needle Length: 9cm  Needle Gauge: 22     Additional Needles:   Procedures:,,,, ultrasound used (permanent image in chart),,    Narrative:  Start time: 06/02/2024 9:09 AM End time: 06/02/2024 9:11 AM Injection made incrementally with aspirations every 5 mL.  Performed by: Personally   Additional Notes: Patient consented for risk and benefits of nerve block including but not limited to nerve damage, Horner's syndrome, failed block, bleeding and infection.  Patient voiced understanding.  Functioning IV was confirmed and monitors were applied.  Timeout done prior to procedure and prior to any sedation being given to the patient.  Patient confirmed procedure site prior to any sedation given to the patient.  A 50mm 22ga Stimuplex needle was used. Sterile prep,hand hygiene and sterile gloves were used.  Minimal sedation used for procedure.  No paresthesia endorsed by patient during the procedure.  Negative aspiration and negative test dose prior to incremental administration of local anesthetic. The patient tolerated the procedure well with no immediate complications.

## 2024-06-02 NOTE — Transfer of Care (Cosign Needed)
 Immediate Anesthesia Transfer of Care Note  Patient: Patrick Barrera  Procedure(s) Performed: ARTHROPLASTY, SHOULDER, TOTAL (Left: Shoulder) TENODESIS, BICEPS (Left: Shoulder)  Patient Location: PACU  Anesthesia Type:General  Level of Consciousness: awake, alert , and patient cooperative  Airway & Oxygen Therapy: Patient Spontanous Breathing and Patient connected to face mask oxygen  Post-op Assessment: Report given to RN and Post -op Vital signs reviewed and stable  Post vital signs: Reviewed and stable  Last Vitals:  Vitals Value Taken Time  BP 121/73 06/02/24 12:45  Temp 97.1   Pulse 65 06/02/24 12:49  Resp 17 06/02/24 12:49  SpO2 93 % 06/02/24 12:49  Vitals shown include unfiled device data.  Last Pain:  Vitals:   06/02/24 0822  TempSrc: Temporal  PainSc: 5          Complications: No notable events documented.

## 2024-06-02 NOTE — Discharge Instructions (Addendum)
 Orthopedic discharge instructions: May shower with intact OpSite dressing once nerve block has worn off (Saturday).  Apply ice frequently to shoulder or use Polar Care device. Take pain medication as prescribed if/when needed.  May supplement with ES Tylenol  if necessary. Keep shoulder immobilizer on at all times except may remove for bathing purposes. Follow-up in 10-14 days or as scheduled.       Interscalene Nerve Block with Exparel    For your surgery you have received an Interscalene Nerve Block with Exparel . Nerve Blocks affect many types of nerves, including nerves that control movement, pain and normal sensation.  You may experience feelings such as numbness, tingling, heaviness, weakness or the inability to move your arm or the feeling or sensation that your arm has fallen asleep. A nerve block with Exparel  can last up to 5 days.  Usually the weakness wears off first.  The tingling and heaviness usually wear off next.  Finally you may start to notice pain.  Keep in mind that this may occur in any order.  Once a nerve block starts to wear off it is usually completely gone within 60 minutes. ISNB may cause mild shortness of breath, a hoarse voice, blurry vision, unequal pupils, or drooping of the face on the same side as the nerve block.  These symptoms will usually resolve with the numbness.  Very rarely the procedure itself can cause mild seizures. If needed, your surgeon will give you a prescription for pain medication.  It will take about 60 minutes for the oral pain medication to become fully effective.  So, it is recommended that you start taking this medication before the nerve block first begins to wear off, or when you first begin to feel discomfort. Take your pain medication only as prescribed.  Pain medication can cause sedation and decrease your breathing if you take more than you need for the level of pain that you have. Nausea is a common side effect of many pain medications.   You may want to eat something before taking your pain medicine to prevent nausea. After an Interscalene nerve block, you cannot feel pain, pressure or extremes in temperature in the effected arm.  Because your arm is numb it is at an increased risk for injury.  To decrease the possibility of injury, please practice the following:  While you are awake change the position of your arm frequently to prevent too much pressure on any one area for prolonged periods of time.  If you have a cast or tight dressing, check the color or your fingers every couple of hours.  Call your surgeon with the appearance of any discoloration (white or blue). If you are given a sling to wear before you go home, please wear it  at all times until the block has completely worn off.  Do not get up at night without your sling. Please contact [facility] or your surgeon if you do not begin to regain sensation after 7 days from the surgery.  Anesthesia may be contacted by calling the Same Day Surgery Department, Mon. through Fri., 6 am to 4 pm at [phone].   If you experience any other problems or concerns, please contact your surgeon's office. If you experience severe or prolonged shortness of breath go to the nearest emergency department.   POLAR CARE INFORMATION  MassAdvertisement.it  How to use [employer] Polar Care [employer] Therapy System?  YouTube   ShippingScam.co.uk  OPERATING INSTRUCTIONS  Start the product With dry hands, connect the  transformer to the electrical connection located on the top of the cooler. Next, plug the transformer into an appropriate electrical outlet. The unit will automatically start running at this point.  To stop the pump, disconnect electrical power.  Unplug to stop the product when not in use. Unplugging the Polar Care unit turns it off. Always unplug immediately after use. Never leave it plugged in while unattended. Remove pad.    FIRST ADD WATER TO FILL LINE,  THEN ICE---Replace ice when existing ice is almost melted  1 Discuss Treatment with your Licensed Health Care Practitioner and Use Only as Prescribed 2 Apply Insulation Barrier & Cold Therapy Pad 3 Check for Moisture 4 Inspect Skin Regularly  Tips and Trouble Shooting Usage Tips 1. Use cubed or chunked ice for optimal performance. 2. It is recommended to drain the Pad between uses. To drain the pad, hold the Pad upright with the hose pointed toward the ground. Depress the black plunger and allow water to drain out. 3. You may disconnect the Pad from the unit without removing the pad from the affected area by depressing the silver tabs on the hose coupling and gently pulling the hoses apart. The Pad and unit will seal itself and will not leak. Note: Some dripping during release is normal. 4. DO NOT RUN PUMP WITHOUT WATER! The pump in this unit is designed to run with water. Running the unit without water will cause permanent damage to the pump. 5. Unplug unit before removing lid.  TROUBLESHOOTING GUIDE Pump not running, Water not flowing to the pad, Pad is not getting cold 1. Make sure the transformer is plugged into the wall outlet. 2. Confirm that the ice and water are filled to the indicated levels. 3. Make sure there are no kinks in the pad. 4. Gently pull on the blue tube to make sure the tube/pad junction is straight. 5. Remove the pad from the treatment site and ll it while the pad is lying at; then reapply. 6. Confirm that the pad couplings are securely attached to the unit. Listen for the double clicks (Figure 1) to confirm the pad couplings are securely attached.  Leaks    Note: Some condensation on the lines, controller, and pads is unavoidable, especially in warmer climates. 1. If using a [employer] Polar Care Cold Therapy unit with a detachable Cold Therapy Pad, and a leak exists (other than condensation on the lines) disconnect the pad couplings. Make sure the silver tabs on the  couplings are depressed before reconnecting the pad to the pump hose; then confirm both sides of the coupling are properly clicked in. 2. If the coupling continues to leak or a leak is detected in the pad itself, stop using it and call [employer] Customer Care at [phone].  Cleaning After use, empty and dry the unit with a soft cloth. Warm water and mild detergent may be used occasionally to clean the pump and tubes.  WARNING: The Polar Care Cube can be cold enough to cause serious injury, including full skin necrosis. Follow these Operating Instructions, and carefully read the Product Insert (see pouch on side of unit) and the Cold Therapy Pad Fitting Instructions (provided with each Cold Therapy Pad) prior to use.    SHOULDER SLING IMMOBILIZER   VIDEO Slingshot 2 Shoulder Brace Application - YouTube ---https://www.porter.info/  INSTRUCTIONS While supporting the injured arm, slide the forearm into the sling. Wrap the adjustable shoulder strap around the neck and shoulders and attach the strap end to  the sling using  the "alligator strap tab."  Adjust the shoulder strap to the required length. Position the shoulder pad behind the neck. To secure the shoulder pad location (optional), pull the shoulder strap away from the shoulder pad, unfold the hook material on the top of the pad, then press the shoulder strap back onto the hook material to secure the pad in place. Attach the closure strap across the open top of the sling. Position the strap so that it holds the arm securely in the sling. Next, attach the thumb strap to the open end of the sling between the thumb and fingers. After sling has been fit, it may be easily removed and reapplied using the quick release buckle on shoulder strap. If a neutral pillow or 15 abduction pillow is included, place the pillow at the waistline. Attach the sling to the pillow, lining up hook material on the pillow with the loop on sling.  Adjust the waist strap to fit.  If waist strap is too long, cut it to fit. Use the small piece of double sided hook material (located on top of the pillow) to secure the strap end. Place the double sided hook material on the inside of the cut strap end and secure it to the waist strap.     If no pillow is included, attach the waist strap to the sling and adjust to fit.    Washing Instructions: Straps and sling must be removed and cleaned regularly depending on your activity level and perspiration. Hand wash straps and sling in cold water with mild detergent, rinse, air dry

## 2024-06-02 NOTE — Anesthesia Postprocedure Evaluation (Signed)
 Anesthesia Post Note  Patient: Patrick Barrera  Procedure(s) Performed: ARTHROPLASTY, SHOULDER, TOTAL, REVERSE (Left: Shoulder) TENODESIS, BICEPS (Left: Shoulder)  Patient location during evaluation: PACU Anesthesia Type: General Level of consciousness: awake and alert Pain management: pain level controlled Vital Signs Assessment: post-procedure vital signs reviewed and stable Respiratory status: spontaneous breathing, nonlabored ventilation and respiratory function stable Cardiovascular status: blood pressure returned to baseline and stable Postop Assessment: no apparent nausea or vomiting Anesthetic complications: no   No notable events documented.   Last Vitals:  Vitals:   06/02/24 1335 06/02/24 1345  BP:  103/80  Pulse: 66 69  Resp: 18 14  Temp:    SpO2: 95% 97%    Last Pain:  Vitals:   06/02/24 1335  TempSrc:   PainSc: 3                  Fairy POUR Trenesha Alcaide

## 2024-06-02 NOTE — Op Note (Signed)
 06/02/2024  12:20 PM  Patient:   Patrick Barrera  Pre-Op Diagnosis:   Advanced degenerative joint disease with rotator cuff tendinopathy, left shoulder.  Post-Op Diagnosis:   Same with biceps tendinopathy.  Procedure:   Reverse left total shoulder arthroplasty with biceps tenodesis.  Surgeon:   DOROTHA Reyes Maltos, MD  Assistant:   Gustavo Level, PA-C  Anesthesia:   General endotracheal with an interscalene block using Exparel  placed preoperatively by the anesthesiologist.  Findings:   As above.  Complications:   None  EBL:   75 cc  Fluids:   400 cc crystalloid  UOP:   None  TT:   None  Drains:   None  Closure:   Staples  Implants:   All press-fit [employer] Comprehensive system with a #13 identity micro-humeral stem, a -6 mm extended neutral identity humeral tray with a standard uniform bearing insert, and a mini-base plate with a 36 mm +3 mm laterally offset glenosphere.  Brief Clinical Note:   The patient is a 68 year old male with a long history of progressively worsening pain of the right shoulder.  The patient's symptoms have progressed despite medications, activity modification, etc. The patient's history and examination are consistent with moderate to severe degenerative joint disease, rotator cuff tendinopathy, and biceps tendinopathy, all of which were confirmed by an MRI scan preoperatively. The patient presents at this time for a reverse left total shoulder arthroplasty with biceps tenodesis.  Procedure:   The patient underwent placement of an interscalene block using Exparel  by the anesthesiologist in the preoperative holding area before being brought into the operating room and lain in the supine position. The patient then underwent general endotracheal intubation and anesthesia before the patient was repositioned in the beach chair position using the beach chair positioner. The left shoulder and upper extremity were prepped with ChloraPrep solution before being draped  sterilely. Preoperative antibiotics were administered. A timeout was performed to verify the appropriate surgical site.    A standard anterior approach to the shoulder was made through an approximately 4-5 inch incision. The incision was carried down through the subcutaneous tissues to expose the deltopectoral fascia. The interval between the deltoid and pectoralis muscles was identified and this plane developed, retracting the cephalic vein laterally with the deltoid muscle. The conjoined tendon was identified. Its lateral margin was dissected and the Kolbel self-retraining retractor inserted. The three sisters were identified and cauterized. Bursal tissues were removed to improve visualization.   The biceps tendon was identified near the inferior aspect of the bicipital groove. A soft tissue tenodesis was performed by attaching the biceps tendon to the adjacent pectoralis major tendon using two #0 Ethibond interrupted sutures. The biceps tendon was then transected just proximal to the tenodesis site. The subscapularis tendon was released from its attachment to the lesser tuberosity 1 cm proximal to its insertion and several tagging sutures placed. The inferior capsule was released with care after identifying and protecting the axillary nerve. The proximal humeral cut was made at approximately 25 of retroversion using the extra-medullary guide.   Attention was redirected to the glenoid. The labrum was debrided circumferentially before the center of the glenoid was marked with electrocautery. The guidewire was drilled into the glenoid vault using the appropriate guide. After verifying its position, it was overreamed with the mini-baseplate reamer to create a flat surface. The permanent mini-baseplate was impacted into place. It was stabilized with a 35 x 6.5 mm central screw and four peripheral screws. Locking screws were placed superiorly,  inferiorly, and anteriorly, while a nonlocking screw was placed  posteriorly. The permanent 36 mm +3 mm lateralized glenosphere was then impacted into place and its Morse taper locking mechanism verified using manual distraction.  Attention was directed to the humeral side. The humeral canal was reamed sequentially beginning with the end-cutting reamer then progressing from a 4 mm reamer up to a 13 mm reamer. This provided excellent circumferential chatter. The canal was broached beginning with a #11 broach and progressing to a #13 broach.  The plastic stem was inserted into the end of the broach and the proximal reaming performed. A trial reduction was performed using the -6 mm extended neutral humeral platform with the +0 mm standard uniform insert. With the +0 mm standard uniform insert, the arm demonstrated excellent range of motion as the hand could be brought across the chest to the opposite shoulder and brought to the top of the patient's head and to the patient's ear. The shoulder appeared stable throughout this range of motion. The joint was dislocated and the trial components removed.   The permanent #13 Identity micro-stem was connected with the -6 mm extended neutral humeral platform on the back table before this construct was impacted into place with care taken to maintain the appropriate version. The +0 mm standard neutral insert was snapped into place. The shoulder was relocated using two finger pressure and again placed through a range of motion with the findings as described above.  The wound was copiously irrigated with sterile saline solution using the jet lavage system before a total of 30 cc of 0.5% Sensorcaine  with epinephrine  was injected into the pericapsular and peri-incisional tissues to help with postoperative analgesia. The subscapularis tendon was reapproximated using #2 FiberWire interrupted sutures. The deltopectoral interval was closed using #0 Vicryl interrupted sutures before the subcutaneous tissues were closed using 2-0 Vicryl interrupted  sutures. The skin was closed using staples. A sterile occlusive dressing was applied to the wound before the arm was placed into a shoulder immobilizer with an abduction pillow. A Polar Care system also was applied to the shoulder. The patient was then transferred back to a hospital bed before being awakened, extubated, and returned to the recovery room in satisfactory condition after tolerating the procedure well.

## 2024-06-02 NOTE — H&P (Signed)
 History of Present Illness:  Patrick Barrera is a 68 y.o. male who presents today as a result of a call to our clinic for left shoulder pain and weakness.   The patient's symptoms began 1 to 2 years ago and developed without any specific cause or injury. Initially, he saw Dr. Tobie approximate 1 year ago and over the past year has received a total of 3 injections. The first injection provided moderate temporary relief of his symptoms, but the 2nd and 3rd provided little if any relief of his symptoms. Therefore, he was advised by Dr. Tobie that he would require a reverse shoulder arthroplasty. The patient was advised by several friends to contact me for this procedure so he has made this appointment. The patient describes the symptoms as marked (major pain with significant limitations) and have the quality of being aching, miserable, nagging, stabbing, tender, and throbbing. The pain is localized to the lateral arm/shoulder. These symptoms are aggravated with sleeping, at higher levels of activity, with overhead activity, and reaching behind the back. He has tried acetaminophen  with limited benefit. He has tried rest and activity modification with no significant benefit. He has tried 3 injections as described above, with decreasing benefit. He denies any neck pain, nor does he note any numbness or paresthesias down his arm to his hand. He is right-hand dominant. This complaint is not work related. He is a sports non-participant.  Shoulder Surgical History:  The patient has had no shoulder surgery in the past.  PMH/PSH/Family History/Social History/Meds/Allergies:  I have reviewed past medical, surgical, social and family history, medications and allergies as documented in the EMR.  Current Outpatient Medications:  acetaminophen  (TYLENOL ) 325 MG tablet Take 3 tablets (975 mg total) by mouth every 8 (eight) hours 90 tablet 0  aspirin  81 MG EC tablet Take 1 tablet (81 mg total) by mouth.  atorvastatin   (LIPITOR ) 80 MG tablet Take 1 tablet by mouth nightly.  diphenhydrAMINE  (BENADRYL ) 50 MG capsule Take 50 mg by mouth nightly as needed for Sleep  ezetimibe  (ZETIA ) 10 mg tablet  lisinopril  (PRINIVIL ,ZESTRIL ) 10 MG tablet Take 10 mg by mouth once daily  metoprolol  succinate (TOPROL -XL) 50 MG XL tablet Take 50 mg by mouth once daily  nitroGLYcerin  (NITROSTAT ) 0.4 MG SL tablet Place 0.4 mg under the tongue every 5 (five) minutes as needed for Chest pain (tab 1 under tongue every 5 minutes as needed for chest pain, call 911 after 3 doses if no improvement)  pantoprazole  (PROTONIX ) 40 MG DR tablet Take 1 tablet (40 mg total) by mouth once daily Take 15-20 mins before meal 90 tablet 3   Allergies:  Codeine Nausea  Ezetimibe -Simvastatin Muscle Pain  Leg cramps   Past Medical History:  Abdominal bruit 09/27/2010  Qualifier: Diagnosis of By: Claudene RN, Megan  Acute myocardial infarction of anterolateral wall, initial episode of care (CMS/HHS-HCC)  Allergic rhinitis  Arthritis  ASCVD (arteriosclerotic cardiovascular disease)  Atherosclerosis of aorta () 09/27/2010  Qualifier: Diagnosis of By: Claudene RN, Megan Last Assessment & Plan: Calcification noted on the balls of the aorta in January 2012. Continue smoking cessation and aggressive cholesterol management.  Barrett's esophagus  Barrett's esophagus determined by endoscopy  Coronary atherosclerosis of native coronary artery 09/27/2010  Qualifier: Diagnosis of By: Perla MD, Tim Last Assessment & Plan: Long discussion today concerning his chest burning episodes. These are rare. Unclear if these are stable angina or secondary to COPD. If symptoms get worse, recommended we do a trial of albuterol  inhaler.  Could start isosorbide  mononitrate 30 mg daily If it is more cardiac in nature, would recommend catheterization if symptoms  Diarrhea, functional 08/21/2018  Diverticulosis  GERD (gastroesophageal reflux disease)  with Barrett's esophagus.  Heart  attack (CMS/HHS-HCC)  History of rectal bleeding 07/25/2017  Hyperlipemia  Hyperplastic colon polyp  Hypertension  Internal hemorrhoids  PONV (postoperative nausea and vomiting)  Smoking greater than 30 pack years 09/27/2010  Qualifier: Diagnosis of By: Perla MD, Tim Last Assessment & Plan: Complimented him on his stopped smoking in October 2014   Past Surgical History:  Procedure Laterality Date  APPENDECTOMY 1982  EGD 05/01/1999 (Barrett's Esophagus)  EGD 05/28/2001 (Barrett's Esophagus)  HEART CATHETERIZATION 2007  WITH STENTS  ANGIOPLASTY / STENTING FEMORAL 08/15/2006  COLONOSCOPY 03/26/2008 (Int Hemorrhoids, Divericulosis: CBF 03/2018)  EGD 03/26/2008 (Barrett's Esophagus)  EGD 04/28/2014 (Barrett's Esophagus: CBF 04/2017)  EGD 11/08/2017 (Barrett's Esophagus: CBF 11/2020)  COLONOSCOPY 11/08/2017 (Adenomatous Polyp: CBF 11/2022)  Left carpal tunnel release 09/22/2018 (Dr Mardee)  Right knee arthroscopy, lateral meniscectomies, and chondroplasty 05/11/2019 (Dr Mardee)  EGD 11/20/2021 (Barrett's Esophagus without dysplasia/Repeat 65yrs/TKT)  ARTHROPLASTY TOTAL KNEE Left 12/13/2021  Procedure: ROBOT ASSISTED LEFT TOTAL KNEE ARTHROPLASTY; Surgeon: Chrys Dene Bailey, MD; Location: [facility] OR; Service: Orthopedics; Laterality: Left;  Colon @ [facility] 01/06/2024 (Hyperplastic polyp/PHx CP/Repeat 87yrs/SMR)  EGD @ [facility] 01/06/2024 (Barrett's Esophagus/Repeat 52yrs/SMR) back surgery  neck surgery  TONSILLECTOMY & ADENOIDECTOMY 1960s   Family History:  Glaucoma Mother  Lung disease Father  No Known Problems Sister  No Known Problems Brother  No Known Problems Son  No Known Problems Daughter  Esophageal cancer Cousin  Esophageal cancer Cousin  some type of esophageal cancer   Social History:   Socioeconomic History:  Marital status: Married  Spouse name: Reena  Number of children: 2  Years of education: 12  Highest education level: High school graduate  Occupational History   Occupation: Full-time- [employer]  Tobacco Use  Smoking status: Former  Current packs/day: 0.00  Average packs/day: 2.0 packs/day for 30.0 years (60.0 ttl pk-yrs)  Types: Cigarettes  Start date: 07/20/1982  Quit date: 07/20/2012  Years since quitting: 11.7  Smokeless tobacco: Never  Vaping Use  Vaping status: Every Day  Substance and Sexual Activity  Alcohol use: Yes  Comment: Social, Occasionally  Drug use: No  Sexual activity: Yes  Partners: Female   Social Drivers of Health:   Food Insecurity: No Food Insecurity (03/30/2024)  Received from [facility]  Hunger Vital Sign  Within the past 12 months, you worried that your food would run out before you got the money to buy more.: Never true  Within the past 12 months, the food you bought just didn't last and you didn't have money to get more.: Never true  Transportation Needs: No Transportation Needs (12/13/2021)  PRAPARE - Risk analyst (Medical): No  Lack of Transportation (Non-Medical): No   Review of Systems:  A comprehensive 14 point ROS was performed, reviewed, and the pertinent orthopaedic findings are documented in the HPI.  Physical Exam:  Vitals:  04/29/24 1025  BP: (!) 150/86  Weight: (!) 112 kg (247 lb)  Height: 177.8 cm (5' 10)  PainSc: 8  PainLoc: Shoulder   General/Constitutional: The patient appears to be well-nourished, well-developed, and in no acute distress. Neuro/Psych: Normal mood and affect, oriented to person, place and time. Eyes: Non-icteric. Pupils are equal, round, and reactive to light, and exhibit synchronous movement. ENT: Unremarkable. Lymphatic: No palpable adenopathy. Respiratory: Lungs clear to auscultation, Normal  chest excursion, No wheezes, and Non-labored breathing Cardiovascular: Regular rate and rhythm. No murmurs. and No edema, swelling or tenderness, except as noted in detailed exam. Integumentary: No impressive skin lesions present, except as noted in  detailed exam. Musculoskeletal: Unremarkable, except as noted in detailed exam.  Left shoulder exam: SKIN: normal SWELLING: none WARMTH: none LYMPH NODES: no adenopathy palpable CREPITUS: none TENDERNESS: Mildly tender over anterolateral shoulder ROM (active):  Forward flexion: 110 degrees Abduction: 80 degrees Internal rotation: Patient declines this motion due to pain ROM (passive):  Forward flexion: 160 degrees Abduction: 130 degrees  ER/IR at 90 abd: Patient declines these motions due to pain  The patient describes moderate to severe pain with internal rotation, abduction, and internal and external rotation at 90 degrees of abduction.  STRENGTH: Forward flexion: 4-4+/5 Abduction: 4/5 External rotation: 4/5 Internal rotation: 4-4+/5 Pain with RC testing: Moderate pain with resisted external rotation and abduction.  STABILITY: Normal  SPECIAL TESTS: Vonzell' test: positive, moderate Speed's test: Mildly positive Capsulitis - pain w/ passive ER: no Crossed arm test: Minimally positive Crank: Not evaluated Anterior apprehension: Negative Posterior apprehension: Not evaluated  He is neurovascularly intact to the left upper extremity.  Imaging:  Shoulder X-Ray Imaging: True AP, Y-scapular, and axillary views of the left shoulder are obtained. These films demonstrate moderate degenerative changes as manifested by joint space narrowing and small inferior humeral osteophyte formation. The subacromial space is mildly decreased. There is no subacromial or infra-clavicular spurring. He demonstrates a Type II acromion which appears to be laterally downsloping.  Assessment:  Rotator cuff tendinitis, left.  Primary osteoarthritis of left shoulder.   Plan:  The treatment options were discussed with the patient. In addition, patient educational materials were provided regarding the diagnosis and treatment options. The patient is quite frustrated by his symptoms and function  limitations, and is ready to consider more aggressive treatment options. Based on his history and examination findings, as well as his plain radiographic findings, I feel that he would most benefit from a reverse left total shoulder arthroplasty. The procedure was discussed with the patient, as were the potential risks (including bleeding, infection, nerve and/or blood vessel injury, persistent or recurrent pain, loosening and/or failure of the components, dislocation, need for further surgery, blood clots, strokes, heart attacks and/or arhythmias, pneumonia, etc.) and benefits. The patient states his understanding and wishes to proceed. In order to help with preoperative planning, I have recommended that he undergo an MRI scan of the left shoulder both to assess the degree of rotator cuff damage as well as to better define the bony anatomy of his glenoid. All of the patient's questions and concerns were answered. He can call any time with further concerns. He will follow up post-surgery, routine.    H&P reviewed and patient re-examined. No changes.

## 2024-06-02 NOTE — Evaluation (Signed)
 Occupational Therapy Evaluation Patient Details Name: Patrick Barrera MRN: [id number] DOB: 12/12/1955 Today's Date: 06/02/2024   History of Present Illness   68 year old male s/p reverse left total shoulder arthroplasty with biceps tenodesis.     Clinical Impressions Patient was seen for an OT evaluation this date. Prior to surgery, pt lives in a one level home with 2 STE with L HR going in with his wife. He was active and independent. Pt has orders for LUE to be immobilized and will be NWBing per MD. He presents with impaired strength/ROM, pain, and sensation to LUE with block not completely resolved yet. These impairments result in a decreased ability to perform self care tasks requiring Max A for UB dressing and application of polar care, compression stockings, and sling/immobilizer. He performed STS, ambulation and stair training with no AD use and close supervision. Pt instructed in polar care mgt, compression stockings mgt, sling/immobilizer mgt, ROM exercises for LUE (with instructions for no shoulder exercises until full sensation has returned), LUE precautions, adaptive strategies for ADL performance, positioning and considerations for sleep, and home/routines modifications to maximize falls prevention, safety, and independence. Handout provided. OT adjusted sling/immobilizer and polar care to improve comfort, optimize positioning, and to maximize skin integrity/safety. Pt and spouse verbalized understanding of all education/training provided. Pt has no further acute OT needs, but recommend HHOT services to continue therapy to maximize return to PLOF, address home/routines modifications and safety, minimize falls risk, and minimize caregiver burden.        If plan is discharge home, recommend the following:   A lot of help with bathing/dressing/bathroom     Functional Status Assessment   Patient has not had a recent decline in their functional status     Equipment  Recommendations   None recommended by OT     Recommendations for Other Services         Precautions/Restrictions         Mobility Bed Mobility               General bed mobility comments: NT up in recliner pre/post session    Transfers Overall transfer level: Modified independent                        Balance Overall balance assessment: No apparent balance deficits (not formally assessed)                                         ADL either performed or assessed with clinical judgement   ADL Overall ADL's : Needs assistance/impaired                 Upper Body Dressing : Maximal assistance;Standing;Sitting           Toileting- Clothing Manipulation and Hygiene: Supervision/safety Toileting - Clothing Manipulation Details (indicate cue type and reason): standing to urinate in toilet and wash hands after     Functional mobility during ADLs: Supervision/safety       Vision         Perception         Praxis         Pertinent Vitals/Pain Pain Assessment Pain Assessment: No/denies pain     Extremity/Trunk Assessment Upper Extremity Assessment Upper Extremity Assessment: Right hand dominant;LUE deficits/detail LUE Deficits / Details: in immobilizer s/p L reverse TSA   Lower Extremity Assessment  Lower Extremity Assessment: Overall WFL for tasks assessed       Communication Communication Communication: No apparent difficulties   Cognition Arousal: Alert Behavior During Therapy: WFL for tasks assessed/performed Cognition: No apparent impairments                               Following commands: Intact       Cueing  General Comments          Exercises Other Exercises Other Exercises: Pt and spoues were educated on shoulder precautions, how to donn/doff immobilizer, ROM, polar care management and ADL performance.   Shoulder Instructions      Home Living Family/patient expects to  be discharged to:: Private residence Living Arrangements: Spouse/significant other Available Help at Discharge: Family;Available 24 hours/day Type of Home: House Home Access: Stairs to enter [employer] of Steps: 2 Entrance Stairs-Rails: Left Home Layout: One level     Bathroom Shower/Tub: Chief Strategy Officer: Handicapped height Bathroom Accessibility: No   Home Equipment: Toilet riser;Shower Counsellor (2 wheels);Adaptive equipment Adaptive Equipment: Reacher        Prior Functioning/Environment Prior Level of Function : Independent/Modified Independent             Mobility Comments: IND-tripped over a bucket last week while washing his truck ADLs Comments: IND    OT Problem List: Decreased range of motion;Decreased strength;Impaired UE functional use   OT Treatment/Interventions:        OT Goals(Current goals can be found in the care plan section)       OT Frequency:       Co-evaluation              AM-PAC OT 6 Clicks Daily Activity     Outcome Measure Help from another person eating meals?: None Help from another person taking care of personal grooming?: A Little Help from another person toileting, which includes using toliet, bedpan, or urinal?: A Little Help from another person bathing (including washing, rinsing, drying)?: A Lot Help from another person to put on and taking off regular upper body clothing?: A Lot Help from another person to put on and taking off regular lower body clothing?: A Lot 6 Click Score: 16   End of Session Equipment Utilized During Treatment:  (LUE immobilizer) Nurse Communication: Mobility status  Activity Tolerance: Patient tolerated treatment well Patient left: in chair;with family/visitor present;with nursing/sitter in room  OT Visit Diagnosis: Other abnormalities of gait and mobility (R26.89)                Time: 8444-8347 OT Time Calculation (min): 57 min Charges:  OT  General Charges $OT Visit: 1 Visit OT Evaluation $OT Eval Moderate Complexity: 1 Mod OT Treatments $Self Care/Home Management : 38-52 mins  Emery Dupuy, OTR/L 06/02/24, 5:24 PM  Jaiyden Laur E Soila Printup 06/02/2024, 5:21 PM

## 2024-06-02 NOTE — Anesthesia Preprocedure Evaluation (Signed)
 Anesthesia Evaluation  Patient identified by MRN, date of birth, ID band Patient awake    Reviewed: Allergy & Precautions, NPO status , Patient's Chart, lab work & pertinent test results  History of Anesthesia Complications (+) PONV and history of anesthetic complications  Airway Mallampati: III  TM Distance: >3 FB Neck ROM: full    Dental  (+) Chipped   Pulmonary neg shortness of breath, COPD, former smoker    + decreased breath sounds      Cardiovascular Exercise Tolerance: Good hypertension, (-) angina + CAD, + Past MI and + Cardiac Stents  Normal cardiovascular exam     Neuro/Psych  Neuromuscular disease  negative psych ROS   GI/Hepatic Neg liver ROS,GERD  Controlled,,  Endo/Other  negative endocrine ROS    Renal/GU      Musculoskeletal   Abdominal   Peds  Hematology negative hematology ROS (+)   Anesthesia Other Findings Past Medical History: No date: Abdominal bruit 08/15/2006: Acute MI, inferoposterior wall (HCC)     Comment:  a.) LHC/PCI 08/15/2006 --> MV CAD with IRA being 100%               pRCA (3.0 x 12 mm ACS Multi-Link Rx Vision BMS and 3.0 x               28 mm Multi-Link OTW Vision BMS) No date: Aneurysm of common iliac artery (BILATERAL) No date: Arthritis No date: Atherosclerosis of aorta (HCC) No date: Barrett esophagus 03/20/2019: Complex tear of lateral meniscus of right knee as current  injury No date: COPD (chronic obstructive pulmonary disease) (HCC) No date: Coronary artery disease No date: Cutaneous lipodystrophy No date: DDD (degenerative disc disease), cervical     Comment:  a.) s/p ACDF C4-C5 No date: Dental bridge present (lower anterior) No date: Diverticulosis No date: Diverticulosis of colon No date: DOE (dyspnea on exertion) No date: ED (erectile dysfunction) of organic origin No date: GERD (gastroesophageal reflux disease) No date: Hepatic steatosis No date: History  of adenomatous polyp of colon No date: History of Barrett's esophagus No date: History of rectal bleeding No date: HLD (hyperlipidemia) No date: Hyperplastic colon polyp No date: Hypertension No date: Infrarenal abdominal aortic aneurysm (AAA) without rupture  (HCC) No date: Long-term use of aspirin  therapy No date: Low testosterone  in male No date: Lung nodule No date: Peripheral artery disease (HCC) No date: PONV (postoperative nausea and vomiting) 2024: Status post bilateral cataract extraction  Past Surgical History: 09/06/2020: ANTERIOR CERVICAL DECOMP/DISCECTOMY FUSION; N/A     Comment:  Procedure: Anterior Cervical Decompression Discectomy               Fusion Cervical four-five;  Surgeon: Louis Shove, MD;                Location: [facility] OR;  Service: Neurosurgery;  Laterality:               N/A; 1982: APPENDECTOMY; N/A No date: BACK SURGERY 09/22/2018: CARPAL TUNNEL RELEASE; Left     Comment:  Procedure: CARPAL TUNNEL RELEASE;  Surgeon: Mardee Lynwood SQUIBB, MD;  Location: [facility] ORS;  Service: Orthopedics;               Laterality: Left; 01/02/2023: CATARACT EXTRACTION W/PHACO; Right     Comment:  Procedure: CATARACT EXTRACTION PHACO AND INTRAOCULAR               LENS PLACEMENT (  IOC) RIGHT;  Surgeon: Mittie Gaskin, MD;  Location: [facility];  Service:               Ophthalmology;  Laterality: Right;  9.73 1:06.4 06/05/2023: CATARACT EXTRACTION W/PHACO; Left     Comment:  Procedure: CATARACT EXTRACTION PHACO AND INTRAOCULAR               LENS PLACEMENT (IOC) LEFT CLAREON TORIC 8.54 00:48.8;                Surgeon: Mittie Gaskin, MD;  Location: [facility];  Service: Ophthalmology;  Laterality: Left; 05/11/2019: CHONDROPLASTY; Right     Comment:  Procedure: CHONDROPLASTY;  Surgeon: Mardee Lynwood SQUIBB, MD;              Location: [facility] ORS;  Service: Orthopedics;  Laterality:               Right; 11/08/2017:  COLONOSCOPY WITH PROPOFOL ; N/A     Comment:  Procedure: COLONOSCOPY WITH PROPOFOL ;  Surgeon: Viktoria Lamar DASEN, MD;  Location: [facility] END[facility]COPY;  Service:               Endoscopy;  Laterality: N/A; 01/06/2024: COLONOSCOPY WITH PROPOFOL ; N/A     Comment:  Procedure: COLONOSCOPY WITH PROPOFOL ;  Surgeon: Onita Elspeth Sharper, DO;  Location: [facility] ENDOSCOPY;  Service:               Gastroenterology;  Laterality: N/A; 08/15/2006: CORONARY ANGIOPLASTY WITH STENT PLACEMENT; Left     Comment:  Procedure: CORONARY ANGIOPLASTY WITH STENT PLACEMENT;               Location: [facility]; Surgeon: Sharper Estelle, MD 11/20/2021: ESOPHAGOGASTRODUODENOSCOPY; N/A     Comment:  Procedure: ESOPHAGOGASTRODUODENOSCOPY (EGD);  Surgeon:               Onita Elspeth Sharper, DO;  Location: [facility] ENDOSCOPY;                Service: Gastroenterology;  Laterality: N/A; 11/08/2017: ESOPHAGOGASTRODUODENOSCOPY (EGD) WITH PROPOFOL ; N/A     Comment:  Procedure: ESOPHAGOGASTRODUODENOSCOPY (EGD) WITH               PROPOFOL ;  Surgeon: Viktoria Lamar DASEN, MD;  Location:               [facility] ENDOSCOPY;  Service: Endoscopy;  Laterality: N/A; 01/06/2024: ESOPHAGOGASTRODUODENOSCOPY (EGD) WITH PROPOFOL ; N/A     Comment:  Procedure: ESOPHAGOGASTRODUODENOSCOPY (EGD) WITH               PROPOFOL ;  Surgeon: Onita Elspeth Sharper, DO;  Location:              [facility] ENDOSCOPY;  Service: Gastroenterology;  Laterality:               N/A; No date: GANGLION CYST EXCISION; Right 05/11/2019: KNEE ARTHROSCOPY; Right     Comment:  Procedure: ARTHROSCOPY KNEE- partial medial/lateral               menisectomy ;  Surgeon: Mardee Lynwood SQUIBB, MD;  Location:               [facility] ORS;  Service: Orthopedics;  Laterality: Right;  01/06/2024: POLYPECTOMY     Comment:  Procedure: POLYPECTOMY;  Surgeon: Onita Elspeth Sharper,              DO;  Location: [facility] END[facility];  Service:               Gastroenterology;; No date: TONSILLECTOMY AND  ADENOIDECTOMY 12/13/2021: TOTAL KNEE ARTHROPLASTY; Left     Comment:  Procedure: ROBOT ASSISTED LEFT TOTAL KNEE ARTHROPLASTY;               Surgeon: Chrys Dene Bailey, MD; Location: [facility] OR;               Service: Orthopedics; Laterality: Left; 04/23/2014: UPPER GI ENDOSCOPY     Comment:  barretts esophagus, repeat 04/2017  BMI    Body Mass Index: 36.48 kg/m      Reproductive/Obstetrics negative OB ROS                              Anesthesia Physical Anesthesia Plan  ASA: 3  Anesthesia Plan: General ETT   Post-op Pain Management: Regional block*   Induction: Intravenous  PONV Risk Score and Plan: Ondansetron , Dexamethasone , Midazolam  and Treatment may vary due to age or medical condition  Airway Management Planned: Oral ETT  Additional Equipment:   Intra-op Plan:   Post-operative Plan: Extubation in OR  Informed Consent: I have reviewed the patients History and Physical, chart, labs and discussed the procedure including the risks, benefits and alternatives for the proposed anesthesia with the patient or authorized representative who has indicated his/her understanding and acceptance.     Dental Advisory Given  Plan Discussed with: Anesthesiologist, CRNA and Surgeon  Anesthesia Plan Comments: (Patient consented for risks of anesthesia including but not limited to:  - adverse reactions to medications - damage to eyes, teeth, lips or other oral mucosa - nerve damage due to positioning  - sore throat or hoarseness - Damage to heart, brain, nerves, lungs, other parts of body or loss of life  Patient voiced understanding and assent.)        Anesthesia Quick Evaluation

## 2024-06-03 ENCOUNTER — Ambulatory Visit: Admitting: Family Medicine

## 2024-06-03 ENCOUNTER — Encounter: Payer: Self-pay | Admitting: Surgery

## 2024-06-05 ENCOUNTER — Encounter: Payer: Self-pay | Admitting: Family Medicine

## 2024-06-05 ENCOUNTER — Ambulatory Visit: Admitting: Family Medicine

## 2024-06-05 VITALS — BP 137/89 | HR 92 | Temp 97.4°F | Ht 69.0 in | Wt 245.6 lb

## 2024-06-05 DIAGNOSIS — E119 Type 2 diabetes mellitus without complications: Secondary | ICD-10-CM

## 2024-06-05 LAB — POCT GLYCOSYLATED HEMOGLOBIN (HGB A1C)
Est. average glucose Bld gHb Est-mCnc: 163
Hemoglobin A1C: 7.3 % — AB (ref 4.0–5.6)

## 2024-06-05 NOTE — Patient Instructions (Signed)
 SABRA  Please review the attached list of medications and notify my office if there are any errors.   . Please bring all of your medications to every appointment so we can make sure that our medication list is the same as yours.

## 2024-06-05 NOTE — Progress Notes (Signed)
 Established patient visit   Patient: Patrick Barrera   DOB: 10/21/1955   68 y.o. Male  MRN: [id number] Visit Date: 06/05/2024  Today's healthcare provider: Nancyann Perry, MD   Chief Complaint  Patient presents with   Diabetes    Patient is here for a diabetic follow up, checking his A1C.  Patient denies any symptoms.  Reports that he does not check his glucose at home.   Subjective    Discussed the use of AI scribe software for clinical note transcription with the patient, who gave verbal consent to proceed.  History of Present Illness   Patrick Barrera is a 68 year old male who presents for follow-up of diabetes  His Hemoglobin A1c has increased from 6.6% in April to 7.3% currently. He has not made significant changes to his diet since the last visit, maintaining his usual habits to get a 'true reading' of his condition. He identifies adding two spoons of sugar to his coffee and consuming breads, potatoes, and other starchy foods as contributors to his elevated blood sugar levels. He is currently managing his condition without medication and is concerned about potential side effects of medications like metformin, based on experiences shared by friends.  He underwent a complete shoulder replacement earlier this week due to wear and tear from repetitive motion over the years. He is in the early stages of recovery and has just started physical therapy.  He experiences significant muscle pain in his back when walking short distances, such as across a parking lot, which he attributes to deconditioning.       Medications: Outpatient Medications Prior to Visit  Medication Sig   acetaminophen  (TYLENOL ) 500 MG tablet Take 1,000 mg by mouth every 6 (six) hours as needed for moderate pain or headache.    aspirin  EC 81 MG tablet Take 1 tablet (81 mg total) by mouth daily. Swallow whole.   atorvastatin  (LIPITOR ) 80 MG tablet TAKE 1 TABLET EVERY DAY   diphenhydrAMINE  (BENADRYL ) 50 MG tablet  Take 50 mg by mouth at bedtime.   ezetimibe  (ZETIA ) 10 MG tablet TAKE 1 TABLET EVERY DAY   lisinopril  (ZESTRIL ) 10 MG tablet TAKE 1 TABLET EVERY DAY   metoprolol  succinate (TOPROL -XL) 25 MG 24 hr tablet Take 1 tablet (25 mg total) by mouth daily.   nitroGLYCERIN  (NITROSTAT ) 0.4 MG SL tablet DISSOLVE 1 TABLET UNDER TONGUE EVERY 5 MIN AS NEED FOR CHEST PAIN. CALL 911 IF NO RELIEF AFTER 3DOSE   pantoprazole  (PROTONIX ) 40 MG tablet Take 40 mg by mouth daily.   traMADol  (ULTRAM ) 50 MG tablet Take 1-2 tablets (50-100 mg total) by mouth every 6 (six) hours as needed for moderate pain (pain score 4-6) or severe pain (pain score 7-10).   No facility-administered medications prior to visit.        Objective    BP 137/89 (BP Location: Right Arm, Patient Position: Sitting, Cuff Size: Normal)   Pulse 92   Temp (!) 97.4 F (36.3 C) (Oral)   Ht 5' 9 (1.753 m)   Wt 245 lb 9.6 oz (111.4 kg)   SpO2 96%   BMI 36.27 kg/m   Physical Exam   General: Appearance:    Mildly obese male in no acute distress  Eyes:    PERRL, conjunctiva/corneas clear, EOM's intact       Lungs:     Clear to auscultation bilaterally, respirations unlabored  Heart:    Normal heart rate. Normal rhythm. No murmurs, rubs,  or gallops.    MS:   All extremities are intact.    Neurologic:   Awake, alert, oriented x 3. No apparent focal neurological defect.          Assessment & Plan    1. Diabetes mellitus without complication (HCC) (Primary) He admits to consuming too much starchy foods and not  getting as much exercise since should injury. Discussed options of starting metformin versus additional time working on diet and exercise. He prefers to work on diet and exercise. Will recheck A1c with CPE after the new year. Return in about 5 months (around 11/04/2024) for Yearly Physical.     Nancyann Perry, MD  [facility] [phone] (phone) [phone] (fax)  [facility]

## 2024-08-26 ENCOUNTER — Other Ambulatory Visit: Payer: Self-pay | Admitting: Cardiovascular Disease

## 2024-11-06 ENCOUNTER — Ambulatory Visit (INDEPENDENT_AMBULATORY_CARE_PROVIDER_SITE_OTHER): Payer: Self-pay | Admitting: Family Medicine

## 2024-11-06 ENCOUNTER — Encounter: Payer: Self-pay | Admitting: Family Medicine

## 2024-11-06 VITALS — BP 138/73 | Resp 16 | Ht 69.0 in | Wt 243.0 lb

## 2024-11-06 DIAGNOSIS — K2100 Gastro-esophageal reflux disease with esophagitis, without bleeding: Secondary | ICD-10-CM

## 2024-11-06 DIAGNOSIS — Z0001 Encounter for general adult medical examination with abnormal findings: Secondary | ICD-10-CM | POA: Diagnosis not present

## 2024-11-06 DIAGNOSIS — I25118 Atherosclerotic heart disease of native coronary artery with other forms of angina pectoris: Secondary | ICD-10-CM | POA: Diagnosis not present

## 2024-11-06 DIAGNOSIS — I700 Atherosclerosis of aorta: Secondary | ICD-10-CM

## 2024-11-06 DIAGNOSIS — J449 Chronic obstructive pulmonary disease, unspecified: Secondary | ICD-10-CM | POA: Diagnosis not present

## 2024-11-06 DIAGNOSIS — Z87891 Personal history of nicotine dependence: Secondary | ICD-10-CM | POA: Diagnosis not present

## 2024-11-06 DIAGNOSIS — I714 Abdominal aortic aneurysm, without rupture, unspecified: Secondary | ICD-10-CM | POA: Diagnosis not present

## 2024-11-06 DIAGNOSIS — I739 Peripheral vascular disease, unspecified: Secondary | ICD-10-CM

## 2024-11-06 DIAGNOSIS — E119 Type 2 diabetes mellitus without complications: Secondary | ICD-10-CM | POA: Insufficient documentation

## 2024-11-06 DIAGNOSIS — I10 Essential (primary) hypertension: Secondary | ICD-10-CM

## 2024-11-06 DIAGNOSIS — Z0000 Encounter for general adult medical examination without abnormal findings: Secondary | ICD-10-CM

## 2024-11-06 DIAGNOSIS — E782 Mixed hyperlipidemia: Secondary | ICD-10-CM | POA: Diagnosis not present

## 2024-11-06 DIAGNOSIS — K760 Fatty (change of) liver, not elsewhere classified: Secondary | ICD-10-CM

## 2024-11-06 DIAGNOSIS — Z23 Encounter for immunization: Secondary | ICD-10-CM

## 2024-11-06 NOTE — Progress Notes (Signed)
 "     Established patient visit   Patient: Patrick Barrera   DOB: Oct 04, 1956   69 y.o. Male  MRN: [id number] Visit Date: 11/06/2024  Today's healthcare provider: Nancyann Perry, MD   Chief Complaint  Patient presents with   Annual Exam    CPE    Subjective    Discussed the use of AI scribe software for clinical note transcription with the patient, who gave verbal consent to proceed.  History of Present Illness   Patrick Barrera is a 69 year old male who presents for follow up hypertension, hyperlipidemia, diet controlled diabetes and GERD.   He underwent shoulder replacement last summer and finds it beneficial, though he experiences occasional limitations in movement, describing it as 'sometimes I'll try to make a move and my shoulder says, huh-uh.'  He is not currently taking isosorbide  due to severe headaches as an adverse effect. Previously, he was on Plavix  but discontinued it last year. He continues to take a low-dose aspirin  daily. For glaucoma, he uses timolol  eye drops in both eyes. He is on Lipitor , lisinopril , and metoprolol  for blood pressure management and reports doing well with these medications. He also takes pantoprazole  for reflux and finds it effective.  He quit smoking in 2013 and had a lung scan in July, which showed a 5mm node on one lung that is being monitored. His AAA is checked every three years. He has a blood glucose meter but does not check his blood sugar regularly. He is mindful of his diet, trying to limit intake of French fries and baked potatoes, opting for baked sweet potatoes instead, and is not particularly fond of sweets.     Lab Results  Component Value Date   HGBA1C 7.3 (A) 06/05/2024   HGBA1C 6.6 (A) 02/03/2024   Lab Results  Component Value Date   CHOL 117 11/05/2023   HDL 29 (L) 11/05/2023   LDLCALC 56 11/05/2023   TRIG 191 (H) 11/05/2023   CHOLHDL 4.0 11/05/2023   Lab Results  Component Value Date   NA 139 05/26/2024   K 4.5 05/26/2024    CREATININE 1.15 05/26/2024   GFRNONAA >60 05/26/2024   GLUCOSE 139 (H) 05/26/2024     Medications: Show/hide medication list[1] Review of Systems     Objective    BP 138/73 (BP Location: Left Arm, Patient Position: Sitting, Cuff Size: Large)   Resp 16   Ht 5' 9 (1.753 m)   Wt 243 lb (110.2 kg)   SpO2 98%   BMI 35.88 kg/m   Physical Exam   General: Appearance:    Mildly obese male in no acute distress  Eyes:    PERRL, conjunctiva/corneas clear, EOM's intact       Lungs:     Clear to auscultation bilaterally, respirations unlabored  Heart:    Normal heart rate. Normal rhythm. No murmurs, rubs, or gallops.    MS:   All extremities are intact.    Neurologic:   Awake, alert, oriented x 3. No apparent focal neurological defect.         Assessment & Plan    1. Diabetes mellitus without complication (HCC) Diet controlled.   - Urine Albumin/Creatinine with ratio (send out) [LAB689] - Comprehensive Metabolic Panel (CMET) - HgB A1c - CBC  2. Mixed hyperlipidemia He is tolerating atorvastatin  and ezetimibe  well with no adverse effects.   - Lipid panel  3. Chronic obstructive pulmonary disease, unspecified COPD type (HCC) Well compensated. No longer smoking.  No currently requiring inhalers.   4. Primary hypertension BP near goal. Continue current medications.   - PSA Total (Reflex To Free)  5. History of smoking 30 or more pack years Continue annual LCS  6. Abdominal aortic aneurysm (AAA) without rupture, unspecified partdue 09/2026  7. ATHEROSCLEROSIS OF AORTA Asymptomatic. Compliant with medication.  Continue aggressive risk factor modification.    8. Atherosclerosis of native coronary artery of native heart with stable angina pectoris Asymptomatic. Compliant with medication.  Continue aggressive risk factor modification.    9. Gastroesophageal reflux disease with esophagitis, unspecified whether hemorrhage Well controlled on pantoprazole    - Flu vaccine HIGH  DOSE PF(Fluzone Trivalent)      Nancyann Perry, MD  [facility] [phone] (phone) [phone] (fax)  [facility]    [1]  Outpatient Medications Prior to Visit  Medication Sig   acetaminophen  (TYLENOL ) 500 MG tablet Take 1,000 mg by mouth every 6 (six) hours as needed for moderate pain or headache.    aspirin  EC 81 MG tablet Take 1 tablet (81 mg total) by mouth daily. Swallow whole.   atorvastatin  (LIPITOR ) 80 MG tablet TAKE 1 TABLET EVERY DAY   diphenhydrAMINE  (BENADRYL ) 50 MG tablet Take 50 mg by mouth at bedtime.   ezetimibe  (ZETIA ) 10 MG tablet TAKE 1 TABLET EVERY DAY   lisinopril  (ZESTRIL ) 10 MG tablet TAKE 1 TABLET EVERY DAY   metoprolol  succinate (TOPROL -XL) 25 MG 24 hr tablet Take 1 tablet (25 mg total) by mouth daily.   nitroGLYCERIN  (NITROSTAT ) 0.4 MG SL tablet DISSOLVE 1 TABLET UNDER TONGUE EVERY 5 MIN AS NEED FOR CHEST PAIN. CALL 911 IF NO RELIEF AFTER 3DOSE   pantoprazole  (PROTONIX ) 40 MG tablet Take 40 mg by mouth daily.   timolol  (TIMOPTIC ) 0.5 % ophthalmic solution Place 1 drop into both eyes 2 (two) times daily.   [DISCONTINUED] traMADol  (ULTRAM ) 50 MG tablet Take 1-2 tablets (50-100 mg total) by mouth every 6 (six) hours as needed for moderate pain (pain score 4-6) or severe pain (pain score 7-10).   No facility-administered medications prior to visit.   "

## 2024-11-06 NOTE — Progress Notes (Signed)
 "    Subjective:   Patrick Barrera is a 69 y.o. male who presents for a Medicare Annual Wellness Visit.  Visit info / Clinical Intake: Interpreter Needed?: No  Fall Screening Falls in the past year?: 0 Number of falls in past year: 0 Was there an injury with Fall?: 0 Fall Risk Category Calculator: 0 Patient Fall Risk Level: High fall risk  Fall Risk Patient at Risk for Falls Due to: No Fall Risks  Cognitive Assessment Was the patient able to repeat memory words in 3 tries?: yes Which version was used?: Version 4 : river, nation, finger Clock numbers correct?: yes Clock time correct (11:10)?: yes Normal clock drawing test?: 2 How many words correct?: 3 Which version was used?: Version 4: river, nation, finger Mini-Cog Scoring: 5 What year is it?: 0 points Give patient an address phrase to remember (5 components): [address] [place] , [place] About what time is it?: 0 points Count backwards from 20 to 1: 0 points Say the months of the year in reverse: 0 points Repeat the address phrase from earlier: 0 points  Advance Directives (For Healthcare) Does Patient Have a Medical Advance Directive?: No Would patient like information on creating a medical advance directive?: No - Patient declined    Allergies (verified) Codeine, Imdur  [isosorbide  nitrate], and Simvastatin   Current Medications (verified) Outpatient Encounter Medications as of 11/06/2024  Medication Sig   acetaminophen  (TYLENOL ) 500 MG tablet Take 1,000 mg by mouth every 6 (six) hours as needed for moderate pain or headache.    aspirin  EC 81 MG tablet Take 1 tablet (81 mg total) by mouth daily. Swallow whole.   atorvastatin  (LIPITOR ) 80 MG tablet TAKE 1 TABLET EVERY DAY   diphenhydrAMINE  (BENADRYL ) 50 MG tablet Take 50 mg by mouth at bedtime.   ezetimibe  (ZETIA ) 10 MG tablet TAKE 1 TABLET EVERY DAY   lisinopril  (ZESTRIL ) 10 MG tablet TAKE 1 TABLET EVERY DAY   metoprolol  succinate (TOPROL -XL) 25 MG 24 hr tablet  Take 1 tablet (25 mg total) by mouth daily.   nitroGLYCERIN  (NITROSTAT ) 0.4 MG SL tablet DISSOLVE 1 TABLET UNDER TONGUE EVERY 5 MIN AS NEED FOR CHEST PAIN. CALL 911 IF NO RELIEF AFTER 3DOSE   pantoprazole  (PROTONIX ) 40 MG tablet Take 40 mg by mouth daily.   timolol  (TIMOPTIC ) 0.5 % ophthalmic solution Place 1 drop into both eyes 2 (two) times daily.   [DISCONTINUED] traMADol  (ULTRAM ) 50 MG tablet Take 1-2 tablets (50-100 mg total) by mouth every 6 (six) hours as needed for moderate pain (pain score 4-6) or severe pain (pain score 7-10).   No facility-administered encounter medications on file as of 11/06/2024.    History: Past Medical History:  Diagnosis Date   Abdominal bruit    Acute MI, inferoposterior wall (HCC) 08/15/2006   a.) LHC/PCI 08/15/2006 --> MV CAD with IRA being 100% pRCA (3.0 x 12 mm ACS Multi-Link Rx Vision BMS and 3.0 x 28 mm Multi-Link OTW Vision BMS)   Allergy Na   Aneurysm of common iliac artery (BILATERAL)    Arthritis    Atherosclerosis of aorta    Barrett esophagus    Complex tear of lateral meniscus of right knee as current injury 03/20/2019   COPD (chronic obstructive pulmonary disease) (HCC)    Coronary artery disease    Cutaneous lipodystrophy    DDD (degenerative disc disease), cervical    a.) s/p ACDF C4-C5   Dental bridge present (lower anterior)    Diverticulosis  Diverticulosis of colon    DOE (dyspnea on exertion)    ED (erectile dysfunction) of organic origin    GERD (gastroesophageal reflux disease)    Hepatic steatosis    History of adenomatous polyp of colon    History of Barrett's esophagus    History of rectal bleeding    HLD (hyperlipidemia)    Hyperplastic colon polyp    Hypertension    Infrarenal abdominal aortic aneurysm (AAA) without rupture    Long-term use of aspirin  therapy    Low testosterone  in male    Lung nodule    Peripheral artery disease    PONV (postoperative nausea and vomiting)    Status post bilateral cataract  extraction 2024   Past Surgical History:  Procedure Laterality Date   ANTERIOR CERVICAL DECOMP/DISCECTOMY FUSION N/A 09/06/2020   Procedure: Anterior Cervical Decompression Discectomy Fusion Cervical four-five;  Surgeon: Louis Shove, MD;  Location: [facility] OR;  Service: Neurosurgery;  Laterality: N/A;   APPENDECTOMY N/A 1982   BACK SURGERY     BICEPT TENODESIS Left 06/02/2024   Procedure: TENODESIS, BICEPS;  Surgeon: Edie Norleen PARAS, MD;  Location: [facility] ORS;  Service: Orthopedics;  Laterality: Left;   CARPAL TUNNEL RELEASE Left 09/22/2018   Procedure: CARPAL TUNNEL RELEASE;  Surgeon: Mardee Lynwood SQUIBB, MD;  Location: [facility] ORS;  Service: Orthopedics;  Laterality: Left;   CATARACT EXTRACTION W/PHACO Right 01/02/2023   Procedure: CATARACT EXTRACTION PHACO AND INTRAOCULAR LENS PLACEMENT (IOC) RIGHT;  Surgeon: Mittie Gaskin, MD;  Location: [facility];  Service: Ophthalmology;  Laterality: Right;  9.73 1:06.4   CATARACT EXTRACTION W/PHACO Left 06/05/2023   Procedure: CATARACT EXTRACTION PHACO AND INTRAOCULAR LENS PLACEMENT (IOC) LEFT CLAREON TORIC 8.54 00:48.8;  Surgeon: Mittie Gaskin, MD;  Location: [facility];  Service: Ophthalmology;  Laterality: Left;   CHONDROPLASTY Right 05/11/2019   Procedure: CHONDROPLASTY;  Surgeon: Mardee Lynwood SQUIBB, MD;  Location: [facility] ORS;  Service: Orthopedics;  Laterality: Right;   COLONOSCOPY WITH PROPOFOL  N/A 11/08/2017   Procedure: COLONOSCOPY WITH PROPOFOL ;  Surgeon: Viktoria Lamar DASEN, MD;  Location: [facility] ENDOSCOPY;  Service: Endoscopy;  Laterality: N/A;   COLONOSCOPY WITH PROPOFOL  N/A 01/06/2024   Procedure: COLONOSCOPY WITH PROPOFOL ;  Surgeon: Onita Elspeth Sharper, DO;  Location: [facility] ENDOSCOPY;  Service: Gastroenterology;  Laterality: N/A;   CORONARY ANGIOPLASTY WITH STENT PLACEMENT Left 08/15/2006   Procedure: CORONARY ANGIOPLASTY WITH STENT PLACEMENT; Location: [facility]; Surgeon: Sharper Estelle, MD   ESOPHAGOGASTRODUODENOSCOPY N/A  11/20/2021   Procedure: ESOPHAGOGASTRODUODENOSCOPY (EGD);  Surgeon: Onita Elspeth Sharper, DO;  Location: [facility] ENDOSCOPY;  Service: Gastroenterology;  Laterality: N/A;   ESOPHAGOGASTRODUODENOSCOPY (EGD) WITH PROPOFOL  N/A 11/08/2017   Procedure: ESOPHAGOGASTRODUODENOSCOPY (EGD) WITH PROPOFOL ;  Surgeon: Viktoria Lamar DASEN, MD;  Location: [facility] ENDOSCOPY;  Service: Endoscopy;  Laterality: N/A;   ESOPHAGOGASTRODUODENOSCOPY (EGD) WITH PROPOFOL  N/A 01/06/2024   Procedure: ESOPHAGOGASTRODUODENOSCOPY (EGD) WITH PROPOFOL ;  Surgeon: Onita Elspeth Sharper, DO;  Location: [facility] ENDOSCOPY;  Service: Gastroenterology;  Laterality: N/A;   GANGLION CYST EXCISION Right    KNEE ARTHROSCOPY Right 05/11/2019   Procedure: ARTHROSCOPY KNEE- partial medial/lateral menisectomy ;  Surgeon: Mardee Lynwood SQUIBB, MD;  Location: [facility] ORS;  Service: Orthopedics;  Laterality: Right;   POLYPECTOMY  01/06/2024   Procedure: POLYPECTOMY;  Surgeon: Onita Elspeth Sharper, DO;  Location: [facility] ENDOSCOPY;  Service: Gastroenterology;;   REVERSE SHOULDER ARTHROPLASTY Left 06/02/2024   Procedure: ARTHROPLASTY, SHOULDER, TOTAL, REVERSE;  Surgeon: Edie Norleen PARAS, MD;  Location: [facility] ORS;  Service: Orthopedics;  Laterality: Left;   SPINE SURGERY  09/06/2020  TONSILLECTOMY AND ADENOIDECTOMY     TOTAL KNEE ARTHROPLASTY Left 12/13/2021   Procedure: ROBOT ASSISTED LEFT TOTAL KNEE ARTHROPLASTY; Surgeon: Chrys Dene Bailey, MD; Location: [facility] OR; Service: Orthopedics; Laterality: Left;   UPPER GI ENDOSCOPY  04/23/2014   barretts esophagus, repeat 04/2017   Family History  Problem Relation Age of Onset   Heart disease Father        Stents placed   Pulmonary fibrosis Father    Arthritis Father    Social History   Occupational History   Occupation: MACHINE OPERATOR    Employer: [employer]    Comment: Full time  Tobacco Use   Smoking status: Former    Current packs/day: 0.00    Average packs/day: 1 pack/day for 45.0 years (45.0 ttl  pk-yrs)    Types: Cigarettes, E-cigarettes    Start date: 07/13/1967    Quit date: 07/12/2012    Years since quitting: 12.3   Smokeless tobacco: Never   Tobacco comments:    changed from cigarettes to Vaping in 2013. Previousl 1.5 ppd since 12  Vaping Use   Vaping status: Every Day   Start date: 10/09/2011   Substances: Nicotine  Substance and Sexual Activity   Alcohol use: Yes    Alcohol/week: 1.0 standard drink of alcohol    Types: 1 Standard drinks or equivalent per week    Comment: moderate   Drug use: No   Sexual activity: Not on file   Tobacco Counseling Counseling given: Not Answered Tobacco comments: changed from cigarettes to Vaping in 2013. Previousl 1.5 ppd since 12  SDOH Screenings   Food Insecurity: No Food Insecurity (11/02/2024)  Housing: Low Risk (11/02/2024)  Transportation Needs: No Transportation Needs (11/02/2024)  Utilities: Not At Risk (08/31/2024)   Received from [facility]  Alcohol Screen: Low Risk (11/02/2024)  Depression (PHQ2-9): Low Risk (11/06/2024)  Financial Resource Strain: Low Risk (11/02/2024)  Physical Activity: Inactive (11/02/2024)  Social Connections: Moderately Isolated (11/02/2024)  Stress: No Stress Concern Present (11/02/2024)  Tobacco Use: Medium Risk (11/06/2024)   See flowsheets for full screening details  Depression Screen PHQ 2 & 9 Depression Scale- Over the past 2 weeks, how often have you been bothered by any of the following problems? Little interest or pleasure in doing things: 0 Feeling down, depressed, or hopeless (PHQ Adolescent also includes...irritable): 0 PHQ-2 Total Score: 0 Trouble falling or staying asleep, or sleeping too much: 0 Feeling tired or having little energy: 0 Poor appetite or overeating (PHQ Adolescent also includes...weight loss): 0 Feeling bad about yourself - or that you are a failure or have let yourself or your family down: 0 Trouble concentrating on things, such as reading the  newspaper or watching television (PHQ Adolescent also includes...like school work): 0 Moving or speaking so slowly that other people could have noticed. Or the opposite - being so fidgety or restless that you have been moving around a lot more than usual: 0 Thoughts that you would be better off dead, or of hurting yourself in some way: 0 PHQ-9 Total Score: 0 If you checked off any problems, how difficult have these problems made it for you to do your work, take care of things at home, or get along with other people?: Not difficult at all     Goals Addressed   None          Objective:    Today's Vitals   11/06/24 0824  BP: 138/73  Resp: 16  SpO2: 98%  Weight:  243 lb (110.2 kg)  Height: 5' 9 (1.753 m)   Body mass index is 35.88 kg/m.  Hearing/Vision screen No results found. Immunizations and Health Maintenance Health Maintenance  Topic Date Due   Diabetic kidney evaluation - Urine ACR  Never done   Zoster Vaccines- Shingrix  (2 of 2) 08/31/2019   COVID-19 Vaccine (1 - 2025-26 season) Never done   Medicare Annual Wellness (AWV)  11/04/2024   Lung Cancer Screening  04/20/2025   Diabetic kidney evaluation - eGFR measurement  05/26/2025   DTaP/Tdap/Td (3 - Td or Tdap) 02/19/2028   Colonoscopy  01/05/2029   Pneumococcal Vaccine: 50+ Years  Completed   Influenza Vaccine  Completed   Hepatitis C Screening  Completed   Meningococcal B Vaccine  Aged Out        Assessment/Plan:  This is a routine wellness examination for Patrick Barrera.  Patient Care Team: Gasper Nancyann BRAVO, MD as PCP - General (Family Medicine) Perla Evalene PARAS, MD as PCP - Cardiology (Cardiology) Perla Evalene PARAS, MD as Consulting Physician (Cardiology) Louis Shove, MD as Consulting Physician (Neurosurgery) Hallows, Dene POUR, MD (Orthopedic Surgery) Maryruth Ole DASEN, MD as Consulting Physician (Gastroenterology)  I have personally reviewed and noted the following in the patients chart:   Medical and  social history Use of alcohol, tobacco or illicit drugs  Current medications and supplements including opioid prescriptions. Functional ability and status Nutritional status Physical activity Advanced directives List of other physicians Hospitalizations, surgeries, and ER visits in previous 12 months Vitals Screenings to include cognitive, depression, and falls Referrals and appointments  Orders Placed This Encounter  Procedures   Flu vaccine HIGH DOSE PF(Fluzone Trivalent)   Urine Albumin/Creatinine with ratio (send out) [LAB689]   Comprehensive Metabolic Panel (CMET)   Lipid panel   HgB A1c   CBC   PSA Total (Reflex To Free)   In addition, I have reviewed and discussed with patient certain preventive protocols, quality metrics, and best practice recommendations. A written personalized care plan for preventive services as well as general preventive health recommendations were provided to patient.   Nancyann Gasper, MD   11/06/2024    After Visit Summary: (In Person-Printed) AVS printed and given to the patient   "

## 2024-11-06 NOTE — Patient Instructions (Signed)
 SABRA  Please review the attached list of medications and notify my office if there are any errors.   . Please bring all of your medications to every appointment so we can make sure that our medication list is the same as yours.

## 2024-11-07 LAB — COMPREHENSIVE METABOLIC PANEL WITH GFR
ALT: 45 [IU]/L — ABNORMAL HIGH (ref 0–44)
AST: 36 [IU]/L (ref 0–40)
Albumin: 4 g/dL (ref 3.9–4.9)
Alkaline Phosphatase: 118 [IU]/L (ref 47–123)
BUN/Creatinine Ratio: 17 (ref 10–24)
BUN: 20 mg/dL (ref 8–27)
Bilirubin Total: 0.5 mg/dL (ref 0.0–1.2)
CO2: 20 mmol/L (ref 20–29)
Calcium: 9.3 mg/dL (ref 8.6–10.2)
Chloride: 103 mmol/L (ref 96–106)
Creatinine, Ser: 1.17 mg/dL (ref 0.76–1.27)
Globulin, Total: 3.5 g/dL (ref 1.5–4.5)
Glucose: 144 mg/dL — ABNORMAL HIGH (ref 70–99)
Potassium: 4.7 mmol/L (ref 3.5–5.2)
Sodium: 136 mmol/L (ref 134–144)
Total Protein: 7.5 g/dL (ref 6.0–8.5)
eGFR: 67 mL/min/{1.73_m2}

## 2024-11-07 LAB — MICROALBUMIN / CREATININE URINE RATIO
Creatinine, Urine: 86.6 mg/dL
Microalb/Creat Ratio: 8 mg/g{creat} (ref 0–29)
Microalbumin, Urine: 7.1 ug/mL

## 2024-11-07 LAB — CBC
Hematocrit: 47.8 % (ref 37.5–51.0)
Hemoglobin: 15.5 g/dL (ref 13.0–17.7)
MCH: 32.2 pg (ref 26.6–33.0)
MCHC: 32.4 g/dL (ref 31.5–35.7)
MCV: 99 fL — ABNORMAL HIGH (ref 79–97)
Platelets: 251 10*3/uL (ref 150–450)
RBC: 4.81 x10E6/uL (ref 4.14–5.80)
RDW: 12.9 % (ref 11.6–15.4)
WBC: 6.5 10*3/uL (ref 3.4–10.8)

## 2024-11-07 LAB — LIPID PANEL
Chol/HDL Ratio: 5.5 ratio — ABNORMAL HIGH (ref 0.0–5.0)
Cholesterol, Total: 137 mg/dL (ref 100–199)
HDL: 25 mg/dL — ABNORMAL LOW
LDL Chol Calc (NIH): 66 mg/dL (ref 0–99)
Triglycerides: 287 mg/dL — ABNORMAL HIGH (ref 0–149)
VLDL Cholesterol Cal: 46 mg/dL — ABNORMAL HIGH (ref 5–40)

## 2024-11-07 LAB — HEMOGLOBIN A1C
Est. average glucose Bld gHb Est-mCnc: 171 mg/dL
Hgb A1c MFr Bld: 7.6 % — ABNORMAL HIGH (ref 4.8–5.6)

## 2024-11-07 LAB — PSA TOTAL (REFLEX TO FREE): Prostate Specific Ag, Serum: 0.7 ng/mL (ref 0.0–4.0)

## 2024-11-08 ENCOUNTER — Ambulatory Visit: Payer: Self-pay | Admitting: Family Medicine

## 2024-11-08 DIAGNOSIS — E119 Type 2 diabetes mellitus without complications: Secondary | ICD-10-CM

## 2024-11-08 MED ORDER — METFORMIN HCL ER 500 MG PO TB24: 500.0000 mg | ORAL_TABLET | Freq: Every day | ORAL | 3 refills | Status: DC

## 2024-11-09 MED ORDER — SITAGLIPTIN PHOSPHATE 100 MG PO TABS: 100.0000 mg | ORAL_TABLET | Freq: Every day | ORAL | 3 refills | Status: AC

## 2024-11-10 NOTE — Telephone Encounter (Signed)
Patient advised of Rx 

## 2024-11-12 ENCOUNTER — Other Ambulatory Visit: Payer: Self-pay | Admitting: Cardiovascular Disease

## 2024-11-23 ENCOUNTER — Ambulatory Visit: Admitting: Cardiovascular Disease

## 2025-11-08 ENCOUNTER — Encounter: Admitting: Family Medicine
